# Patient Record
Sex: Male | Born: 1960 | Race: Asian | Hispanic: No | Marital: Married | Smoking: Current every day smoker
Health system: Southern US, Community
[De-identification: ages and names within clinical notes are randomized; demographics above are authoritative.]

## PROBLEM LIST (undated history)

## (undated) DIAGNOSIS — I10 Essential (primary) hypertension: Secondary | ICD-10-CM

## (undated) HISTORY — DX: Essential (primary) hypertension: I10

---

## 2011-05-15 ENCOUNTER — Ambulatory Visit: Payer: Self-pay | Admitting: Internal Medicine

## 2011-05-15 VITALS — BP 142/82 | HR 88 | Temp 98.5°F | Resp 16 | Ht 64.0 in | Wt 125.2 lb

## 2011-05-15 DIAGNOSIS — G250 Essential tremor: Secondary | ICD-10-CM

## 2011-05-15 DIAGNOSIS — M542 Cervicalgia: Secondary | ICD-10-CM

## 2011-05-15 DIAGNOSIS — G252 Other specified forms of tremor: Secondary | ICD-10-CM

## 2011-05-15 MED ORDER — CYCLOBENZAPRINE HCL 10 MG PO TABS: 10.0000 mg | ORAL_TABLET | Freq: Three times a day (TID) | ORAL | Status: AC | PRN

## 2011-05-15 MED ORDER — PREDNISONE 20 MG PO TABS: ORAL_TABLET | ORAL | Status: DC

## 2011-05-15 NOTE — Progress Notes (Signed)
  Subjective:    Patient ID: Gregory Singh, male    DOB: 1961-03-16, 51 y.o.   MRN: [id number]  HPI  Neck strain  Review of Systems     Objective:   Physical Exam Pain neck and thorax       Assessment & Plan:  6 weeks of m-s pain  Prednisone and flexeril Neck care manual RTC prn will xray

## 2011-05-21 ENCOUNTER — Telehealth: Payer: Self-pay

## 2011-05-21 ENCOUNTER — Ambulatory Visit: Payer: Self-pay

## 2011-05-21 ENCOUNTER — Ambulatory Visit: Payer: Self-pay | Admitting: Internal Medicine

## 2011-05-21 DIAGNOSIS — M159 Polyosteoarthritis, unspecified: Secondary | ICD-10-CM

## 2011-05-21 DIAGNOSIS — M25519 Pain in unspecified shoulder: Secondary | ICD-10-CM

## 2011-05-21 DIAGNOSIS — R202 Paresthesia of skin: Secondary | ICD-10-CM

## 2011-05-21 DIAGNOSIS — M542 Cervicalgia: Secondary | ICD-10-CM

## 2011-05-21 DIAGNOSIS — IMO0002 Reserved for concepts with insufficient information to code with codable children: Secondary | ICD-10-CM

## 2011-05-21 DIAGNOSIS — F809 Developmental disorder of speech and language, unspecified: Secondary | ICD-10-CM

## 2011-05-21 MED ORDER — HYDROCODONE-ACETAMINOPHEN 10-325 MG PO TABS: 1.0000 | ORAL_TABLET | Freq: Three times a day (TID) | ORAL | Status: AC | PRN

## 2011-05-21 NOTE — Telephone Encounter (Signed)
None

## 2011-05-21 NOTE — Progress Notes (Signed)
  Subjective:    Patient ID: Gregory Singh, male    DOB: [birth year], 51 y.o.   MRN: [id number]  HPI Right neck pain with radiation and numbness right hand. No improvement yet on prednisone. No weakness   Review of Systems    stable Objective:   Physical Exam  Constitutional: He is oriented to person, place, and time. He appears well-developed and well-nourished.  Cardiovascular: Normal rate and regular rhythm.   Pulmonary/Chest: Effort normal and breath sounds normal.  Musculoskeletal:       Right shoulder: Normal.       Cervical back: He exhibits decreased range of motion, tenderness and pain.  Neurological: He is alert and oriented to person, place, and time. He has normal strength. No sensory deficit.  Reflex Scores:      Tricep reflexes are 2+ on the right side and 2+ on the left side.      Bicep reflexes are 1+ on the right side and 1+ on the left side.    [facility] reading (PRIMARY) by  Dr. Perrin Maltese C-spine. Moderate to severe DDD and spondylosis C-5-6-7.        Assessment & Plan:   Progressive neck pain with right arm/hand pain /numbness.  Finish prednisone Add vicodin 7.5/325 40, 1 QID prn F/up 1-2 weeks

## 2011-05-29 ENCOUNTER — Ambulatory Visit: Payer: Self-pay | Admitting: Internal Medicine

## 2011-05-29 VITALS — BP 138/88 | HR 106 | Temp 98.6°F | Resp 16 | Wt 127.2 lb

## 2011-05-29 DIAGNOSIS — S139XXA Sprain of joints and ligaments of unspecified parts of neck, initial encounter: Secondary | ICD-10-CM

## 2011-05-29 DIAGNOSIS — M542 Cervicalgia: Secondary | ICD-10-CM

## 2011-05-29 MED ORDER — CYCLOBENZAPRINE HCL 10 MG PO TABS: 10.0000 mg | ORAL_TABLET | Freq: Three times a day (TID) | ORAL | Status: AC | PRN

## 2011-05-29 NOTE — Progress Notes (Signed)
  Subjective:    Patient ID: Gregory Singh, male    DOB: [birth year], 51 y.o.   MRN: [id number]  HPI  Neck stain 50% better  Review of Systems     Objective:   Physical Exam  Improved ROM      Assessment & Plan:   Refilled flexeril  Ok if calls to refill vicodin 10/325 or prednisone one more time. No insurance and language issurs

## 2015-01-03 ENCOUNTER — Ambulatory Visit (INDEPENDENT_AMBULATORY_CARE_PROVIDER_SITE_OTHER): Payer: No Typology Code available for payment source | Admitting: Internal Medicine

## 2015-01-03 ENCOUNTER — Ambulatory Visit (INDEPENDENT_AMBULATORY_CARE_PROVIDER_SITE_OTHER): Payer: No Typology Code available for payment source

## 2015-01-03 VITALS — BP 158/88 | HR 81 | Temp 98.3°F | Resp 16 | Ht 66.0 in | Wt 130.0 lb

## 2015-01-03 DIAGNOSIS — R0602 Shortness of breath: Secondary | ICD-10-CM | POA: Diagnosis not present

## 2015-01-03 DIAGNOSIS — R350 Frequency of micturition: Secondary | ICD-10-CM | POA: Diagnosis not present

## 2015-01-03 DIAGNOSIS — Z720 Tobacco use: Secondary | ICD-10-CM | POA: Diagnosis not present

## 2015-01-03 DIAGNOSIS — R21 Rash and other nonspecific skin eruption: Secondary | ICD-10-CM

## 2015-01-03 DIAGNOSIS — F17200 Nicotine dependence, unspecified, uncomplicated: Secondary | ICD-10-CM

## 2015-01-03 DIAGNOSIS — M4712 Other spondylosis with myelopathy, cervical region: Secondary | ICD-10-CM | POA: Diagnosis not present

## 2015-01-03 DIAGNOSIS — I10 Essential (primary) hypertension: Secondary | ICD-10-CM | POA: Diagnosis not present

## 2015-01-03 DIAGNOSIS — R251 Tremor, unspecified: Secondary | ICD-10-CM

## 2015-01-03 LAB — COMPREHENSIVE METABOLIC PANEL
ALK PHOS: 90 U/L (ref 40–115)
ALT: 87 U/L — AB (ref 9–46)
AST: 131 U/L — AB (ref 10–35)
Albumin: 4.6 g/dL (ref 3.6–5.1)
BILIRUBIN TOTAL: 1.2 mg/dL (ref 0.2–1.2)
BUN: 8 mg/dL (ref 7–25)
CO2: 22 mmol/L (ref 20–31)
CREATININE: 0.71 mg/dL (ref 0.70–1.33)
Calcium: 9.5 mg/dL (ref 8.6–10.3)
Chloride: 106 mmol/L (ref 98–110)
GLUCOSE: 108 mg/dL — AB (ref 65–99)
Potassium: 3.9 mmol/L (ref 3.5–5.3)
Sodium: 140 mmol/L (ref 135–146)
TOTAL PROTEIN: 7.7 g/dL (ref 6.1–8.1)

## 2015-01-03 LAB — LIPID PANEL
CHOL/HDL RATIO: 5 ratio (ref <=5.0)
CHOLESTEROL: 179 mg/dL (ref 125–200)
HDL: 36 mg/dL — AB (ref >=40)
Triglycerides: 517 mg/dL — ABNORMAL HIGH (ref <150)

## 2015-01-03 LAB — POCT CBC
GRANULOCYTE PERCENT: 56.3 % (ref 37–80)
HEMATOCRIT: 44.6 % (ref 43.5–53.7)
HEMOGLOBIN: 16.2 g/dL (ref 14.1–18.1)
Lymph, poc: 2.3 (ref 0.6–3.4)
MCH, POC: 35.5 pg — AB (ref 27–31.2)
MCHC: 36.5 g/dL — AB (ref 31.8–35.4)
MCV: 97.3 fL — AB (ref 80–97)
MID (CBC): 0.4 (ref 0–0.9)
MPV: 8.2 fL (ref 0–99.8)
POC Granulocyte: 3.4 (ref 2–6.9)
POC LYMPH PERCENT: 37.2 %L (ref 10–50)
POC MID %: 6.5 % (ref 0–12)
Platelet Count, POC: 104 10*3/uL — AB (ref 142–424)
RBC: 4.58 M/uL — AB (ref 4.69–6.13)
RDW, POC: 13.2 %
WBC: 6.1 10*3/uL (ref 4.6–10.2)

## 2015-01-03 LAB — POCT URINALYSIS DIP (MANUAL ENTRY)
BILIRUBIN UA: NEGATIVE
Bilirubin, UA: NEGATIVE
GLUCOSE UA: NEGATIVE
Leukocytes, UA: NEGATIVE
Nitrite, UA: NEGATIVE
Protein Ur, POC: NEGATIVE
RBC UA: NEGATIVE
SPEC GRAV UA: 1.02
UROBILINOGEN UA: 1
pH, UA: 6

## 2015-01-03 LAB — POC MICROSCOPIC URINALYSIS (UMFC)

## 2015-01-03 LAB — HEPATITIS C ANTIBODY: HCV Ab: NEGATIVE

## 2015-01-03 LAB — TSH: TSH: 2.276 u[IU]/mL (ref 0.350–4.500)

## 2015-01-03 LAB — POCT SEDIMENTATION RATE: POCT SED RATE: 6 mm/hr (ref 0–22)

## 2015-01-03 LAB — VITAMIN B12: VITAMIN B 12: 532 pg/mL (ref 211–911)

## 2015-01-03 MED ORDER — CLOBETASOL PROPIONATE 0.05 % EX OINT: TOPICAL_OINTMENT | CUTANEOUS | Status: DC

## 2015-01-03 MED ORDER — METOPROLOL SUCCINATE ER 50 MG PO TB24: 50.0000 mg | ORAL_TABLET | Freq: Every day | ORAL | Status: DC

## 2015-01-03 NOTE — Patient Instructions (Addendum)
Smoking Cessation, Tips for Success If you are ready to quit smoking, congratulations! You have chosen to help yourself be healthier. Cigarettes bring nicotine, tar, carbon monoxide, and other irritants into your body. Your lungs, heart, and blood vessels will be able to work better without these poisons. There are many different ways to quit smoking. Nicotine gum, nicotine patches, a nicotine inhaler, or nicotine nasal spray can help with physical craving. Hypnosis, support groups, and medicines help break the habit of smoking. WHAT THINGS CAN I DO TO MAKE QUITTING EASIER?  Here are some tips to help you quit for good:  Pick a date when you will quit smoking completely. Tell all of your friends and family about your plan to quit on that date.  Do not try to slowly cut down on the number of cigarettes you are smoking. Pick a quit date and quit smoking completely starting on that day.  Throw away all cigarettes.   Clean and remove all ashtrays from your home, work, and car.  On a card, write down your reasons for quitting. Carry the card with you and read it when you get the urge to smoke.  Cleanse your body of nicotine. Drink enough water and fluids to keep your urine clear or pale yellow. Do this after quitting to flush the nicotine from your body.  Learn to predict your moods. Do not let a bad situation be your excuse to have a cigarette. Some situations in your life might tempt you into wanting a cigarette.  Never have "just one" cigarette. It leads to wanting another and another. Remind yourself of your decision to quit.  Change habits associated with smoking. If you smoked while driving or when feeling stressed, try other activities to replace smoking. Stand up when drinking your coffee. Brush your teeth after eating. Sit in a different chair when you read the paper. Avoid alcohol while trying to quit, and try to drink fewer caffeinated beverages. Alcohol and caffeine may urge you to  smoke.  Avoid foods and drinks that can trigger a desire to smoke, such as sugary or spicy foods and alcohol.  Ask people who smoke not to smoke around you.  Have something planned to do right after eating or having a cup of coffee. For example, plan to take a walk or exercise.  Try a relaxation exercise to calm you down and decrease your stress. Remember, you may be tense and nervous for the first 2 weeks after you quit, but this will pass.  Find new activities to keep your hands busy. Play with a pen, coin, or rubber band. Doodle or draw things on paper.  Brush your teeth right after eating. This will help cut down on the craving for the taste of tobacco after meals. You can also try mouthwash.   Use oral substitutes in place of cigarettes. Try using lemon drops, carrots, cinnamon sticks, or chewing gum. Keep them handy so they are available when you have the urge to smoke.  When you have the urge to smoke, try deep breathing.  Designate your home as a nonsmoking area.  If you are a heavy smoker, ask your health care provider about a prescription for nicotine chewing gum. It can ease your withdrawal from nicotine.  Reward yourself. Set aside the cigarette money you save and buy yourself something nice.  Look for support from others. Join a support group or smoking cessation program. Ask someone at home or at work to help you with your plan   to quit smoking.  Always ask yourself, "Do I need this cigarette or is this just a reflex?" Tell yourself, "Today, I choose not to smoke," or "I do not want to smoke." You are reminding yourself of your decision to quit.  Do not replace cigarette smoking with electronic cigarettes (commonly called e-cigarettes). The safety of e-cigarettes is unknown, and some may contain harmful chemicals.  If you relapse, do not give up! Plan ahead and think about what you will do the next time you get the urge to smoke. HOW WILL I FEEL WHEN I QUIT SMOKING? You  may have symptoms of withdrawal because your body is used to nicotine (the addictive substance in cigarettes). You may crave cigarettes, be irritable, feel very hungry, cough often, get headaches, or have difficulty concentrating. The withdrawal symptoms are only temporary. They are strongest when you first quit but will go away within 10-14 days. When withdrawal symptoms occur, stay in control. Think about your reasons for quitting. Remind yourself that these are signs that your body is healing and getting used to being without cigarettes. Remember that withdrawal symptoms are easier to treat than the major diseases that smoking can cause.  Even after the withdrawal is over, expect periodic urges to smoke. However, these cravings are generally short lived and will go away whether you smoke or not. Do not smoke! WHAT RESOURCES ARE AVAILABLE TO HELP ME QUIT SMOKING? Your health care provider can direct you to community resources or hospitals for support, which may include:  Group support.  Education.  Hypnosis.  Therapy.   This information is not intended to replace advice given to you by your health care provider. Make sure you discuss any questions you have with your health care provider.   Document Released: 12/02/2003 Document Revised: 03/26/2014 Document Reviewed: 08/21/2012 Elsevier Interactive Patient Education 2016 [employer]. Compensation Strategies for Tremors  When eating, try the following  Eat out of bowls, divided plates, or use a plate guard (available at a medical supply store) and eat with a spoon so that you have an edge to scoop up food.  Try raising your plate so that there is less distance between the plate and mouth.Try stabilizing elbows on the tables or against your body.  Use utensil with built-up/larger grips as they are easier to hold.  When writing, try the following:  Stabilize forearm on the table.  Take your time as rushing/being stressed can increase  tremors.  Try a felt-tipped pen, it does not glide as much.  Avoid gel pens ( they move to much ).  Consider using pre-printed labels with your name and address (carry them with you when you go out) or you can get stamps with your address or signature on it.  Use a small tape recorder to record messages/reminders for yourself.  Use pens with bigger grips.  When brushing your teeth, putting on make-up, or styling hair, try the following:  Use an electric toothbrush.  Use items with built-up grips.  Stabilize your elbows against your body or on the counter.  Use long-handled brushes/combs.  Use a hair dryer with a stand.  In general:  Avoid stress, fatigue or rushing as this can increase tremors.  Sit down for activities that require more control/coordination.  Perform "flicks". Tremor A tremor is trembling or shaking that you cannot control. Most tremors affect the hands or arms. Tremors can also affect the head, vocal cords, face, and other parts of the body. There are many types  of tremors. Common types include:   Essential tremor. These usually occur in people over the age of 52. It may run in families and can happen in otherwise healthy people.   Resting tremor. These occur when the muscles are at rest, such as when your hands are resting in your lap. People with Parkinson disease often have resting tremors.   Postural tremor. These occur when you try to hold a pose, such as keeping your hands outstretched.   Kinetic tremor. These occur during purposeful movement, such as trying to touch a finger to your nose.   Task-specific tremor. These may occur when you perform tasks such as handwriting, speaking, or standing.   Psychogenic tremor. These dramatically lessen or disappear when you are distracted. They can happen in people of all ages.  Some types of tremors have no known cause. Tremors can also be a symptom of nervous system problems (neurological disorders)  that may occur with aging. Some tremors go away with treatment while others do not.  HOME CARE INSTRUCTIONS Watch your tremor for any changes. The following actions may help to lessen any discomfort you are feeling:  Take medicines only as directed by your health care provider.   Limit alcohol intake to no more than 1 drink per day for nonpregnant women and 2 drinks per day for men. One drink equals 12 oz of beer, 5 oz of wine, or 1 oz of hard liquor.  Do not use any tobacco products, including cigarettes, chewing tobacco, or electronic cigarettes. If you need help quitting, ask your health care provider.   Avoid extreme heat or cold.   Limit the amount of caffeine you consumeas directed by your health care provider.   Try to get 8 hours of sleep each night.  Find ways to manage your stress, such as meditation or yoga.  Keep all follow-up visits as directed by your health care provider. This is important. SEEK MEDICAL CARE IF:  You start having a tremor after starting a new medicine.  You have tremor with other symptoms such as:  Numbness.  Tingling.  Pain.  Weakness.  Your tremor gets worse.  Your tremor interferes with your day-to-day life.   This information is not intended to replace advice given to you by your health care provider. Make sure you discuss any questions you have with your health care provider.   Document Released: 02/23/2002 Document Revised: 03/26/2014 Document Reviewed: 08/31/2013 Elsevier Interactive Patient Education 2016 Else[employer] Inc[employer] DASH Eating Plan DASH stands for "Dietary Approaches to Stop Hypertension." The DASH eating plan is a healthy eating plan that has been shown to reduce high blood pressure (hypertension). Additional health benefits may include reducing the risk of type 2 diabetes mellitus, heart disease, and stroke. The DASH eating plan may also help with weight loss. WHAT DO I NEED TO KNOW ABOUT THE DASH EATING PLAN? For the  DASH eating plan, you will follow these general guidelines:  Choose foods with a percent daily value for sodium of less than 5% (as listed on the food label).  Use salt-free seasonings or herbs instead of table salt or sea salt.  Check with your health care provider or pharmacist before using salt substitutes.  Eat lower-sodium products, often labeled as "lower sodium" or "no salt added."  Eat fresh foods.  Eat more vegetables, fruits, and low-fat dairy products.  Choose whole grains. Look for the word "whole" as the first word in the ingredient list.  Choose fish and skinless chicken  or [place] more often than red meat. Limit fish, poultry, and meat to 6 oz (170 g) each day.  Limit sweets, desserts, sugars, and sugary drinks.  Choose heart-healthy fats.  Limit cheese to 1 oz (28 g) per day.  Eat more home-cooked food and less restaurant, buffet, and fast food.  Limit fried foods.  Cook foods using methods other than frying.  Limit canned vegetables. If you do use them, rinse them well to decrease the sodium.  When eating at a restaurant, ask that your food be prepared with less salt, or no salt if possible. WHAT FOODS CAN I EAT? Seek help from a dietitian for individual calorie needs. Grains Whole grain or whole wheat bread. Brown rice. Whole grain or whole wheat pasta. Quinoa, bulgur, and whole grain cereals. Low-sodium cereals. Corn or whole wheat flour tortillas. Whole grain cornbread. Whole grain crackers. Low-sodium crackers. Vegetables Fresh or frozen vegetables (raw, steamed, roasted, or grilled). Low-sodium or reduced-sodium tomato and vegetable juices. Low-sodium or reduced-sodium tomato sauce and paste. Low-sodium or reduced-sodium canned vegetables.  Fruits All fresh, canned (in natural juice), or frozen fruits. Meat and Other Protein Products Ground beef (85% or leaner), grass-fed beef, or beef trimmed of fat. Skinless chicken or [place]. Ground chicken or [place].  Pork trimmed of fat. All fish and seafood. Eggs. Dried beans, peas, or lentils. Unsalted nuts and seeds. Unsalted canned beans. Dairy Low-fat dairy products, such as skim or 1% milk, 2% or reduced-fat cheeses, low-fat ricotta or cottage cheese, or plain low-fat yogurt. Low-sodium or reduced-sodium cheeses. Fats and Oils Tub margarines without trans fats. Light or reduced-fat mayonnaise and salad dressings (reduced sodium). Avocado. Safflower, olive, or canola oils. Natural peanut or almond butter. Other Unsalted popcorn and pretzels. The items listed above may not be a complete list of recommended foods or beverages. Contact your dietitian for more options. WHAT FOODS ARE NOT RECOMMENDED? Grains White bread. White pasta. White rice. Refined cornbread. Bagels and croissants. Crackers that contain trans fat. Vegetables Creamed or fried vegetables. Vegetables in a cheese sauce. Regular canned vegetables. Regular canned tomato sauce and paste. Regular tomato and vegetable juices. Fruits Dried fruits. Canned fruit in light or heavy syrup. Fruit juice. Meat and Other Protein Products Fatty cuts of meat. Ribs, chicken wings, bacon, sausage, bologna, salami, chitterlings, fatback, hot dogs, bratwurst, and packaged luncheon meats. Salted nuts and seeds. Canned beans with salt. Dairy Whole or 2% milk, cream, half-and-half, and cream cheese. Whole-fat or sweetened yogurt. Full-fat cheeses or blue cheese. Nondairy creamers and whipped toppings. Processed cheese, cheese spreads, or cheese curds. Condiments Onion and garlic salt, seasoned salt, table salt, and sea salt. Canned and packaged gravies. Worcestershire sauce. Tartar sauce. Barbecue sauce. Teriyaki sauce. Soy sauce, including reduced sodium. Steak sauce. Fish sauce. Oyster sauce. Cocktail sauce. Horseradish. Ketchup and mustard. Meat flavorings and tenderizers. Bouillon cubes. Hot sauce. Tabasco sauce. Marinades. Taco seasonings. Relishes. Fats and  Oils Butter, stick margarine, lard, shortening, ghee, and bacon fat. Coconut, palm kernel, or palm oils. Regular salad dressings. Other Pickles and olives. Salted popcorn and pretzels. The items listed above may not be a complete list of foods and beverages to avoid. Contact your dietitian for more information. WHERE CAN I FIND MORE INFORMATION? National Heart, Lung, and Blood Institute: [employer]   This information is not intended to replace advice given to you by your health care provider. Make sure you discuss any questions you have with your health care provider.   Document Released: 02/22/2011 Document Revised:  03/26/2014 Document Reviewed: 01/07/2013 Elsevier Interactive Patient Education 2016 [employer]. Hypertension Hypertension, commonly called high blood pressure, is when the force of blood pumping through your arteries is too strong. Your arteries are the blood vessels that carry blood from your heart throughout your body. A blood pressure reading consists of a higher number over a lower number, such as 110/72. The higher number (systolic) is the pressure inside your arteries when your heart pumps. The lower number (diastolic) is the pressure inside your arteries when your heart relaxes. Ideally you want your blood pressure below 120/80. Hypertension forces your heart to work harder to pump blood. Your arteries may become narrow or stiff. Having untreated or uncontrolled hypertension can cause heart attack, stroke, kidney disease, and other problems. RISK FACTORS Some risk factors for high blood pressure are controllable. Others are not.  Risk factors you cannot control include:   Race. You may be at higher risk if you are African American.  Age. Risk increases with age.  Gender. Men are at higher risk than women before age 68 years. After age 85, women are at higher risk than men. Risk factors you can control include:  Not getting  enough exercise or physical activity.  Being overweight.  Getting too much fat, sugar, calories, or salt in your diet.  Drinking too much alcohol. SIGNS AND SYMPTOMS Hypertension does not usually cause signs or symptoms. Extremely high blood pressure (hypertensive crisis) may cause headache, anxiety, shortness of breath, and nosebleed. DIAGNOSIS To check if you have hypertension, your health care provider will measure your blood pressure while you are seated, with your arm held at the level of your heart. It should be measured at least twice using the same arm. Certain conditions can cause a difference in blood pressure between your right and left arms. A blood pressure reading that is higher than normal on one occasion does not mean that you need treatment. If it is not clear whether you have high blood pressure, you may be asked to return on a different day to have your blood pressure checked again. Or, you may be asked to monitor your blood pressure at home for 1 or more weeks. TREATMENT Treating high blood pressure includes making lifestyle changes and possibly taking medicine. Living a healthy lifestyle can help lower high blood pressure. You may need to change some of your habits. Lifestyle changes may include:  Following the DASH diet. This diet is high in fruits, vegetables, and whole grains. It is low in salt, red meat, and added sugars.  Keep your sodium intake below 2,300 mg per day.  Getting at least 30-45 minutes of aerobic exercise at least 4 times per week.  Losing weight if necessary.  Not smoking.  Limiting alcoholic beverages.  Learning ways to reduce stress. Your health care provider may prescribe medicine if lifestyle changes are not enough to get your blood pressure under control, and if one of the following is true:  You are 31-81 years of age and your systolic blood pressure is above 140.  You are 51 years of age or older, and your systolic blood pressure is  above 150.  Your diastolic blood pressure is above 90.  You have diabetes, and your systolic blood pressure is over 140 or your diastolic blood pressure is over 90.  You have kidney disease and your blood pressure is above 140/90.  You have heart disease and your blood pressure is above 140/90. Your personal target blood pressure may vary depending  on your medical conditions, your age, and other factors. HOME CARE INSTRUCTIONS  Have your blood pressure rechecked as directed by your health care provider.   Take medicines only as directed by your health care provider. Follow the directions carefully. Blood pressure medicines must be taken as prescribed. The medicine does not work as well when you skip doses. Skipping doses also puts you at risk for problems.  Do not smoke.   Monitor your blood pressure at home as directed by your health care provider. SEEK MEDICAL CARE IF:   You think you are having a reaction to medicines taken.  You have recurrent headaches or feel dizzy.  You have swelling in your ankles.  You have trouble with your vision. SEEK IMMEDIATE MEDICAL CARE IF:  You develop a severe headache or confusion.  You have unusual weakness, numbness, or feel faint.  You have severe chest or abdominal pain.  You vomit repeatedly.  You have trouble breathing. MAKE SURE YOU:   Understand these instructions.  Will watch your condition.  Will get help right away if you are not doing well or get worse.   This information is not intended to replace advice given to you by your health care provider. Make sure you discuss any questions you have with your health care provider.   Document Released: 03/05/2005 Document Revised: 07/20/2014 Document Reviewed: 12/26/2012 Elsevier Interactive Patient Education [employer]

## 2015-01-03 NOTE — Progress Notes (Signed)
Patient ID: Gregory Singh, male   DOB: 1961/01/19, 54 y.o.   MRN: [id number]   01/03/2015 at 11:08 AM  Gregory Singh / DOB: 1960-06-13 / MRN: [id number]  Problem list reviewed and updated by me where necessary.   SUBJECTIVE  Gregory Singh is a 54 y.o. well appearing male presenting for the chief complaint of tremor both hands, he has not seen a doctor in 3 years, he is smoker and also co rash on right knee for years, itchy and thick. He wants his blood checked..     He  has no past medical history on file.    Medications reviewed and updated by myself where necessary, and exist elsewhere in the encounter.   Gregory Singh is allergic to ibuprofen. He  reports that he has been smoking Cigarettes.  He has been smoking about 0.50 packs per day. He has never used smokeless tobacco. He reports that he drinks about 14.4 oz of alcohol per week. He reports that he does not use illicit drugs. He  has no sexual activity history on file. The patient  has no past surgical history on file.  His family history includes Diabetes in his father; Stroke in his father.  Review of Systems  Constitutional: Negative for fever.  Respiratory: Negative for shortness of breath.   Cardiovascular: Negative for chest pain.  Gastrointestinal: Negative for nausea.  Skin: Negative for rash.  Neurological: Negative for dizziness and headaches.    OBJECTIVE  His  height is  (1.676 m) and weight is 130 lb (58.968 kg). His oral temperature is 98.3 F (36.8 C). His blood pressure is 158/88 and his pulse is 81. His respiration is 16 and oxygen saturation is 98%.  The patient's body mass index is 20.99 kg/(m^2).  Physical Exam  Constitutional: He is oriented to person, place, and time. He appears well-developed and well-nourished. No distress.  HENT:  Head: Normocephalic.  Nose: Nose normal.  Mouth/Throat: Oropharynx is clear and moist.  Eyes: Conjunctivae and EOM are normal.  Neck: Normal range of motion. Neck supple. No tracheal  deviation present. No thyromegaly present.  Cardiovascular: Regular rhythm, S1 normal, S2 normal and normal heart sounds.   No extrasystoles are present. Tachycardia present.   No murmur heard. Respiratory: Effort normal. No tachypnea. No respiratory distress. He has decreased breath sounds. He has no wheezes. He has no rhonchi. He exhibits no tenderness.  Lymphadenopathy:    He has no cervical adenopathy.  Neurological: He is alert and oriented to person, place, and time. He has normal strength. He displays tremor. No cranial nerve deficit or sensory deficit. He exhibits normal muscle tone. Coordination and gait normal.  Balance and drift normal over10 seconds  Skin: Skin is intact. Lesion and rash noted. Rash is nodular.     Keratotic brown and red plaque, irregular  Psychiatric: He has a normal mood and affect.   158/88  [facility] reading (PRIMARY) by  Dr.Lizza Huffaker cxr has increased markings, scarring.    Results for orders placed or performed in visit on 01/03/15 (from the past 24 hour(s))  POCT CBC     Status: Abnormal   Collection Time: 01/03/15 10:41 AM  Result Value Ref Range   WBC 6.1 4.6 - 10.2 K/uL   Lymph, poc 2.3 0.6 - 3.4   POC LYMPH PERCENT 37.2 10 - 50 %L   MID (cbc) 0.4 0 - 0.9   POC MID % 6.5 0 - 12 %M   POC Granulocyte 3.4 2 -  6.9   Granulocyte percent 56.3 37 - 80 %G   RBC 4.58 (A) 4.69 - 6.13 M/uL   Hemoglobin 16.2 14.1 - 18.1 g/dL   HCT, POC 65.7 84.6 - 53.7 %   MCV 97.3 (A) 80 - 97 fL   MCH, POC 35.5 (A) 27 - 31.2 pg   MCHC 36.5 (A) 31.8 - 35.4 g/dL   RDW, POC 96.2 %   Platelet Count, POC 104 (A) 142 - 424 K/uL   MPV 8.2 0 - 99.8 fL  POCT urinalysis dipstick     Status: Abnormal   Collection Time: 01/03/15 10:43 AM  Result Value Ref Range   Color, UA orange (A) yellow   Clarity, UA clear clear   Glucose, UA negative negative   Bilirubin, UA negative negative   Ketones, POC UA negative negative   Spec Grav, UA 1.020    Blood, UA negative negative   pH,  UA 6.0    Protein Ur, POC negative negative   Urobilinogen, UA 1.0    Nitrite, UA Negative Negative   Leukocytes, UA Negative Negative  POCT Microscopic Urinalysis ([facility])     Status: Abnormal   Collection Time: 01/03/15 10:43 AM  Result Value Ref Range   WBC,UR,HPF,POC None None WBC/hpf   RBC,UR,HPF,POC None None RBC/hpf   Bacteria None None   Mucus Present (A) Absent   Epithelial Cells, UR Per Microscopy Moderate (A) None cells/hpf    ASSESSMENT & PLAN  Gregory Singh was seen today for dizziness, shaking in fingers, shortness of breath, urinary frequency and rash.  Diagnoses and all orders for this visit:  Smoker -     POCT CBC -     POCT SEDIMENTATION RATE -     POCT urinalysis dipstick -     POCT Microscopic Urinalysis ([facility]) -     Comprehensive metabolic panel -     PSA -     TSH -     Vitamin B12 -     Lipid panel -     HIV antibody -     Hepatitis C antibody -     DG Chest 2 View; Future -     EKG 12-Lead  Tremor of both hands -     POCT CBC -     POCT SEDIMENTATION RATE -     POCT urinalysis dipstick -     POCT Microscopic Urinalysis ([facility]) -     Comprehensive metabolic panel -     PSA -     TSH -     Vitamin B12 -     Lipid panel -     HIV antibody -     Hepatitis C antibody -     DG Chest 2 View; Future -     EKG 12-Lead  Spondylosis, cervical, with myelopathy -     POCT CBC -     POCT SEDIMENTATION RATE -     POCT urinalysis dipstick -     POCT Microscopic Urinalysis ([facility]) -     Comprehensive metabolic panel -     PSA -     TSH -     Vitamin B12 -     Lipid panel -     HIV antibody -     Hepatitis C antibody -     DG Chest 2 View; Future -     EKG 12-Lead  Rash and nonspecific skin eruption -     POCT CBC -  POCT SEDIMENTATION RATE -     POCT urinalysis dipstick -     POCT Microscopic Urinalysis ([facility]) -     Comprehensive metabolic panel -     PSA -     TSH -     Vitamin B12 -     Lipid panel -     HIV antibody -     Hepatitis C  antibody -     DG Chest 2 View; Future -     EKG 12-Lead  SOB (shortness of breath) -     EKG 12-Lead  Frequency of micturition -     EKG 12-Lead   EKG is normal

## 2015-01-04 LAB — HIV ANTIBODY (ROUTINE TESTING W REFLEX): HIV: NONREACTIVE

## 2015-01-04 LAB — PSA: PSA: 0.58 ng/mL (ref <=4.00)

## 2015-01-10 ENCOUNTER — Ambulatory Visit (INDEPENDENT_AMBULATORY_CARE_PROVIDER_SITE_OTHER): Payer: No Typology Code available for payment source | Admitting: Internal Medicine

## 2015-01-10 VITALS — BP 116/80 | HR 69 | Temp 98.5°F | Resp 16 | Ht 66.0 in | Wt 130.0 lb

## 2015-01-10 DIAGNOSIS — E781 Pure hyperglyceridemia: Secondary | ICD-10-CM | POA: Diagnosis not present

## 2015-01-10 DIAGNOSIS — D696 Thrombocytopenia, unspecified: Secondary | ICD-10-CM | POA: Insufficient documentation

## 2015-01-10 DIAGNOSIS — R251 Tremor, unspecified: Secondary | ICD-10-CM | POA: Insufficient documentation

## 2015-01-10 DIAGNOSIS — E782 Mixed hyperlipidemia: Secondary | ICD-10-CM | POA: Diagnosis not present

## 2015-01-10 DIAGNOSIS — K759 Inflammatory liver disease, unspecified: Secondary | ICD-10-CM

## 2015-01-10 LAB — POCT CBC
Granulocyte percent: 56.8 %G (ref 37–80)
HCT, POC: 44.2 % (ref 43.5–53.7)
Hemoglobin: 15.6 g/dL (ref 14.1–18.1)
LYMPH, POC: 2 (ref 0.6–3.4)
MCH, POC: 35.4 pg — AB (ref 27–31.2)
MCHC: 35.2 g/dL (ref 31.8–35.4)
MCV: 100.5 fL — AB (ref 80–97)
MID (CBC): 0.4 (ref 0–0.9)
MPV: 7.6 fL (ref 0–99.8)
PLATELET COUNT, POC: 111 10*3/uL — AB (ref 142–424)
POC Granulocyte: 3.2 (ref 2–6.9)
POC LYMPH %: 36.6 % (ref 10–50)
POC MID %: 6.6 %M (ref 0–12)
RBC: 4.4 M/uL — AB (ref 4.69–6.13)
RDW, POC: 13 %
WBC: 5.6 10*3/uL (ref 4.6–10.2)

## 2015-01-10 LAB — HEPATIC FUNCTION PANEL
ALBUMIN: 4.3 g/dL (ref 3.6–5.1)
ALK PHOS: 77 U/L (ref 40–115)
ALT: 80 U/L — AB (ref 9–46)
AST: 115 U/L — AB (ref 10–35)
Bilirubin, Direct: 0.2 mg/dL (ref <=0.2)
Indirect Bilirubin: 0.7 mg/dL (ref 0.2–1.2)
TOTAL PROTEIN: 7.4 g/dL (ref 6.1–8.1)
Total Bilirubin: 0.9 mg/dL (ref 0.2–1.2)

## 2015-01-10 LAB — TRIGLYCERIDES: Triglycerides: 432 mg/dL — ABNORMAL HIGH (ref <150)

## 2015-01-10 NOTE — Patient Instructions (Addendum)
Thrombocytopenia Thrombocytopenia is a condition in which there is an abnormally small number of platelets in your blood. Platelets are also called thrombocytes. Platelets are needed for blood clotting. CAUSES Thrombocytopenia is caused by:   Decreased production of platelets. This can be caused by:  Aplastic anemia in which your bone marrow quits making blood cells.  Cancer in the bone marrow.  Use of certain medicines, including chemotherapy.  Infection in the bone marrow.  Heavy alcohol consumption.  Increased destruction of platelets. This can be caused by:  Certain immune diseases.  Use of certain drugs.  Certain blood clotting disorders.  Certain inherited disorders.  Certain bleeding disorders.  Pregnancy.  Having an enlarged spleen (hypersplenism). In hypersplenism, the spleen gathers up platelets from circulation. This means the platelets are not available to help with blood clotting. The spleen can enlarge due to cirrhosis or other conditions. SYMPTOMS  The symptoms of thrombocytopenia are side effects of poor blood clotting. Some of these are:  Abnormal bleeding.  Nosebleeds.  Heavy menstrual periods.  Blood in the urine or stools.  Purpura. This is a purplish discoloration in the skin produced by small bleeding vessels near the surface of the skin.  Bruising.  A rash that may be petechial. This looks like pinpoint, purplish-red spots on the skin and mucous membranes. It is caused by bleeding from small blood vessels (capillaries). DIAGNOSIS  Your caregiver will make this diagnosis based on your exam and blood tests. Sometimes, a bone marrow study is done to look for the original cells (megakaryocytes) that make platelets. TREATMENT  Treatment depends on the cause of the condition.  Medicines may be given to help protect your platelets from being destroyed.  In some cases, a replacement (transfusion) of platelets may be required to stop or prevent  bleeding.  Sometimes, the spleen must be surgically removed. HOME CARE INSTRUCTIONS   Check the skin and linings inside your mouth for bruising or bleeding as directed by your caregiver.  Check your sputum, urine, and stool for blood as directed by your caregiver.  Do not return to any activities that could cause bumps or bruises until your caregiver says it is okay.  Take extra care not to cut yourself when shaving or when using scissors, needles, knives, and other tools.  Take extra care not to burn yourself when ironing or cooking.  Ask your caregiver if it is okay for you to drink alcohol.  Only take over-the-counter or prescription medicines as directed by your caregiver.  Notify all your caregivers, including dentists and eye doctors, about your condition. SEEK IMMEDIATE MEDICAL CARE IF:   You develop active bleeding from anywhere in your body.  You develop unexplained bruising or bleeding.  You have blood in your sputum, urine, or stool. MAKE SURE YOU:  Understand these instructions.  Will watch your condition.  Will get help right away if you are not doing well or get worse.   This information is not intended to replace advice given to you by your health care provider. Make sure you discuss any questions you have with your health care provider.   Document Released: 03/05/2005 Document Revised: 05/28/2011 Document Reviewed: 09/06/2014 Elsevier Interactive Patient Education 2016 [employer] Alcoholic Hepatitis Alcoholic hepatitis is liver inflammation caused by drinking alcohol. This inflammation decreases the liver's ability to function normally.  CAUSES  Alcoholic hepatitis is caused by heavy drinking.  RISK FACTORS You may have an increased risk of alcoholic hepatitis if:   You drink large amounts  of alcohol.  You have been drinking heavily for years.  You binge drink.  You are male.  You are obese.  You have had infectious hepatitis.  You are  malnourished.  You have close family members who have had alcoholic hepatitis. SIGNS AND SYMPTOMS  Abdominal pain.  A swollen abdomen.  Loss of appetite.  Unintentional weight loss.  Nausea and vomiting.  Tiredness.  Dry mouth.  Severe thirst.  A yellow tone to the skin and whites of the eyes (jaundice).  Spidery veins, especially across the skin of the abdomen.  Unusual bleeding.  Itching.  Trouble thinking clearly.  Memory problems.  Mood changes.  Confusion.  Numbness and tingling in the feet and legs. DIAGNOSIS  Alcoholic hepatitis is diagnosed with blood tests that show problems with liver function. Additional tests may also be done, such as:  An ultrasound.  A CT scan.  An MRI scan.  A liver biopsy. For this test, a small sample of the liver will be taken and examined for evidence of liver damage. TREATMENT The most important step you can take to treat alcoholic hepatitis is to stop drinking alcohol. If you are addicted to alcohol, your health care provider will help you create a plan to quit. It may involve:  Taking medicine to decrease withdrawal symptoms.  Entering a program to help you stop drinking.  Joining a support group. Additional treatment for alcoholic hepatitis may include:  Medicines such as steroids. The medicines will help decrease the inflammation.  Diet. Your health care provider might ask you to undergo nutritional counseling and follow a diet. You may also need to take dietary supplements and vitamins.  A liver transplant. This procedure is performed in very severe cases. It is only performed on people who have totally stopped drinking and can commit to never drinking alcohol again. HOME CARE INSTRUCTIONS  Do not drink alcohol.  Do not use medicines or eat foods that contain alcohol.  Take medicines only as directed by your health care provider.  Follow dietary instructions carefully.  Keep all follow-up visits as  directed by your health care provider. This is important. SEEK MEDICAL CARE IF:  You have a fever.  You do not have your usual appetite.  You have flu-like symptoms such as fatigue, weakness, or muscle aches.  You feel nauseous or vomit.  You bruise easily.  Your urine is very dark.  You have new abdominal pain. SEEK IMMEDIATE MEDICAL CARE IF:  There is blood in your vomit.  You develop jaundice.  Your skin itches severely.  Your legs swell.  Your stomach appears bloated.  You have black, tarry-appearing stools.  You bleed easily.  You are confused or not thinking clearly.  You have a seizure. MAKE SURE YOU:  Understand these instructions.  Will watch your condition.  Will get help right away if you are not doing well or get worse.   This information is not intended to replace advice given to you by your health care provider. Make sure you discuss any questions you have with your health care provider.   Document Released: 09/30/2013 Document Reviewed: 09/30/2013 Elsevier Interactive Patient Education [employer]

## 2015-01-10 NOTE — Progress Notes (Signed)
Patient ID: Gregory Singh, male   DOB: Dec 23, 1960, 54 y.o.   MRN: [id number]   01/10/2015 at 9:35 AM  Gregory Singh / DOB: June 18, 1960 / MRN: [id number]  Problem list reviewed and updated by me where necessary.   SUBJECTIVE  Gregory Singh is a 54 y.o. well appearing male presenting for the chief complaint of feeling better. Tremor is mostly gone, HTN is controlled and TSH is normal. His lab results show mild elevation of LFTs, moderate elevation of triglycerides, and thrombocytopenia. Hep C test is negative..     He  has no past medical history on file.    Medications reviewed and updated by myself where necessary, and exist elsewhere in the encounter.   Gregory Singh is allergic to ibuprofen. He  reports that he has been smoking Cigarettes.  He has been smoking about 0.50 packs per day. He has never used smokeless tobacco. He reports that he drinks about 14.4 oz of alcohol per week. He reports that he does not use illicit drugs. He  has no sexual activity history on file. The patient  has no past surgical history on file.  His family history includes Diabetes in his father; Stroke in his father.  Review of Systems  Constitutional: Negative for fever, weight loss and malaise/fatigue.  Respiratory: Negative for shortness of breath.   Cardiovascular: Negative for chest pain.  Gastrointestinal: Negative for nausea, vomiting and abdominal pain.  Skin: Negative for itching and rash.  Neurological: Positive for tremors. Negative for dizziness, focal weakness and headaches.  Endo/Heme/Allergies: Does not bruise/bleed easily.    OBJECTIVE  His  height is  (1.676 m) and weight is 130 lb (58.968 kg). His oral temperature is 98.5 F (36.9 C). His blood pressure is 116/80 and his pulse is 69. His respiration is 16 and oxygen saturation is 98%.  The patient's body mass index is 20.99 kg/(m^2).  Physical Exam  Constitutional: He is oriented to person, place, and time. He appears well-developed and well-nourished. No  distress.  HENT:  Head: Normocephalic.  Nose: Nose normal.  Eyes: Conjunctivae and EOM are normal. Pupils are equal, round, and reactive to light.  Neck: Normal range of motion.  Cardiovascular: Normal rate, regular rhythm and normal heart sounds.   Respiratory: Effort normal and breath sounds normal. He exhibits no tenderness.  GI: He exhibits no mass. There is no tenderness.  Neurological: He is alert and oriented to person, place, and time. No cranial nerve deficit. He exhibits normal muscle tone. Coordination normal.  Skin: No rash noted.  Psychiatric: He has a normal mood and affect. His behavior is normal.    Results for orders placed or performed in visit on 01/10/15 (from the past 24 hour(s))  POCT CBC     Status: Abnormal   Collection Time: 01/10/15  9:28 AM  Result Value Ref Range   WBC 5.6 4.6 - 10.2 K/uL   Lymph, poc 2.0 0.6 - 3.4   POC LYMPH PERCENT 36.6 10 - 50 %L   MID (cbc) 0.4 0 - 0.9   POC MID % 6.6 0 - 12 %M   POC Granulocyte 3.2 2 - 6.9   Granulocyte percent 56.8 37 - 80 %G   RBC 4.40 (A) 4.69 - 6.13 M/uL   Hemoglobin 15.6 14.1 - 18.1 g/dL   HCT, POC 24.4 01.0 - 53.7 %   MCV 100.5 (A) 80 - 97 fL   MCH, POC 35.4 (A) 27 - 31.2 pg   MCHC 35.2 31.8 -  35.4 g/dL   RDW, POC 13.013.0 %   Platelet Count, POC 111 (A) 142 - 424 K/uL   MPV 7.6 0 - 99.8 fL    ASSESSMENT & PLAN  Gregory Singh was seen today for follow-up.  Diagnoses and all orders for this visit:  Thrombocytopenia ([facility]CC) -     POCT CBC  Hepatitis -     Hepatitis B surface antigen -     Hepatic Function Panel  Elevated triglycerides with high cholesterol -     Triglycerides  Tremor of both hands  High triglycerides  Thrombocytopenia is stable/rec hematology consult

## 2015-01-11 LAB — HEPATITIS B SURF AG CONFIRMATION: HEPATITIS B SURFACE ANTIGEN CONFIRMATION: POSITIVE — AB

## 2015-01-11 LAB — HEPATITIS B SURFACE ANTIGEN: Hepatitis B Surface Ag: POSITIVE — AB

## 2015-01-19 ENCOUNTER — Ambulatory Visit (INDEPENDENT_AMBULATORY_CARE_PROVIDER_SITE_OTHER): Payer: No Typology Code available for payment source | Admitting: Family Medicine

## 2015-01-19 VITALS — BP 122/80 | HR 76 | Temp 97.7°F | Resp 18 | Ht 65.0 in | Wt 129.0 lb

## 2015-01-19 DIAGNOSIS — F17200 Nicotine dependence, unspecified, uncomplicated: Secondary | ICD-10-CM

## 2015-01-19 DIAGNOSIS — F1010 Alcohol abuse, uncomplicated: Secondary | ICD-10-CM

## 2015-01-19 DIAGNOSIS — R7401 Elevation of levels of liver transaminase levels: Secondary | ICD-10-CM

## 2015-01-19 DIAGNOSIS — B169 Acute hepatitis B without delta-agent and without hepatic coma: Secondary | ICD-10-CM

## 2015-01-19 DIAGNOSIS — Z87898 Personal history of other specified conditions: Secondary | ICD-10-CM

## 2015-01-19 DIAGNOSIS — R740 Nonspecific elevation of levels of transaminase and lactic acid dehydrogenase [LDH]: Secondary | ICD-10-CM

## 2015-01-19 DIAGNOSIS — E781 Pure hyperglyceridemia: Secondary | ICD-10-CM

## 2015-01-19 DIAGNOSIS — B1910 Unspecified viral hepatitis B without hepatic coma: Secondary | ICD-10-CM | POA: Insufficient documentation

## 2015-01-19 LAB — HEPATITIS B CORE ANTIBODY, TOTAL: Hep B Core Total Ab: REACTIVE — AB

## 2015-01-19 LAB — HEPATITIS B SURFACE ANTIBODY, QUANTITATIVE: HEPATITIS B-POST: 0 m[IU]/mL

## 2015-01-19 LAB — HEPATITIS B CORE ANTIBODY, IGM: Hep B C IgM: NONREACTIVE

## 2015-01-19 LAB — HEPATITIS A ANTIBODY, TOTAL: HEP A TOTAL AB: REACTIVE — AB

## 2015-01-19 NOTE — Progress Notes (Deleted)
   Subjective:    Patient ID: Gregory HawkMike Singh, male    DOB: 06/11/1960, 54 y.o.   MRN: [id number]  HPI    Review of Systems     Objective:   Physical Exam        Assessment & Plan:

## 2015-01-19 NOTE — Progress Notes (Signed)
Patient ID: Gregory Singh, male   DOB: 10/28/1960, 54 y.o.   MRN: [id number]   01/19/2015 at 9:25 AM  Gregory Singh / DOB: 05/28/1960 / MRN: [id number]  Problem list reviewed and updated by me where necessary.   SUBJECTIVE  Gregory Singh is a 54 y.o. well appearing male presenting for the chief complaint of positive hepatitis B surface antigen and elevated LFTs..     He  has a past medical history of Hypertension.    Medications reviewed and updated by myself where necessary, and exist elsewhere in the encounter.   Gregory Singh is allergic to ibuprofen. He  reports that he has been smoking Cigarettes.  He has been smoking about 0.50 packs per day. He has never used smokeless tobacco. He reports that he drinks about 14.4 oz of alcohol per week. He reports that he does not use illicit drugs. He  has no sexual activity history on file. The patient  has no past surgical history on file.  His family history includes Diabetes in his father; Stroke in his father.  Review of Systems  Constitutional: Negative for fever.  Respiratory: Negative for shortness of breath.   Cardiovascular: Negative for chest pain.  Gastrointestinal: Negative for nausea.  Skin: Negative for rash.  Neurological: Negative for dizziness and headaches.    OBJECTIVE  His  height is 5\' 5"  (1.651 m) and weight is 129 lb (58.514 kg). His oral temperature is 97.7 F (36.5 C). His blood pressure is 122/80 and his pulse is 76. His respiration is 18 and oxygen saturation is 97%.  The patient's body mass index is 21.47 kg/(m^2).  Physical Exam  Constitutional: He is oriented to person, place, and time. He appears well-developed and well-nourished. No distress.  HENT:  Head: Normocephalic.  Nose: Nose normal.  Eyes: Conjunctivae and EOM are normal.  Respiratory: Effort normal.  Neurological: He is alert and oriented to person, place, and time. He exhibits normal muscle tone. Coordination normal.  Psychiatric: He has a normal mood and affect.     No results found for this or any previous visit (from the past 24 hour(s)).  ASSESSMENT & PLAN  There are no diagnoses linked to this encounter.

## 2015-01-19 NOTE — Progress Notes (Signed)
Subjective:    Patient ID: Gregory Singh, male    DOB: 04/07/1960, 54 y.o.   MRN: [id number] This chart was scribed for Norberto SorensonEva Rashmi Tallent, MD by Littie Deedsichard Sun, Medical Scribe. This patient was seen in Room 3 and the patient's care was started at 10:17 AM.   Chief Complaint  Patient presents with  . Follow-up    was told to come in has know idea     HPI HPI Comments: Gregory Singh is a 54 y.o. male who presents to the [facility] for a follow-up for positive hepatitis B surface antigen and elevated LFTs. Patient saw Dr. Perrin MalteseGuest last week and 2 weeks ago; prior to that, he had not seen a doctor for 3 years. He is fasting today. Patient has recently made changes to his smoking, alcohol, and dietary habits. He had been smoking about 0.5 ppd but has cut back by 1/2 so far. He had been drinking about 4-5 drinks a day, but is quitting alcohol. He has also stopped eating fried foods and is eating more fish and vegetables now. Patient has been taking vitamin B and fish oil for a long time. He has never had hepatitis B immunizations. His wife has never been tested for hepatitis B.  Patient is going to back to [place] tomorrow, where the rest of his family is; he will be returning on December 12th. He was last there 5 years ago.  Depression screen [facility] 01/19/2015 01/03/2015  Decreased Interest 0 0  Down, Depressed, Hopeless 0 0  PHQ - 2 Score 0 0    Past Medical History  Diagnosis Date  . Hypertension    Current Outpatient Prescriptions on File Prior to Visit  Medication Sig Dispense Refill  . clobetasol ointment (TEMOVATE) 0.05 % Apply bid to rash on leg liberally, do not use on face or genitals 60 g 1  . metoprolol succinate (TOPROL-XL) 50 MG 24 hr tablet Take 1 tablet (50 mg total) by mouth daily. Take with or immediately following a meal. 90 tablet 3   No current facility-administered medications on file prior to visit.   Allergies  Allergen Reactions  . Ibuprofen Itching     Review of  Systems  Constitutional: Negative for fever, chills, diaphoresis, activity change, appetite change and fatigue.  Gastrointestinal: Negative for nausea, vomiting, abdominal pain, diarrhea, constipation, blood in stool, abdominal distention, anal bleeding and rectal pain.  Hematological: Negative for adenopathy. Does not bruise/bleed easily.  Psychiatric/Behavioral: Negative for sleep disturbance and dysphoric mood.       Objective:  BP 122/80 mmHg  Pulse 76  Temp(Src) 97.7 F (36.5 C) (Oral)  Resp 18  Ht 5\' 5"  (1.651 m)  Wt 129 lb (58.514 kg)  BMI 21.47 kg/m2  SpO2 97%  Physical Exam  Constitutional: He is oriented to person, place, and time. He appears well-developed and well-nourished. No distress.  HENT:  Head: Normocephalic and atraumatic.  Mouth/Throat: Oropharynx is clear and moist. No oropharyngeal exudate.  Eyes: Pupils are equal, round, and reactive to light.  Neck: Neck supple.  Cardiovascular: Normal rate, regular rhythm and normal heart sounds.   No murmur heard. Pulmonary/Chest: Effort normal. He has wheezes.  Left lower lobe inspiratory wheezing, cleared with repeated expirations.  Musculoskeletal: He exhibits no edema.  Neurological: He is alert and oriented to person, place, and time. No cranial nerve deficit.  Skin: Skin is warm and dry. No rash noted.  Psychiatric: He has a normal mood and affect. His  behavior is normal.  Nursing note and vitals reviewed.        Assessment & Plan:   1. Hepatitis B virus infection, unspecified chronicity   2. Transaminitis   3. Hypertriglyceridemia   4. Tobacco use disorder   5. History of heavy alcohol consumption    He had a negative hep C antibody. He has a positive hep B surface antigen with confirmation.  Pt is going to [place] to visit family tomorrow and will return in 1 mo - will get liver US and  f/u appt with me to review labs at that time to determine need for referral to hepatology clinic.  Pt understands and  agrees.   Reviewed importance of smoking and tob cessation.  Orders Placed This Encounter  Procedures  . US Abdomen Complete    Standing Status: Future     Number of Occurrences:      Standing Expiration Date: 03/20/2016    Scheduling Instructions:     Pt is going to [place] on vacation 01/20/15 - he will be back 12/12 so can schedule anytime after that.    Order Specific Question:  Reason for Exam (SYMPTOM  OR DIAGNOSIS REQUIRED)    Answer:  transaminitis, +elev trig so suspect fatty liver but also + chronic hepatitis B    Order Specific Question:  Preferred imaging location?    Answer:  [facility]  . Hepatitis A Antibody, Total  . Hepatitis B core antibody, IgM  . Hepatitis B core antibody, total  . Hepatitis B DNA, Ultraquantitative, PCR  . Hepatitis B E Antigen  . Hepatitis B surface antibody     I personally performed the services described in this documentation, which was scribed in my presence. The recorded information has been reviewed and considered, and addended by me as needed.  Norberto Sorenson, MD MPH  By signing my name below, I, Littie Deeds, attest that this documentation has been prepared under the direction and in the presence of Norberto Sorenson, MD.  Electronically Signed: Littie Deeds, Medical Scribe. 01/19/2015. 10:17 AM.

## 2015-01-19 NOTE — Patient Instructions (Addendum)
Appointment made December 15 for 10:15 to see Dr. Clelia Croft at the [facility]. Please remember to be fasting at that time.  Vim gan B (Hepatitis B) Vim gan B l m?t b?nh nhi?m vi rt ? gan. C hai lo?i vim gan B:  Vim gan B c?p tnh. Vim gan B ko di su thng ho?c t h?n g?i l vim gan B c?p tnh.  Vim gan B m?n tnh. Vim gan B ko di h?n su thng ???c g?i l vim gan B ko di (m?n tnh). Vim gan B c th? d?n ??n suy gan, hnh thnh s?o ? gan(x? gan), ho?c ung th? gan. Vim gan B c?p tnh c th? tr? thnh vim gan B m?n tnh. H?u h?t ng??i l?n b? vim gan B c?p tnh khng ti?n tri?n thnh vim gan B m?n tnh. Tr? s? sinh v tr? nh? b? vim gan B c kh? n?ng ti?n tri?n thnh vim gan B m?n tnh h?n ng??i l?n b? vim gan B. NGUYN NHN Vim gan B do vi rt vim gan B (HBV) gy ra. Vi rt ny ???c truy?n t? ng??i ny sang ng??i khc qua ???ng mu, sinh ??, quan h? tnh d?c, ho?c d?ch ti?t c?a c? th?, ch?ng h?n:  S?a m?.  N??c m?t.  N??c b?t. CC Y?U T? NGUY C? Nh?ng y?u t? nguy c? b? vim gan B bao g?m:  C quan h? tnh d?ng khng c bi?n php b?o v? v?i ng??i b? nhi?m b?nh.  Tim chch ma ty. D?U HI?U V TRI?U CH?NG Tri?u ch?ng c?a vim gan B c th? bao g?m:  ?n khng ngon mi?ng.  M?t m?i.  Bu?n nn.  Nn.  ?au d? dy.  N??c ti?u vng ??m.  Vng da v m?t (b?nh vng da). Vim gan B khng ph?i lc no c?ng gy ra tri?u ch?ng. CH?N ?ON Chuyn gia ch?m [place] s?c kh?e s? lm xt nghi?m mu ?? ch?n ?on vim gan B. ?I?U TR? Qu v? s? c?n ph?i ng?n ng?a vi?c gy th??ng t?n ti?p theo cho gan b?ng cch trnh u?ng r??u v trnh dng cc lo?i thu?c kh chuy?n ha ? gan. ?i?u tr? vim gan B m?n tnh c th? bao g?m thu?c khng vi rt. Thu?c ny c th? gip:  Gi?m nguy c? suy gan.  Gi?m kh? n?ng qu v? ly nhi?m vim gan B cho ng??i khc.  Gi?m nguy c? hnh thnh s?o ? gan (x? gan).  Gi?m nguy c? ung th? gan. H??NG D?N CH?M [place] T?I NH   Ngh? ng?i khi  c?n.  Trnh u?ng r??u.  Ch? s? d?ng thu?c theo ch? d?n c?a chuyn gia ch?m [place] s?c kh?e.  Khng dng b?t k? lo?i thu?c no m khng ???c chuyn gia ch?m [place] s?c kh?e c?a qu v? ch?p thu?n. Thu?c bao g?m c? cc lo?i thu?c khng c?n k ??n th??ng ???c dng ?? h? s?t ho?c gi?m ?au.  Khng quan h? tnh d?c tr? khi ???c chuyn gia ch?m [place] s?c kh?e ch?p thu?n.  Khng dng chung bn ch?i ?nh r?ng, b?m mng tay, dao c?o ru, ho?c kim tim v?i ng??i khc. NGAY L?P T?C ?I KHM N?U:   Qu v? khng th? ?n ho?c khng th? u?ng ???c.  Qu v? b? s?t km theo bu?n nn ho?c nn m?a.  Qu v? c?m th?y l l?n.  Qu v? b? vng da ho?c b?nh vng da m?n tnh c?a qu v? tr?m tr?ng h?n.  Qu v? b? kh  th?.  Qu v? b? pht ban.  Da, h?ng, mi?ng, ho?c m?t qu v? b? s?ng.  C? th? qu v? run r?y ho?c co gi?t (??ng kinh).  Qu v? r?t bu?n ng? v kh t?nh d?y.   Thng tin ny khng nh?m m?c ?ch thay th? cho l?i khuyn m chuyn gia ch?m [place] s?c kh?e ni v?i qu v?. Hy b?o ??m qu v? ph?i th?o lu?n b?t k? v?n ?? g m qu v? c v?i chuyn gia ch?m [place] s?c kh?e c?a qu v?.   Document Released: 03/05/2005 Document Revised: 11/24/2014 Elsevier Interactive Patient Education 2016 [employer] Food Choices to Lower Your Triglycerides Triglycerides are a type of fat in your blood. High levels of triglycerides can increase the risk of heart disease and stroke. If your triglyceride levels are high, the foods you eat and your eating habits are very important. Choosing the right foods can help lower your triglycerides.  WHAT GENERAL GUIDELINES DO I NEED TO FOLLOW?  Lose weight if you are overweight.   Limit or avoid alcohol.   Fill one half of your plate with vegetables and green salads.   Limit fruit to two servings a day. Choose fruit instead of juice.   Make one fourth of your plate whole grains. Look for the word "whole" as the first word in the ingredient list.  Fill one fourth of your plate with  lean protein foods.  Enjoy fatty fish (such as salmon, mackerel, sardines, and tuna) three times a week.   Choose healthy fats.   Limit foods high in starch and sugar.  Eat more home-cooked food and less restaurant, buffet, and fast food.  Limit fried foods.  Cook foods using methods other than frying.  Limit saturated fats.  Check ingredient lists to avoid foods with partially hydrogenated oils (trans fats) in them. WHAT FOODS CAN I EAT?  Grains Whole grains, such as whole wheat or whole grain breads, crackers, cereals, and pasta. Unsweetened oatmeal, bulgur, barley, quinoa, or brown rice. Corn or whole wheat flour tortillas.  Vegetables Fresh or frozen vegetables (raw, steamed, roasted, or grilled). Green salads. Fruits All fresh, canned (in natural juice), or frozen fruits. Meat and Other Protein Products Ground beef (85% or leaner), grass-fed beef, or beef trimmed of fat. Skinless chicken or [place]. Ground chicken or [place]. Pork trimmed of fat. All fish and seafood. Eggs. Dried beans, peas, or lentils. Unsalted nuts or seeds. Unsalted canned or dry beans. Dairy Low-fat dairy products, such as skim or 1% milk, 2% or reduced-fat cheeses, low-fat ricotta or cottage cheese, or plain low-fat yogurt. Fats and Oils Tub margarines without trans fats. Light or reduced-fat mayonnaise and salad dressings. Avocado. Safflower, olive, or canola oils. Natural peanut or almond butter. The items listed above may not be a complete list of recommended foods or beverages. Contact your dietitian for more options. WHAT FOODS ARE NOT RECOMMENDED?  Grains White bread. White pasta. White rice. Cornbread. Bagels, pastries, and croissants. Crackers that contain trans fat. Vegetables White potatoes. Corn. Creamed or fried vegetables. Vegetables in a cheese sauce. Fruits Dried fruits. Canned fruit in light or heavy syrup. Fruit juice. Meat and Other Protein Products Fatty cuts of meat. Ribs, chicken  wings, bacon, sausage, bologna, salami, chitterlings, fatback, hot dogs, bratwurst, and packaged luncheon meats. Dairy Whole or 2% milk, cream, half-and-half, and cream cheese. Whole-fat or sweetened yogurt. Full-fat cheeses. Nondairy creamers and whipped toppings. Processed cheese, cheese spreads, or cheese curds. Sweets and Desserts Corn syrup, sugars,  honey, and molasses. Candy. Jam and jelly. Syrup. Sweetened cereals. Cookies, pies, cakes, donuts, muffins, and ice cream. Fats and Oils Butter, stick margarine, lard, shortening, ghee, or bacon fat. Coconut, palm kernel, or palm oils. Beverages Alcohol. Sweetened drinks (such as sodas, lemonade, and fruit drinks or punches). The items listed above may not be a complete list of foods and beverages to avoid. Contact your dietitian for more information.   This information is not intended to replace advice given to you by your health care provider. Make sure you discuss any questions you have with your health care provider.   Document Released: 12/22/2003 Document Revised: 03/26/2014 Document Reviewed: 01/07/2013 Elsevier Interactive Patient Education 2016 Elsevier Inc.  Mediterranean Diet  Why follow it? Research shows. Those who follow the Mediterranean diet have a reduced risk of heart disease  The diet is associated with a reduced incidence of Parkinson's and Alzheimer's diseases People following the diet may have longer life expectancies and lower rates of chronic diseases  The Dietary Guidelines for Americans recommends the Mediterranean diet as an eating plan to promote health and prevent disease  What Is the Mediterranean Diet?  Healthy eating plan based on typical foods and recipes of Mediterranean-style cooking The diet is primarily a plant based diet; these foods should make up a majority of meals   Starches - Plant based foods should make up a majority of meals - They are an important sources of vitamins, minerals, energy,  antioxidants, and fiber - Choose whole grains, foods high in fiber and minimally processed items  - Typical grain sources include wheat, oats, barley, corn, brown rice, bulgar, farro, millet, polenta, couscous  - Various types of beans include chickpeas, lentils, fava beans, black beans, white beans  Fruits Veggies - Large quantities of antioxidant rich fruits & veggies; 6 or more servings  - Vegetables can be eaten raw or lightly drizzled with oil and cooked  - Vegetables common to the traditional Mediterranean Diet include: artichokes, arugula, beets, broccoli, brussel sprouts, cabbage, carrots, celery, collard greens, cucumbers, eggplant, kale, leeks, lemons, lettuce, mushrooms, okra, onions, peas, peppers, potatoes, pumpkin, radishes, rutabaga, shallots, spinach, sweet potatoes, turnips, zucchini - Fruits common to the Mediterranean Diet include: apples, apricots, avocados, cherries, clementines, dates, figs, grapefruits, grapes, melons, nectarines, oranges, peaches, pears, pomegranates, strawberries, tangerines Fats - Replace butter and margarine with healthy oils, such as olive oil, canola oil, and tahini  - Limit nuts to no more than a handful a day  - Nuts include walnuts, almonds, pecans, pistachios, pine nuts  - Limit or avoid candied, honey roasted or heavily salted nuts - Olives are central to the [employer] - can be eaten whole or used in a variety of dishes  Meats Protein - Limiting red meat: no more than a few times a month - When eating red meat: choose lean cuts and keep the portion to the size of deck of cards - Eggs: approx. 0 to 4 times a week  - Fish and lean poultry: at least 2 a week  - Healthy protein sources include, chicken, [place], lean beef, lamb - Increase intake of seafood such as tuna, salmon, trout, mackerel, shrimp, scallops - Avoid or limit high fat processed meats such as sausage and bacon Dairy - Include moderate amounts of low fat dairy products  -  Focus on healthy dairy such as fat free yogurt, skim milk, low or reduced fat cheese - Limit dairy products higher in fat such as whole or 2% milk,  cheese, ice cream  Alcohol - Moderate amounts of red wine is ok  - No more than 5 oz daily for women (all ages) and men older than age 35  - No more than 10 oz of wine daily for men younger than 34 Other - Limit sweets and other desserts  - Use herbs and spices instead of salt to flavor foods  - Herbs and spices common to the traditional Mediterranean Diet include: basil, bay leaves, chives, cloves, cumin, fennel, garlic, lavender, marjoram, mint, oregano, parsley, pepper, rosemary, sage, savory, sumac, tarragon, thyme  It's not just a diet, it's a lifestyle:  The Mediterranean diet includes lifestyle factors typical of those in the region  Foods, drinks and meals are best eaten with others and savored Daily physical activity is important for overall good health This could be strenuous exercise like running and aerobics This could also be more leisurely activities such as walking, housework, yard-work, or taking the stairs Moderation is the key; a balanced and healthy diet accommodates most foods and drinks Consider portion sizes and frequency of consumption of certain foods   Meal Ideas & Options:  Breakfast:  Whole wheat toast or whole wheat English muffins with peanut butter & hard boiled egg Steel cut oats topped with apples & cinnamon and skim milk  Fresh fruit: banana, strawberries, melon, berries, peaches  Smoothies: strawberries, bananas, greek yogurt, peanut butter Low fat greek yogurt with blueberries and granola  Egg white omelet with spinach and mushrooms Breakfast couscous: whole wheat couscous, apricots, skim milk, cranberries  Sandwiches:  Hummus and grilled vegetables (peppers, zucchini, squash) on whole wheat bread  Grilled chicken on whole wheat pita with lettuce, tomatoes, cucumbers or tzatziki  [place] salad on whole wheat  bread: tuna salad made with greek yogurt, olives, red peppers, capers, green onions Garlic rosemary lamb pita: lamb sauted with garlic, rosemary, salt & pepper; add lettuce, cucumber, greek yogurt to pita - flavor with lemon juice and black pepper  Seafood:  Mediterranean grilled salmon, seasoned with garlic, basil, parsley, lemon juice and black pepper Shrimp, lemon, and spinach whole-grain pasta salad made with low fat greek yogurt  Seared scallops with lemon orzo  Seared tuna steaks seasoned salt, pepper, coriander topped with tomato mixture of olives, tomatoes, olive oil, minced garlic, parsley, green onions and cappers  Meats:  Herbed greek chicken salad with kalamata olives, cucumber, feta  Red bell peppers stuffed with spinach, bulgur, lean ground beef (or lentils) & topped with feta  Kebabs: skewers of chicken, tomatoes, onions, zucchini, squash  [place] burgers: made with red onions, mint, dill, lemon juice, feta cheese topped with roasted red peppers Vegetarian Cucumber salad: cucumbers, artichoke hearts, celery, red onion, feta cheese, tossed in olive oil & lemon juice  Hummus and whole grain pita points with a greek salad (lettuce, tomato, feta, olives, cucumbers, red onion) Lentil soup with celery, carrots made with vegetable broth, garlic, salt and pepper  Tabouli salad: parsley, bulgur, mint, scallions, cucumbers, tomato, radishes, lemon juice, olive oil, salt and pepper.

## 2015-01-20 ENCOUNTER — Ambulatory Visit: Payer: No Typology Code available for payment source | Admitting: Family Medicine

## 2015-01-21 LAB — HEPATITIS B E ANTIGEN: HEPATITIS BE ANTIGEN: NONREACTIVE

## 2015-01-22 ENCOUNTER — Encounter: Payer: Self-pay | Admitting: Family Medicine

## 2015-01-25 ENCOUNTER — Telehealth: Payer: Self-pay | Admitting: Oncology

## 2015-01-25 LAB — HEPATITIS B DNA, ULTRAQUANTITATIVE, PCR
HEPATITIS B DNA (CALC): 4889 {Log_IU}/mL — AB (ref <116)
Hepatitis B DNA: 840 IU/mL — ABNORMAL HIGH (ref <20)

## 2015-01-25 NOTE — Telephone Encounter (Signed)
Lt mess regarding new pt appt.  °

## 2015-01-31 ENCOUNTER — Telehealth: Payer: Self-pay | Admitting: Oncology

## 2015-01-31 NOTE — Telephone Encounter (Signed)
Lt mess regarding new pt referral.  °

## 2015-03-03 ENCOUNTER — Ambulatory Visit (INDEPENDENT_AMBULATORY_CARE_PROVIDER_SITE_OTHER): Payer: No Typology Code available for payment source | Admitting: Family Medicine

## 2015-03-03 VITALS — BP 152/82 | HR 80 | Temp 98.7°F | Resp 16 | Ht 65.0 in | Wt 134.0 lb

## 2015-03-03 DIAGNOSIS — Z720 Tobacco use: Secondary | ICD-10-CM

## 2015-03-03 DIAGNOSIS — Z789 Other specified health status: Secondary | ICD-10-CM | POA: Diagnosis not present

## 2015-03-03 DIAGNOSIS — I10 Essential (primary) hypertension: Secondary | ICD-10-CM

## 2015-03-03 DIAGNOSIS — D696 Thrombocytopenia, unspecified: Secondary | ICD-10-CM | POA: Diagnosis not present

## 2015-03-03 DIAGNOSIS — B181 Chronic viral hepatitis B without delta-agent: Secondary | ICD-10-CM | POA: Diagnosis not present

## 2015-03-03 DIAGNOSIS — R7989 Other specified abnormal findings of blood chemistry: Secondary | ICD-10-CM | POA: Diagnosis not present

## 2015-03-03 DIAGNOSIS — E781 Pure hyperglyceridemia: Secondary | ICD-10-CM

## 2015-03-03 DIAGNOSIS — Z7289 Other problems related to lifestyle: Secondary | ICD-10-CM

## 2015-03-03 DIAGNOSIS — R945 Abnormal results of liver function studies: Secondary | ICD-10-CM

## 2015-03-03 NOTE — Progress Notes (Signed)
Subjective:    Patient ID: Gregory Singh, male    DOB: 1961/02/14, 54 y.o.   MRN: [id number]   Chief Complaint  Patient presents with  . Follow-up  . Hyperlipidemia    HPI  Gregory Singh is here to f/u on his new diagnosis of chronic Hep B. He is accompanied by his wife today,  H/O + Hep B Surf Ag with confirmation and elevated LFTs: No h/o hep B immunizations, wife never tested. Pt just returned sev days ago from a 6 wk trip to [place] to visit family. Neg Hep C. Is here today as requested for repeat labs, will get liver US and likely need referral to hepatology clinic. Serology is consistent with chronic hepatitis B infection and has + Hep A ab. Pt does have + DNA for Hep B so not latent. Serology:  neg Hep B Core IgM Pos Hep B Core Tot Ab Neg Hep B E Ag Neg Hep B Surf Ab + Hep B viral load  Tobacco abuse: decreased from 1/2 ppd to 1/4 ppd  EtoH abuse:  Was using 4-5 drinks/d and did drink due to celebrations with recent family reunion in [place] but is planning to stop drinking now that he is back home - he doesn't think this will be difficult for him today.  HPL: had stop fried foods, more fish and veggies. Using fish oil for a longtime.  HTN: prev well controlled on Toprol 50  Past Medical History  Diagnosis Date  . Hypertension    No past surgical history on file. Current Outpatient Prescriptions on File Prior to Visit  Medication Sig Dispense Refill  . clobetasol ointment (TEMOVATE) 0.05 % Apply bid to rash on leg liberally, do not use on face or genitals 60 g 1  . metoprolol succinate (TOPROL-XL) 50 MG 24 hr tablet Take 1 tablet (50 mg total) by mouth daily. Take with or immediately following a meal. 90 tablet 3   No current facility-administered medications on file prior to visit.   Allergies  Allergen Reactions  . Ibuprofen Itching   Family History  Problem Relation Age of Onset  . Diabetes Father   . Stroke Father    Social History   Social History  . Marital  Status: Married    Spouse Name: N/A  . Number of Children: N/A  . Years of Education: N/A   Social History Main Topics  . Smoking status: Current Every Day Smoker -- 0.50 packs/day    Types: Cigarettes  . Smokeless tobacco: Never Used  . Alcohol Use: 14.4 oz/week    24 Standard drinks or equivalent per week  . Drug Use: No  . Sexual Activity: Not on file   Other Topics Concern  . Not on file   Social History Narrative     Review of Systems  Constitutional: Positive for appetite change. Negative for fever, chills, diaphoresis and activity change.  Respiratory: Negative for chest tightness and shortness of breath.   Cardiovascular: Negative for chest pain.  Gastrointestinal: Negative for nausea, vomiting, abdominal pain, diarrhea, constipation, blood in stool, abdominal distention, anal bleeding and rectal pain.  Genitourinary: Negative for dysuria, frequency and decreased urine volume.  Skin: Negative for color change.  Hematological: Negative for adenopathy. Does not bruise/bleed easily.       Objective:  BP 152/82 mmHg  Pulse 80  Temp(Src) 98.7 F (37.1 C)  Resp 16  Ht  (1.651 m)  Wt 134 lb (60.782 kg)  BMI 22.30 kg/m2  Physical Exam  Constitutional: He is oriented to person, place, and time. He appears well-developed and well-nourished. No distress.  HENT:  Head: Normocephalic and atraumatic.  Eyes: Conjunctivae are normal. Pupils are equal, round, and reactive to light. No scleral icterus.  Neck: Normal range of motion. Neck supple. No thyromegaly present.  Cardiovascular: Normal rate, regular rhythm, normal heart sounds and intact distal pulses.   Pulmonary/Chest: Effort normal and breath sounds normal. No respiratory distress.  Abdominal: Soft. Normal appearance and bowel sounds are normal. He exhibits no distension, no fluid wave, no ascites and no mass. There is no hepatosplenomegaly. There is no tenderness.  Musculoskeletal: He exhibits no edema.    Lymphadenopathy:    He has no cervical adenopathy.  Neurological: He is alert and oriented to person, place, and time.  Skin: Skin is warm and dry. He is not diaphoretic.  Psychiatric: He has a normal mood and affect. His behavior is normal.          Assessment & Plan:   1. Chronic hepatitis B virus infection (HCC) - refer to ID to see if he is a candidate for treatment, needs to sched abd US prev ordered  2. Thrombocytopenia (HCC)   3. High triglycerides - pt made many diet changes so needs recheck.  4. Essential hypertension, benign   5. Tobacco abuse - encouraged cessation  6. Elevated liver function tests   7. Alcohol use (HCC) - encouraged cessation - pt agrees and doesn't anticipate any problems with his ability to comply.  Pt needs fasting lipids but is not fasting today so agrees to RTC within the next wk for lab only visit for below.  Orders Placed This Encounter  Procedures  . Comprehensive metabolic panel    Standing Status: Future     Number of Occurrences:      Standing Expiration Date: 03/02/2016  . Lipid panel    Standing Status: Future     Number of Occurrences:      Standing Expiration Date: 03/02/2016  . CBC    Standing Status: Future     Number of Occurrences:      Standing Expiration Date: 03/02/2016  . Sedimentation rate    Standing Status: Future     Number of Occurrences:      Standing Expiration Date: 03/02/2016   Language level caveat - pt is [place] and speaks moderate amount of English   Norberto SorensonEva Shaw, MD MPH  Results for orders placed or performed in visit on 01/19/15  Hepatitis A Antibody, Total  Result Value Ref Range   Hep A Total Ab REACTIVE (A) NON REACTIVE  Hepatitis B core antibody, IgM  Result Value Ref Range   Hep B C IgM NON REACTIVE NON REACTIVE  Hepatitis B core antibody, total  Result Value Ref Range   Hep B Core Total Ab REACTIVE (A) NON REACTIVE  Hepatitis B DNA, Ultraquantitative, PCR  Result Value Ref Range    Hepatitis B DNA 840 (H) <20 IU/mL   Hepatitis B DNA (Calc) 4889 (H) <116 Log IU/mL  Hepatitis B E Antigen  Result Value Ref Range   Hepatitis Be Antigen NON-REACTIVE NON-REACTIVE  Hepatitis B surface antibody  Result Value Ref Range   Hepatitis B-Post 0.0 mIU/mL

## 2015-03-05 NOTE — Addendum Note (Signed)
Addended by: Norberto SorensonSHAW, Lovie Zarling on: 03/05/2015 12:08 PM   Modules accepted: [employer]

## 2015-03-05 NOTE — Addendum Note (Signed)
Addended by: Norberto SorensonSHAW, Yuriel Lopezmartinez on: 03/05/2015 12:29 PM   Modules accepted: [employer]Set

## 2015-03-05 NOTE — Progress Notes (Signed)
Subjective:    Patient ID: Gregory Singh, male    DOB: 1960/11/27, 54 y.o.   MRN: [id number]   Chief Complaint  Patient presents with  . Follow-up  . Hyperlipidemia    Hyperlipidemia Pertinent negatives include no chest pain or shortness of breath.    Gregory Singh is here to f/u on his new diagnosis of chronic Hep B. He is accompanied by his wife today,  H/O + Hep B Surf Ag with confirmation and elevated LFTs: No h/o hep B immunizations, wife never tested. Pt just returned sev days ago from a 6 wk trip to [place] to visit family. Neg Hep C. Is here today as requested for repeat labs, will get liver US and likely need referral to hepatology clinic. Serology is consistent with chronic hepatitis B infection and has + Hep A ab. Pt does have + DNA for Hep B so not latent. Serology:  neg Hep B Core IgM Pos Hep B Core Tot Ab Neg Hep B E Ag Neg Hep B Surf Ab + Hep B viral load  Tobacco abuse: decreased from 1/2 ppd to 1/4 ppd  EtoH abuse:  Was using 4-5 drinks/d and did drink due to celebrations with recent family reunion in [place] but is planning to stop drinking now that he is back home - he doesn't think this will be difficult for him today.  HPL: had stop fried foods, more fish and veggies. Using fish oil for a longtime.  HTN: prev well controlled on Toprol 50  Past Medical History  Diagnosis Date  . Hypertension    No past surgical history on file. Current Outpatient Prescriptions on File Prior to Visit  Medication Sig Dispense Refill  . clobetasol ointment (TEMOVATE) 0.05 % Apply bid to rash on leg liberally, do not use on face or genitals 60 g 1  . metoprolol succinate (TOPROL-XL) 50 MG 24 hr tablet Take 1 tablet (50 mg total) by mouth daily. Take with or immediately following a meal. 90 tablet 3   No current facility-administered medications on file prior to visit.   Allergies  Allergen Reactions  . Ibuprofen Itching   Family History  Problem Relation Age of Onset  . Diabetes  Father   . Stroke Father    Social History   Social History  . Marital Status: Married    Spouse Name: N/A  . Number of Children: N/A  . Years of Education: N/A   Social History Main Topics  . Smoking status: Current Every Day Smoker -- 0.50 packs/day    Types: Cigarettes  . Smokeless tobacco: Never Used  . Alcohol Use: 14.4 oz/week    24 Standard drinks or equivalent per week  . Drug Use: No  . Sexual Activity: Not on file   Other Topics Concern  . Not on file   Social History Narrative     Review of Systems  Constitutional: Positive for appetite change. Negative for fever, chills, diaphoresis and activity change.  Respiratory: Negative for chest tightness and shortness of breath.   Cardiovascular: Negative for chest pain.  Gastrointestinal: Negative for nausea, vomiting, abdominal pain, diarrhea, constipation, blood in stool, abdominal distention, anal bleeding and rectal pain.  Genitourinary: Negative for dysuria, frequency and decreased urine volume.  Skin: Negative for color change.  Hematological: Negative for adenopathy. Does not bruise/bleed easily.       Objective:  BP 152/82 mmHg  Pulse 80  Temp(Src) 98.7 F (37.1 C)  Resp 16  Ht  (  1.651 m)  Wt 134 lb (60.782 kg)  BMI 22.30 kg/m2  Physical Exam  Constitutional: He is oriented to person, place, and time. He appears well-developed and well-nourished. No distress.  HENT:  Head: Normocephalic and atraumatic.  Eyes: Conjunctivae are normal. Pupils are equal, round, and reactive to light. No scleral icterus.  Neck: Normal range of motion. Neck supple. No thyromegaly present.  Cardiovascular: Normal rate, regular rhythm, normal heart sounds and intact distal pulses.   Pulmonary/Chest: Effort normal and breath sounds normal. No respiratory distress.  Abdominal: Soft. Normal appearance and bowel sounds are normal. He exhibits no distension, no fluid wave, no ascites and no mass. There is no  hepatosplenomegaly. There is no tenderness.  Musculoskeletal: He exhibits no edema.  Lymphadenopathy:    He has no cervical adenopathy.  Neurological: He is alert and oriented to person, place, and time.  Skin: Skin is warm and dry. He is not diaphoretic.  Psychiatric: He has a normal mood and affect. His behavior is normal.          Assessment & Plan:   1. Chronic hepatitis B virus infection (HCC) - refer to ID to see if he is a candidate for treatment, needs to sched abd [place] prev ordered  2. Thrombocytopenia (HCC)   3. High triglycerides - pt made many diet changes so needs recheck.  4. Essential hypertension, benign   5. Tobacco abuse - encouraged cessation  6. Elevated liver function tests   7. Alcohol use (HCC) - encouraged cessation - pt agrees and doesn't anticipate any problems with his ability to comply.  Pt needs fasting lipids but is not fasting today so agrees to RTC within the next wk for lab only visit for below.  Orders Placed This Encounter  Procedures  . Comprehensive metabolic panel    Standing Status: Future     Number of Occurrences:      Standing Expiration Date: 03/02/2016  . Lipid panel    Standing Status: Future     Number of Occurrences:      Standing Expiration Date: 03/02/2016  . CBC    Standing Status: Future     Number of Occurrences:      Standing Expiration Date: 03/02/2016  . Sedimentation rate    Standing Status: Future     Number of Occurrences:      Standing Expiration Date: 03/02/2016   Language level caveat - [place] is [place] and speaks moderate amount of [place].  Pt brought his [facility] lab bill with him today for >$400 which he cannot afford - it appears that his insurance didn't cover any of his prior labs from 10/17 visit with Dr. Perrin Maltese. Pt advised to contact [facility] and/or his insurance co to see why these weren't covered and see if there are any adjustments we can make on our end to facilitate coverage as well.   Norberto Sorenson, MD  [facility]  Results for orders placed or performed in visit on 01/19/15  Hepatitis A Antibody, Total  Result Value Ref Range   Hep A Total Ab REACTIVE (A) NON REACTIVE  Hepatitis B core antibody, IgM  Result Value Ref Range   Hep B C IgM NON REACTIVE NON REACTIVE  Hepatitis B core antibody, total  Result Value Ref Range   Hep B Core Total Ab REACTIVE (A) NON REACTIVE  Hepatitis B DNA, Ultraquantitative, PCR  Result Value Ref Range   Hepatitis B DNA 840 (H) <20 IU/mL   Hepatitis B DNA (Calc) 4889 (  H) <116 Log IU/mL  Hepatitis B E Antigen  Result Value Ref Range   Hepatitis Be Antigen NON-REACTIVE NON-REACTIVE  Hepatitis B surface antibody  Result Value Ref Range   Hepatitis B-Post 0.0 mIU/mL

## 2015-03-07 ENCOUNTER — Other Ambulatory Visit (INDEPENDENT_AMBULATORY_CARE_PROVIDER_SITE_OTHER): Payer: No Typology Code available for payment source

## 2015-03-07 ENCOUNTER — Telehealth: Payer: Self-pay

## 2015-03-07 ENCOUNTER — Encounter: Payer: Self-pay | Admitting: Family Medicine

## 2015-03-07 DIAGNOSIS — R945 Abnormal results of liver function studies: Secondary | ICD-10-CM

## 2015-03-07 DIAGNOSIS — I10 Essential (primary) hypertension: Secondary | ICD-10-CM

## 2015-03-07 DIAGNOSIS — R7989 Other specified abnormal findings of blood chemistry: Secondary | ICD-10-CM

## 2015-03-07 DIAGNOSIS — D696 Thrombocytopenia, unspecified: Secondary | ICD-10-CM

## 2015-03-07 LAB — LIPID PANEL
Cholesterol: 171 mg/dL (ref 125–200)
HDL: 29 mg/dL — ABNORMAL LOW
Total CHOL/HDL Ratio: 5.9 ratio — ABNORMAL HIGH
Triglycerides: 646 mg/dL — ABNORMAL HIGH

## 2015-03-07 LAB — CBC
HCT: 45.9 % (ref 39.0–52.0)
Hemoglobin: 15.6 g/dL (ref 13.0–17.0)
MCH: 34.6 pg — ABNORMAL HIGH (ref 26.0–34.0)
MCHC: 34 g/dL (ref 30.0–36.0)
MCV: 101.8 fL — ABNORMAL HIGH (ref 78.0–100.0)
MPV: 11.4 fL (ref 8.6–12.4)
Platelets: 130 K/uL — ABNORMAL LOW (ref 150–400)
RBC: 4.51 MIL/uL (ref 4.22–5.81)
RDW: 12.9 % (ref 11.5–15.5)
WBC: 6.2 K/uL (ref 4.0–10.5)

## 2015-03-07 LAB — COMPREHENSIVE METABOLIC PANEL WITH GFR
ALT: 79 U/L — ABNORMAL HIGH (ref 9–46)
AST: 81 U/L — ABNORMAL HIGH (ref 10–35)
Albumin: 4.1 g/dL (ref 3.6–5.1)
Alkaline Phosphatase: 86 U/L (ref 40–115)
BUN: 13 mg/dL (ref 7–25)
CO2: 24 mmol/L (ref 20–31)
Calcium: 9 mg/dL (ref 8.6–10.3)
Chloride: 104 mmol/L (ref 98–110)
Creat: 0.85 mg/dL (ref 0.70–1.33)
Glucose, Bld: 89 mg/dL (ref 65–99)
Potassium: 4 mmol/L (ref 3.5–5.3)
Sodium: 137 mmol/L (ref 135–146)
Total Bilirubin: 0.4 mg/dL (ref 0.2–1.2)
Total Protein: 7.5 g/dL (ref 6.1–8.1)

## 2015-03-07 LAB — SEDIMENTATION RATE: Sed Rate: 4 mm/h (ref 0–20)

## 2015-03-07 NOTE — Telephone Encounter (Signed)
[facility] and gave [facility] additional information.  Pt notified.

## 2015-03-07 NOTE — Telephone Encounter (Signed)
-----   Message from Sherren MochaEva N Shaw, MD sent at 03/05/2015 12:29 PM EST ----- Pt brought in a [facility] bill for >$400 on the labs that were ordered by Dr. Perrin MalteseGuest on 10/17 - his insurance did not cover cmp, lipids, b12, psa, tsh, hep C, or HIV test. Please see if there is anything we can change to get him coverage like resubmit after associating with a different diagnosis or something. Thanks, Carley HammedEva

## 2015-03-16 ENCOUNTER — Ambulatory Visit
Admission: RE | Admit: 2015-03-16 | Discharge: 2015-03-16 | Disposition: A | Discharge status: Home / Self Care | Payer: No Typology Code available for payment source | Source: Ambulatory Visit | Attending: Family Medicine | Admitting: Family Medicine

## 2015-03-16 DIAGNOSIS — F1090 Alcohol use, unspecified, uncomplicated: Secondary | ICD-10-CM

## 2015-03-16 DIAGNOSIS — B181 Chronic viral hepatitis B without delta-agent: Secondary | ICD-10-CM

## 2015-03-16 DIAGNOSIS — D696 Thrombocytopenia, unspecified: Secondary | ICD-10-CM

## 2015-03-16 DIAGNOSIS — Z789 Other specified health status: Secondary | ICD-10-CM

## 2015-03-16 DIAGNOSIS — R945 Abnormal results of liver function studies: Secondary | ICD-10-CM

## 2015-03-16 DIAGNOSIS — Z7289 Other problems related to lifestyle: Secondary | ICD-10-CM

## 2015-03-16 DIAGNOSIS — R7989 Other specified abnormal findings of blood chemistry: Secondary | ICD-10-CM

## 2015-03-16 DIAGNOSIS — E781 Pure hyperglyceridemia: Secondary | ICD-10-CM

## 2015-03-22 ENCOUNTER — Encounter: Payer: Self-pay | Admitting: Family Medicine

## 2015-05-12 ENCOUNTER — Ambulatory Visit (INDEPENDENT_AMBULATORY_CARE_PROVIDER_SITE_OTHER): Payer: BLUE CROSS/BLUE SHIELD | Admitting: Internal Medicine

## 2015-05-12 ENCOUNTER — Encounter: Payer: Self-pay | Admitting: Internal Medicine

## 2015-05-12 VITALS — BP 143/77 | HR 78 | Temp 98.7°F | Wt 133.0 lb

## 2015-05-12 DIAGNOSIS — G4700 Insomnia, unspecified: Secondary | ICD-10-CM

## 2015-05-12 DIAGNOSIS — B181 Chronic viral hepatitis B without delta-agent: Secondary | ICD-10-CM

## 2015-05-12 LAB — COMPLETE METABOLIC PANEL WITH GFR
ALBUMIN: 4.3 g/dL (ref 3.6–5.1)
ALK PHOS: 72 U/L (ref 40–115)
ALT: 84 U/L — ABNORMAL HIGH (ref 9–46)
AST: 131 U/L — ABNORMAL HIGH (ref 10–35)
BILIRUBIN TOTAL: 0.9 mg/dL (ref 0.2–1.2)
BUN: 9 mg/dL (ref 7–25)
CO2: 23 mmol/L (ref 20–31)
Calcium: 9.3 mg/dL (ref 8.6–10.3)
Chloride: 107 mmol/L (ref 98–110)
Creat: 0.71 mg/dL (ref 0.70–1.33)
Glucose, Bld: 80 mg/dL (ref 65–99)
Potassium: 3.9 mmol/L (ref 3.5–5.3)
Sodium: 141 mmol/L (ref 135–146)
TOTAL PROTEIN: 7.4 g/dL (ref 6.1–8.1)

## 2015-05-12 MED ORDER — TENOFOVIR DISOPROXIL FUMARATE 300 MG PO TABS: 300.0000 mg | ORAL_TABLET | Freq: Every day | ORAL | Status: DC

## 2015-05-12 MED ORDER — TRAZODONE HCL 50 MG PO TABS: 50.0000 mg | ORAL_TABLET | Freq: Every evening | ORAL | Status: DC | PRN

## 2015-05-12 NOTE — Progress Notes (Signed)
[name]: chornic hep b Subjective:    Patient ID: Gregory Singh, male    DOB: 1960-11-07, 55 y.o.   MRN: [id number]  HPI Gregory Singh is a 55yo [place] male with history of hypertriglyceridemia, alcohol use, and chronic hepatitis b. He was referred to clinic for management of chronic hepatitis b. He states that he does not recall his family members having hepatitis b. He denies any IVDU. He has self employed business. He does has a 2-3 drinks per night of beer mostly. He denies having withdrawals from alcohol.   Hep b surface ag + Hep b surface ab - Hep b e antigen - hepa b e antibody + Hep b viral load 48K  IMPRESSION for ultrasound in dec 2016 Liver is slightly echogenic suggesting fatty infiltration and/or hepatocellular disease. No focal hepatic abnormality identified. No gallstones or biliary distention.  Allergies  Allergen Reactions  . Ibuprofen Itching   Current Outpatient Prescriptions on File Prior to Visit  Medication Sig Dispense Refill  . metoprolol succinate (TOPROL-XL) 50 MG 24 hr tablet Take 1 tablet (50 mg total) by mouth daily. Take with or immediately following a meal. 90 tablet 3  . clobetasol ointment (TEMOVATE) 0.05 % Apply bid to rash on leg liberally, do not use on face or genitals (Patient not taking: Reported on 05/12/2015) 60 g 1   No current facility-administered medications on file prior to visit.   Active Ambulatory Problems    Diagnosis Date Noted  . Neck pain on right side 05/15/2011  . Neck pain 05/21/2011  . Shoulder pain 05/21/2011  . Thrombocytopenia (HCC) 01/10/2015  . Tremor of both hands 01/10/2015  . High triglycerides 01/10/2015  . Hepatitis B 01/19/2015   Resolved Ambulatory Problems    Diagnosis Date Noted  . No Resolved Ambulatory Problems   Past Medical History  Diagnosis Date  . Hypertension    Social History  Substance Use Topics  . Smoking status: Current Every Day Smoker -- 0.50 packs/day    Types: Cigarettes  . Smokeless tobacco:  Never Used  . Alcohol Use: 14.4 oz/week    24 Standard drinks or equivalent per week  family history includes Diabetes in his father; Stroke in his father.  Review of Systems  Constitutional: Negative for fever, chills, diaphoresis, activity change, appetite change, fatigue and unexpected weight change.  HENT: Negative for congestion, sore throat, rhinorrhea, sneezing, trouble swallowing and sinus pressure.  Eyes: Negative for photophobia and visual disturbance.  Respiratory: Negative for cough, chest tightness, shortness of breath, wheezing and stridor.  Cardiovascular: Negative for chest pain, palpitations and leg swelling.  Gastrointestinal: Negative for nausea, vomiting, abdominal pain, diarrhea, constipation, blood in stool, abdominal distention and anal bleeding.  Genitourinary: Negative for dysuria, hematuria, flank pain and difficulty urinating.  Musculoskeletal: Negative for myalgias, back pain, joint swelling, arthralgias and gait problem.  Skin: Negative for color change, pallor, rash and wound.  Neurological: Negative for dizziness, tremors, weakness and light-headedness.  Hematological: Negative for adenopathy. Does not bruise/bleed easily.  Psychiatric/Behavioral: poor sleep     Objective:   Physical Exam BP 143/77 mmHg  Pulse 78  Temp(Src) 98.7 F (37.1 C) (Oral)  Wt 133 lb (60.328 kg) Physical Exam  Constitutional: He is oriented to person, place, and time. He appears well-developed and well-nourished. No distress.  HENT:  Mouth/Throat: Oropharynx is clear and moist. No oropharyngeal exudate.  Cardiovascular: Normal rate, regular rhythm and normal heart sounds. Exam reveals no gallop and no friction rub.  No murmur heard.  Pulmonary/Chest: Effort normal and breath sounds normal. No respiratory distress. He has no wheezes.  Abdominal: Soft. Bowel sounds are normal. He exhibits no distension. There is no tenderness.  Lymphadenopathy:  He has no cervical adenopathy.    Neurological: He is alert and oriented to person, place, and time.  Skin: Skin is warm and dry. No rash noted. No erythema.  Psychiatric: He has a normal mood and affect. His behavior is normal.       Assessment & Plan:  Hep b = will start on viread, and check cmp and hep b e ab  Cirrhosis = recommend Less alcohol consumption  Smoking cessation = counseling was offered to attempt Less smoking  Insomnia = better sleep hygiene. Will try low dose trazodone

## 2015-05-12 NOTE — Progress Notes (Signed)
Patient ID: Erby Sanderson, male   DOB: [birth year], 55 y.o.   MRN: [id number] HPI: Martie Fulgham is a 55 y.o. male who is here for an eval for hep B management.  Allergies: Allergies  Allergen Reactions  . Ibuprofen Itching    Vitals: Temp: 98.7 F (37.1 C) (02/23 0859) Temp Source: Oral (02/23 0859) BP: 143/77 mmHg (02/23 0859) Pulse Rate: 78 (02/23 0859)  Past Medical History: Past Medical History  Diagnosis Date  . Hypertension     Social History: Social History   Social History  . Marital Status: Married    Spouse Name: N/A  . Number of Children: N/A  . Years of Education: N/A   Social History Main Topics  . Smoking status: Current Every Day Smoker -- 0.50 packs/day    Types: Cigarettes  . Smokeless tobacco: Never Used  . Alcohol Use: 14.4 oz/week    24 Standard drinks or equivalent per week  . Drug Use: No  . Sexual Activity: Not Asked   Other Topics Concern  . None   Social History Narrative    Previous Regimen: Naive  Current Regimen: None  Labs: HEPATITIS B SURFACE AG (no units)  Date Value  01/10/2015 POSITIVE*   HCV AB (no units)  Date Value  01/03/2015 NEGATIVE    CrCl: CrCl cannot be calculated (Patient has no serum creatinine result on file.).  Lipids:    Component Value Date/Time   CHOL 171 03/07/2015 0824   TRIG 646* 03/07/2015 0824   HDL 29* 03/07/2015 0824   CHOLHDL 5.9* 03/07/2015 0824   VLDL NOT CALC 03/07/2015 0824   LDLCALC NOT CALC 03/07/2015 0824    Assessment: 55 yo [place] gentleman who is here for his newly dx hep B. Family practice did do some blood work for his hep B. He has HbeAg neg so he could possibly has positive HbeAb since is VL is not super high. His LFTs are a little elevated. Dr. Drue Second is going to start him on Viread. We gave him a copay card if he has any copay.   Recommendations:  Start Viread  PO qday  Clide Cliff, PharmD Clinical Infectious Disease Pharmacist [facility] 05/12/2015, 1:53 PM

## 2015-05-16 ENCOUNTER — Telehealth: Payer: Self-pay | Admitting: *Deleted

## 2015-05-16 LAB — HEPATITIS B E ANTIBODY: HEPATITIS BE ANTIBODY: REACTIVE — AB

## 2015-05-16 NOTE — Telephone Encounter (Signed)
Patient called, stating his medication was rejected by insurance. RN left message at [facility] asking for the rejection. Andree Coss, RN

## 2015-05-17 NOTE — Telephone Encounter (Signed)
Patient called back to see if there was any new information.  Per [facility], the medication was rejected as it needed to be filled at a specialty pharmacy.  RN will contact them for further information: [phone].  Patient updated.

## 2015-05-18 NOTE — Telephone Encounter (Signed)
Spoke with [employer], provided patient information. They will call him and confirm insurance coverage, will send any rejections to [place] for authorization.

## 2015-05-20 ENCOUNTER — Telehealth: Payer: Self-pay | Admitting: *Deleted

## 2015-05-20 NOTE — Telephone Encounter (Signed)
Patient called and said he spoke to ClaytonMichelle, RN x 2 yesterday and today and was told she would check on his insurance for him to see which specialty pharmacy he need to use for his medication. Advised the patient to call insurance on back of his card and he said he could not because he is at work. He wanted her to be aware that he never received a call back. Wendall MolaJacqueline Alyn Jurney

## 2015-05-23 ENCOUNTER — Telehealth: Payer: Self-pay

## 2015-05-23 NOTE — Telephone Encounter (Signed)
Per previous notes documented in chart, the pharmacy is now in contact with the patient to resolve issues.  [facility]  They have sent him to patient assistance as he found the copay to be too high.  Per [facility], they are still trying to resolve this issue AND the patient is aware. Andree CossHowell, Johnita Palleschi M, RN

## 2015-05-23 NOTE — Telephone Encounter (Addendum)
Just recieved medication but name is different from what he was expecting .  He was expecting Viread and received tenofovir.  Please call.  Explained to patient this medication is correct he received the generic.   Laurell Josephsammy K Neelah Mannings, RN

## 2015-06-23 ENCOUNTER — Ambulatory Visit: Payer: BLUE CROSS/BLUE SHIELD | Admitting: Internal Medicine

## 2015-07-21 ENCOUNTER — Encounter: Payer: Self-pay | Admitting: Internal Medicine

## 2015-07-21 ENCOUNTER — Ambulatory Visit (INDEPENDENT_AMBULATORY_CARE_PROVIDER_SITE_OTHER): Payer: BLUE CROSS/BLUE SHIELD | Admitting: Internal Medicine

## 2015-07-21 VITALS — BP 115/67 | HR 59 | Temp 98.0°F | Ht 66.0 in | Wt 125.0 lb

## 2015-07-21 DIAGNOSIS — B181 Chronic viral hepatitis B without delta-agent: Secondary | ICD-10-CM

## 2015-07-21 DIAGNOSIS — E781 Pure hyperglyceridemia: Secondary | ICD-10-CM

## 2015-07-21 DIAGNOSIS — Z789 Other specified health status: Secondary | ICD-10-CM | POA: Diagnosis not present

## 2015-07-21 DIAGNOSIS — Z7289 Other problems related to lifestyle: Secondary | ICD-10-CM

## 2015-07-21 LAB — COMPLETE METABOLIC PANEL WITH GFR
ALT: 76 U/L — AB (ref 9–46)
AST: 111 U/L — ABNORMAL HIGH (ref 10–35)
Albumin: 4.4 g/dL (ref 3.6–5.1)
Alkaline Phosphatase: 85 U/L (ref 40–115)
BILIRUBIN TOTAL: 0.9 mg/dL (ref 0.2–1.2)
BUN: 9 mg/dL (ref 7–25)
CALCIUM: 9.3 mg/dL (ref 8.6–10.3)
CHLORIDE: 104 mmol/L (ref 98–110)
CO2: 26 mmol/L (ref 20–31)
CREATININE: 0.76 mg/dL (ref 0.70–1.33)
GFR, Est Non African American: >89 mL/min (ref >=60)
Glucose, Bld: 83 mg/dL (ref 65–99)
Potassium: 4 mmol/L (ref 3.5–5.3)
Sodium: 141 mmol/L (ref 135–146)
TOTAL PROTEIN: 7.1 g/dL (ref 6.1–8.1)

## 2015-07-22 LAB — HEPATITIS B DNA, ULTRAQUANTITATIVE, PCR: Hepatitis B DNA: <20 IU/mL (ref <20)

## 2015-07-31 NOTE — Progress Notes (Signed)
[name]: follow up for chronic hepatitis B Subjective:    Patient ID: Gregory HawkMike Singh, male    DOB: 01/07/1961, 55 y.o.   MRN: [id number]  HPI  55yo M with history of HLD,HTN, chronic hepatitis B started on tenofovir. He reports doing better in cutting back in alcohol intake. He reports drinking up to 2 coors/bud light per day.. Down from 4 drinks. He denies any difficulty with taking tenofovir. No episodes of jaundice nor episodes of alcohol withdrawal  He has switched insurances where he can be able to afford his tenofovir  Allergies  Allergen Reactions  . Ibuprofen Itching   Current Outpatient Prescriptions on File Prior to Visit  Medication Sig Dispense Refill  . clobetasol ointment (TEMOVATE) 0.05 % Apply bid to rash on leg liberally, do not use on face or genitals 60 g 1  . metoprolol succinate (TOPROL-XL) 50 MG 24 hr tablet Take 1 tablet (50 mg total) by mouth daily. Take with or immediately following a meal. 90 tablet 3  . tenofovir (VIREAD) 300 MG tablet Take 1 tablet (300 mg total) by mouth daily. 30 tablet 11  . traZODone (DESYREL) 50 MG tablet Take 1 tablet (50 mg total) by mouth at bedtime as needed for sleep. Initially try 1/2 tab per night 30 tablet 3   No current facility-administered medications on file prior to visit.   Active Ambulatory Problems    Diagnosis Date Noted  . Neck pain on right side 05/15/2011  . Neck pain 05/21/2011  . Shoulder pain 05/21/2011  . Thrombocytopenia (HCC) 01/10/2015  . Tremor of both hands 01/10/2015  . High triglycerides 01/10/2015  . Hepatitis B 01/19/2015   Resolved Ambulatory Problems    Diagnosis Date Noted  . No Resolved Ambulatory Problems   Past Medical History  Diagnosis Date  . Hypertension    Social History  Substance Use Topics  . Smoking status: Current Every Day Smoker -- 0.50 packs/day    Types: Cigarettes  . Smokeless tobacco: Never Used     Comment: cutting back  . Alcohol Use: 14.4 oz/week    24 Standard drinks or  equivalent per week     Comment: beer 2 daily    Review of Systems  Constitutional: Negative for fever, chills, diaphoresis, activity change, appetite change, fatigue and unexpected weight change.  HENT: Negative for congestion, sore throat, rhinorrhea, sneezing, trouble swallowing and sinus pressure.  Eyes: Negative for photophobia and visual disturbance.  Respiratory: Negative for cough, chest tightness, shortness of breath, wheezing and stridor.  Cardiovascular: Negative for chest pain, palpitations and leg swelling.  Gastrointestinal: Negative for nausea, vomiting, abdominal pain, diarrhea, constipation, blood in stool, abdominal distention and anal bleeding.  Genitourinary: Negative for dysuria, hematuria, flank pain and difficulty urinating.  Musculoskeletal: Negative for myalgias, back pain, joint swelling, arthralgias and gait problem.  Skin: Negative for color change, pallor, rash and wound.  Neurological: Negative for dizziness, tremors, weakness and light-headedness.  Hematological: Negative for adenopathy. Does not bruise/bleed easily.  Psychiatric/Behavioral: Negative for behavioral problems, confusion, sleep disturbance, dysphoric mood, decreased concentration and agitation.       Objective:   Physical Exam BP 115/67 mmHg  Pulse 59  Temp(Src) 98 F (36.7 C) (Oral)  Ht 5\' 6"  (1.676 m)  Wt 125 lb (56.7 kg)  BMI 20.19 kg/m2 Physical Exam  Constitutional: He is oriented to person, place, and time. He appears well-developed and well-nourished. No distress.  HENT:  Mouth/Throat: Oropharynx is clear and moist. No oropharyngeal exudate.  Cardiovascular:  Normal rate, regular rhythm and normal heart sounds. Exam reveals no gallop and no friction rub.  No murmur heard.  Pulmonary/Chest: Effort normal and breath sounds normal. No respiratory distress. He has no wheezes.  Abdominal: Soft. Bowel sounds are normal. He exhibits no distension. There is no tenderness.    Lymphadenopathy:  He has no cervical adenopathy.  Neurological: He is alert and oriented to person, place, and time.  Skin: Skin is warm and dry. No rash noted. No erythema.  Psychiatric: He has a normal mood and affect. His behavior is normal.        Assessment & Plan:  Chronic hepatitis B = will check cmp and hep b viral load. At next visit will need to do annual u/s to screen for liver cancer  Alcohol use = discussed advantages of decreasing alcohol use. He reports liking the taste, thus offered brands such as o'douls alcohol free drinks.  High triglyceridemia = recommend to include fish oil supplementation

## 2015-10-27 ENCOUNTER — Ambulatory Visit (INDEPENDENT_AMBULATORY_CARE_PROVIDER_SITE_OTHER): Payer: BLUE CROSS/BLUE SHIELD | Admitting: Internal Medicine

## 2015-10-27 ENCOUNTER — Encounter: Payer: Self-pay | Admitting: Internal Medicine

## 2015-10-27 VITALS — BP 133/72 | HR 54 | Temp 98.3°F | Wt 122.0 lb

## 2015-10-27 DIAGNOSIS — E781 Pure hyperglyceridemia: Secondary | ICD-10-CM | POA: Diagnosis not present

## 2015-10-27 DIAGNOSIS — Z7289 Other problems related to lifestyle: Secondary | ICD-10-CM

## 2015-10-27 DIAGNOSIS — Z789 Other specified health status: Secondary | ICD-10-CM | POA: Diagnosis not present

## 2015-10-27 DIAGNOSIS — I10 Essential (primary) hypertension: Secondary | ICD-10-CM

## 2015-10-27 DIAGNOSIS — B181 Chronic viral hepatitis B without delta-agent: Secondary | ICD-10-CM

## 2015-10-27 LAB — COMPLETE METABOLIC PANEL WITH GFR
ALT: 67 U/L — AB (ref 9–46)
AST: 97 U/L — ABNORMAL HIGH (ref 10–35)
Albumin: 4.2 g/dL (ref 3.6–5.1)
Alkaline Phosphatase: 85 U/L (ref 40–115)
BUN: 9 mg/dL (ref 7–25)
CALCIUM: 9.3 mg/dL (ref 8.6–10.3)
CHLORIDE: 104 mmol/L (ref 98–110)
CO2: 24 mmol/L (ref 20–31)
CREATININE: 0.76 mg/dL (ref 0.70–1.33)
GFR, Est Non African American: >89 mL/min (ref >=60)
Glucose, Bld: 72 mg/dL (ref 65–99)
Potassium: 3.9 mmol/L (ref 3.5–5.3)
Sodium: 139 mmol/L (ref 135–146)
Total Bilirubin: 0.8 mg/dL (ref 0.2–1.2)
Total Protein: 7 g/dL (ref 6.1–8.1)

## 2015-10-27 LAB — CBC WITH DIFFERENTIAL/PLATELET
BASOS ABS: 52 {cells}/uL (ref 0–200)
BASOS PCT: 1 %
EOS ABS: 156 {cells}/uL (ref 15–500)
Eosinophils Relative: 3 %
HEMATOCRIT: 47.8 % (ref 38.5–50.0)
Hemoglobin: 16.3 g/dL (ref 13.2–17.1)
LYMPHS PCT: 40 %
Lymphs Abs: 2080 cells/uL (ref 850–3900)
MCH: 35.3 pg — AB (ref 27.0–33.0)
MCHC: 34.1 g/dL (ref 32.0–36.0)
MCV: 103.5 fL — AB (ref 80.0–100.0)
MONO ABS: 520 {cells}/uL (ref 200–950)
MONOS PCT: 10 %
MPV: 11.3 fL (ref 7.5–12.5)
NEUTROS PCT: 46 %
Neutro Abs: 2392 cells/uL (ref 1500–7800)
PLATELETS: 105 10*3/uL — AB (ref 140–400)
RBC: 4.62 MIL/uL (ref 4.20–5.80)
RDW: 13.4 % (ref 11.0–15.0)
WBC: 5.2 10*3/uL (ref 3.8–10.8)

## 2015-10-27 LAB — LIPID PANEL
CHOL/HDL RATIO: 3.3 ratio (ref <=5.0)
CHOLESTEROL: 132 mg/dL (ref 125–200)
HDL: 40 mg/dL (ref >=40)
LDL Cholesterol: 34 mg/dL (ref <130)
TRIGLYCERIDES: 290 mg/dL — AB (ref <150)
VLDL: 58 mg/dL — AB (ref <30)

## 2015-10-27 NOTE — Progress Notes (Signed)
Patient ID: Gregory Singh, male   DOB: 07/30/1960, 55 y.o.   MRN: [id number]  HPI Gregory Singh is 55yo M with chronic hepatitis B without hepatic coma. He was started on tenofovir in early march 2017. He has viral suppression. He has steadily been trying to decrease his alcohol intake. He drinks 2 beers of bud/coors light per night. He is also being followed by pcp who is helping with cholesterol management. He reports being in good state of health otherwise  Outpatient Encounter Prescriptions as of 10/27/2015  Medication Sig  . clobetasol ointment (TEMOVATE) 0.05 % Apply bid to rash on leg liberally, do not use on face or genitals  . metoprolol succinate (TOPROL-XL) 50 MG 24 hr tablet Take 1 tablet (50 mg total) by mouth daily. Take with or immediately following a meal.  . tenofovir (VIREAD) 300 MG tablet Take 1 tablet (300 mg total) by mouth daily.  . traZODone (DESYREL) 50 MG tablet Take 1 tablet (50 mg total) by mouth at bedtime as needed for sleep. Initially try 1/2 tab per night (Patient not taking: Reported on 10/27/2015)   No facility-administered encounter medications on file as of 10/27/2015.      Patient Active Problem List   Diagnosis Date Noted  . Hepatitis B 01/19/2015  . Thrombocytopenia (HCC) 01/10/2015  . Tremor of both hands 01/10/2015  . High triglycerides 01/10/2015  . Neck pain 05/21/2011  . Shoulder pain 05/21/2011  . Neck pain on right side 05/15/2011     Health Maintenance Due  Topic Date Due  . TETANUS/TDAP  08/25/1981  . COLONOSCOPY  08/25/2012  . INFLUENZA VACCINE  10/18/2015     Review of Systems   Constitutional: Negative for fever, chills, diaphoresis, activity change, appetite change, fatigue and unexpected weight change.  HENT: Negative for congestion, sore throat, rhinorrhea, sneezing, trouble swallowing and sinus pressure.  Eyes: Negative for photophobia and visual disturbance.  Respiratory: Negative for cough, chest tightness, shortness of breath,  wheezing and stridor.  Cardiovascular: Negative for chest pain, palpitations and leg swelling.  Gastrointestinal: Negative for nausea, vomiting, abdominal pain, diarrhea, constipation, blood in stool, abdominal distention and anal bleeding.  Genitourinary: Negative for dysuria, hematuria, flank pain and difficulty urinating.  Musculoskeletal: Negative for myalgias, back pain, joint swelling, arthralgias and gait problem.  Skin: Negative for color change, pallor, rash and wound.  Neurological: Negative for dizziness, tremors, weakness and light-headedness.  Hematological: Negative for adenopathy. Does not bruise/bleed easily.  Psychiatric/Behavioral: Negative for behavioral problems, confusion, sleep disturbance, dysphoric mood, decreased concentration and agitation.    Physical Exam   BP 133/72 (BP Location: Right Arm, Patient Position: Sitting, Cuff Size: Normal)   Pulse (!) 54   Temp 98.3 F (36.8 C) (Oral)   Wt 122 lb (55.3 kg)   BMI 19.69 kg/m  Physical Exam  Constitutional: He is oriented to person, place, and time. He appears well-developed and well-nourished. No distress.  HENT:  Mouth/Throat: Oropharynx is clear and moist. No oropharyngeal exudate.  Cardiovascular: Normal rate, regular rhythm and normal heart sounds. Exam reveals no gallop and no friction rub.  No murmur heard.  Pulmonary/Chest: Effort normal and breath sounds normal. No respiratory distress. He has no wheezes.  Abdominal: Soft. Bowel sounds are normal. He exhibits no distension. There is no tenderness.  Lymphadenopathy:  He has no cervical adenopathy.  Neurological: He is alert and oriented to person, place, and time.  Skin: Skin is warm and dry. No rash noted. No erythema.  Psychiatric: He  has a normal mood and affect. His behavior is normal.    CBC Lab Results  Component Value Date   WBC 6.2 03/07/2015   RBC 4.51 03/07/2015   HGB 15.6 03/07/2015   HCT 45.9 03/07/2015   PLT 130 (L) 03/07/2015    MCV 101.8 (H) 03/07/2015   MCH 34.6 (H) 03/07/2015   MCHC 34.0 03/07/2015   RDW 12.9 03/07/2015   BMET Lab Results  Component Value Date   NA 141 07/21/2015   K 4.0 07/21/2015   CL 104 07/21/2015   CO2 26 07/21/2015   GLUCOSE 83 07/21/2015   BUN 9 07/21/2015   CREATININE 0.76 07/21/2015   CALCIUM 9.3 07/21/2015   GFRNONAA >89 07/21/2015   GFRAA >89 07/21/2015     Assessment and Plan  Chronic hepatitis b = Will check cmp, lipids, VL, continue on tenofovir for at least 6 months. Will reassess at next visit.  Alcohol use = Despite that he is consuming low alcohol content, still recommend to decrease intake.  Hypertriglyceridemia/low HDL = Will check lipids and ask to supplement with fish oil OTC 2 caps daily  HTN = Continue on metoprolol,

## 2015-11-02 LAB — HEPATITIS B DNA, ULTRAQUANTITATIVE, PCR
Hepatitis B DNA (Calc): <1.3 Log IU/mL (ref <1.30)
Hepatitis B DNA: <20 IU/mL (ref <20)

## 2015-11-21 ENCOUNTER — Other Ambulatory Visit: Payer: Self-pay | Admitting: Internal Medicine

## 2015-11-21 DIAGNOSIS — I10 Essential (primary) hypertension: Secondary | ICD-10-CM

## 2015-11-21 DIAGNOSIS — F17200 Nicotine dependence, unspecified, uncomplicated: Secondary | ICD-10-CM

## 2015-11-21 DIAGNOSIS — R251 Tremor, unspecified: Secondary | ICD-10-CM

## 2015-11-25 ENCOUNTER — Ambulatory Visit (INDEPENDENT_AMBULATORY_CARE_PROVIDER_SITE_OTHER): Payer: BLUE CROSS/BLUE SHIELD | Admitting: Physician Assistant

## 2015-11-25 DIAGNOSIS — R251 Tremor, unspecified: Secondary | ICD-10-CM

## 2015-11-25 DIAGNOSIS — I10 Essential (primary) hypertension: Secondary | ICD-10-CM

## 2015-11-25 DIAGNOSIS — F17200 Nicotine dependence, unspecified, uncomplicated: Secondary | ICD-10-CM

## 2015-11-25 DIAGNOSIS — Z720 Tobacco use: Secondary | ICD-10-CM

## 2015-11-25 MED ORDER — METOPROLOL SUCCINATE ER 50 MG PO TB24: 50.0000 mg | ORAL_TABLET | Freq: Every day | ORAL | 3 refills | Status: DC

## 2015-11-25 NOTE — Patient Instructions (Addendum)
Please return as soon as possible for your annual physical exam.  I am going to refill for 1 year, but I would like you to come back within 3-6 months for this. DASH Eating Plan DASH stands for "Dietary Approaches to Stop Hypertension." The DASH eating plan is a healthy eating plan that has been shown to reduce high blood pressure (hypertension). Additional health benefits may include reducing the risk of type 2 diabetes mellitus, heart disease, and stroke. The DASH eating plan may also help with weight loss. WHAT DO I NEED TO KNOW ABOUT THE DASH EATING PLAN? For the DASH eating plan, you will follow these general guidelines:  Choose foods with a percent daily value for sodium of less than 5% (as listed on the food label).  Use salt-free seasonings or herbs instead of table salt or sea salt.  Check with your health care provider or pharmacist before using salt substitutes.  Eat lower-sodium products, often labeled as "lower sodium" or "no salt added."  Eat fresh foods.  Eat more vegetables, fruits, and low-fat dairy products.  Choose whole grains. Look for the word "whole" as the first word in the ingredient list.  Choose fish and skinless chicken or Mal[place]urkey more often than red meat. Limit fish, poultry, and meat to 6 oz (170 g) each day.  Limit sweets, desserts, sugars, and sugary drinks.  Choose heart-healthy fats.  Limit cheese to 1 oz (28 g) per day.  Eat more home-cooked food and less restaurant, buffet, and fast food.  Limit fried foods.  Cook foods using methods other than frying.  Limit canned vegetables. If you do use them, rinse them well to decrease the sodium.  When eating at a restaurant, ask that your food be prepared with less salt, or no salt if possible. WHAT FOODS CAN I EAT? Seek help from a dietitian for individual calorie needs. Grains Whole grain or whole wheat bread. Brown rice. Whole grain or whole wheat pasta. Quinoa, bulgur, and whole grain cereals.  Low-sodium cereals. Corn or whole wheat flour tortillas. Whole grain cornbread. Whole grain crackers. Low-sodium crackers. Vegetables Fresh or frozen vegetables (raw, steamed, roasted, or grilled). Low-sodium or reduced-sodium tomato and vegetable juices. Low-sodium or reduced-sodium tomato sauce and paste. Low-sodium or reduced-sodium canned vegetables.  Fruits All fresh, canned (in natural juice), or frozen fruits. Meat and Other Protein Products Ground beef (85% or leaner), grass-fed beef, or beef trimmed of fat. Skinless chicken or [place]urkey. Ground chicken or [place]urkey. Pork trimmed of fat. All fish and seafood. Eggs. Dried beans, peas, or lentils. Unsalted nuts and seeds. Unsalted canned beans. Dairy Low-fat dairy products, such as skim or 1% milk, 2% or reduced-fat cheeses, low-fat ricotta or cottage cheese, or plain low-fat yogurt. Low-sodium or reduced-sodium cheeses. Fats and Oils Tub margarines without trans fats. Light or reduced-fat mayonnaise and salad dressings (reduced sodium). Avocado. Safflower, olive, or canola oils. Natural peanut or almond butter. Other Unsalted popcorn and pretzels. The items listed above may not be a complete list of recommended foods or beverages. Contact your dietitian for more options. WHAT FOODS ARE NOT RECOMMENDED? Grains White bread. White pasta. White rice. Refined cornbread. Bagels and croissants. Crackers that contain trans fat. Vegetables Creamed or fried vegetables. Vegetables in a cheese sauce. Regular canned vegetables. Regular canned tomato sauce and paste. Regular tomato and vegetable juices. Fruits Dried fruits. Canned fruit in light or heavy syrup. Fruit juice. Meat and Other Protein Products Fatty cuts of meat. Ribs, chicken wings, bacon, sausage, bologna,  salami, chitterlings, fatback, hot dogs, bratwurst, and packaged luncheon meats. Salted nuts and seeds. Canned beans with salt. Dairy Whole or 2% milk, cream, half-and-half, and cream  cheese. Whole-fat or sweetened yogurt. Full-fat cheeses or blue cheese. Nondairy creamers and whipped toppings. Processed cheese, cheese spreads, or cheese curds. Condiments Onion and garlic salt, seasoned salt, table salt, and sea salt. Canned and packaged gravies. Worcestershire sauce. Tartar sauce. Barbecue sauce. Teriyaki sauce. Soy sauce, including reduced sodium. Steak sauce. Fish sauce. Oyster sauce. Cocktail sauce. Horseradish. Ketchup and mustard. Meat flavorings and tenderizers. Bouillon cubes. Hot sauce. Tabasco sauce. Marinades. Taco seasonings. Relishes. Fats and Oils Butter, stick margarine, lard, shortening, ghee, and bacon fat. Coconut, palm kernel, or palm oils. Regular salad dressings. Other Pickles and olives. Salted popcorn and pretzels. The items listed above may not be a complete list of foods and beverages to avoid. Contact your dietitian for more information. WHERE CAN I FIND MORE INFORMATION? National Heart, Lung, and Blood Institute: [employer]   This information is not intended to replace advice given to you by your health care provider. Make sure you discuss any questions you have with your health care provider.   Document Released: 02/22/2011 Document Revised: 03/26/2014 Document Reviewed: 01/07/2013 Elsevier Interactive Patient Education 2016 [employer].    IF you received an x-ray today, you will receive an invoice from [facility]. Please contact [facility] at [phone] with questions or concerns regarding your invoice.   IF you received labwork today, you will receive an invoice from [employer]. Please contact [facility] at [phone] with questions or concerns regarding your invoice.   Our billing staff will not be able to assist you with questions regarding bills from these companies.  You will be contacted with the lab results as soon as they are available. The  fastest way to get your results is to activate your My Chart account. Instructions are located on the last page of this paperwork. If you have not heard from [place] regarding the results in 2 weeks, please contact this office.

## 2015-11-25 NOTE — Progress Notes (Signed)
Patient ID: Gregory Singh, male   DOB: 05-28-1960, 55 y.o.   MRN: [id number] [facility] [address] [place] [address] [phone]  By signing my name below, I, Essence Howell, attest that this documentation has been prepared under the direction and in the presence of Trena Platt, PA-C Electronically Signed: Charline Bills, ED Scribe 11/25/2015 at 6:09 PM.  Date:  11/25/2015   Name:  Gregory Singh   DOB:  1960-07-25   MRN:  [id number]  PCP:  No primary care provider on file.   History of Present Illness:  Gregory Singh is a 55 y.o. male patient, with a h/o HTN, of who presents to [facility] for a medication refill of metoprolol. Pt states that he has been compliant with the medication. He denies dizziness, chest pain, sob, leg swelling. Pt states that he watches his salt and meat intake. He states that he drinks 1 Coca Cola daily and 3 alcoholic beverages daily.   Patient Active Problem List   Diagnosis Date Noted   Hepatitis B 01/19/2015   Thrombocytopenia (HCC) 01/10/2015   Tremor of both hands 01/10/2015   High triglycerides 01/10/2015   Neck pain 05/21/2011   Shoulder pain 05/21/2011   Neck pain on right side 05/15/2011    Past Medical History:  Diagnosis Date   Hypertension     No past surgical history on file.  Social History  Substance Use Topics   Smoking status: Current Every Day Smoker    Packs/day: 0.50    Types: Cigarettes   Smokeless tobacco: Never Used     Comment: cutting back   Alcohol use 14.4 oz/week    24 Standard drinks or equivalent per week     Comment: beer 2 daily    Family History  Problem Relation Age of Onset   Diabetes Father    Stroke Father     Allergies  Allergen Reactions   Ibuprofen Itching    Medication list has been reviewed and updated.  Current Outpatient Prescriptions on File Prior to Visit  Medication Sig Dispense Refill   metoprolol succinate (TOPROL-XL) 50 MG 24 hr tablet Take 1 tablet (50 mg  total) by mouth daily. Take with or immediately following a meal. 90 tablet 3   tenofovir (VIREAD) 300 MG tablet Take 1 tablet (300 mg total) by mouth daily. 30 tablet 11   clobetasol ointment (TEMOVATE) 0.05 % Apply bid to rash on leg liberally, do not use on face or genitals (Patient not taking: Reported on 11/25/2015) 60 g 1   No current facility-administered medications on file prior to visit.     Review of Systems  Respiratory: Negative for shortness of breath.   Cardiovascular: Negative for chest pain and leg swelling.  Neurological: Negative for dizziness.    Physical Examination: BP 122/72 (BP Location: Right Arm, Patient Position: Sitting, Cuff Size: Normal)    Pulse (!) 57    Temp 98.6 F (37 C) (Oral)    Resp 17    Ht 5' 4.5" (1.638 m)    Wt 125 lb (56.7 kg)    SpO2 98%    BMI 21.12 kg/m  Ideal Body Weight: @FLOWAMB ([id number])@  Physical Exam  Constitutional: He is oriented to person, place, and time. He appears well-developed and well-nourished. No distress.  HENT:  Head: Normocephalic and atraumatic.  Eyes: Conjunctivae and EOM are normal. Pupils are equal, round, and reactive to light.  Cardiovascular: Normal rate, regular rhythm and normal heart sounds.  Pulmonary/Chest: Effort normal and breath sounds normal. No respiratory distress.  Neurological: He is alert and oriented to person, place, and time.  Skin: Skin is warm and dry. He is not diaphoretic.  Psychiatric: He has a normal mood and affect. His behavior is normal.    Assessment and Plan: Gregory Singh is a 55 y.o. male who is here today for medication refill of metoprolol. He is showing great improvement.  He is decreasing is smoking and I have acknowledged and encouraged this.  Also advised starting aerobic exercise. Also encouraged patient to decrease alcohol intake given hep b hx and it's overall excessiveness.  He voiced understanding.  He does not desire additional help at this time.  Smoker - Plan:  metoprolol succinate (TOPROL-XL) 50 MG 24 hr tablet  Tremor of both hands - Plan: metoprolol succinate (TOPROL-XL) 50 MG 24 hr tablet  Essential hypertension - Plan: metoprolol succinate (TOPROL-XL) 50 MG 24 hr tablet  Trena PlattStephanie English, PA-C [facility] 11/25/2015 6:08 PM

## 2016-01-26 ENCOUNTER — Encounter: Payer: Self-pay | Admitting: Internal Medicine

## 2016-01-26 ENCOUNTER — Ambulatory Visit (INDEPENDENT_AMBULATORY_CARE_PROVIDER_SITE_OTHER): Payer: BLUE CROSS/BLUE SHIELD | Admitting: Internal Medicine

## 2016-01-26 VITALS — BP 145/77 | HR 74 | Temp 97.1°F | Wt 128.0 lb

## 2016-01-26 DIAGNOSIS — B181 Chronic viral hepatitis B without delta-agent: Secondary | ICD-10-CM | POA: Diagnosis not present

## 2016-01-26 LAB — HEPATIC FUNCTION PANEL
ALK PHOS: 93 U/L (ref 40–115)
ALT: 50 U/L — AB (ref 9–46)
AST: 70 U/L — ABNORMAL HIGH (ref 10–35)
Albumin: 4.2 g/dL (ref 3.6–5.1)
BILIRUBIN DIRECT: 0.2 mg/dL (ref <=0.2)
BILIRUBIN INDIRECT: 0.5 mg/dL (ref 0.2–1.2)
TOTAL PROTEIN: 7.4 g/dL (ref 6.1–8.1)
Total Bilirubin: 0.7 mg/dL (ref 0.2–1.2)

## 2016-01-26 MED ORDER — TENOFOVIR ALAFENAMIDE FUMARATE 25 MG PO TABS: 1.0000 | ORAL_TABLET | Freq: Every day | ORAL | 11 refills | Status: DC

## 2016-01-26 NOTE — Progress Notes (Signed)
[name]: follow up for chronic hepatitis B Patient ID: Gregory HawkMike Singh, male   DOB: 02/09/1961, 55 y.o.   MRN: [id number]  HPI Gregory Singh is a 55yo M with chronic hepatitis B who has been on tenofovir for 8 months. His viral load has been undetectable and he attempts to decrease his alcohol intake where he drinks 2 beers, bud light, per night. He denies any acetominophen usage, no episodes of jaundice. He is wondering how to have his co-pay be kept at a minimum since his [employer] co-pay program is ending in December.  Outpatient Encounter Prescriptions as of 01/26/2016  Medication Sig  . clobetasol ointment (TEMOVATE) 0.05 % Apply bid to rash on leg liberally, do not use on face or genitals  . metoprolol succinate (TOPROL-XL) 50 MG 24 hr tablet Take 1 tablet (50 mg total) by mouth daily. Take with or immediately following a meal.  . tenofovir (VIREAD) 300 MG tablet Take 1 tablet (300 mg total) by mouth daily.   No facility-administered encounter medications on file as of 01/26/2016.      Patient Active Problem List   Diagnosis Date Noted  . Hepatitis B 01/19/2015  . Thrombocytopenia (HCC) 01/10/2015  . Tremor of both hands 01/10/2015  . High triglycerides 01/10/2015  . Neck pain 05/21/2011  . Shoulder pain 05/21/2011  . Neck pain on right side 05/15/2011     Health Maintenance Due  Topic Date Due  . TETANUS/TDAP  08/25/1981  . COLONOSCOPY  08/25/2012  . INFLUENZA VACCINE  10/18/2015     Review of Systems Review of Systems  Constitutional: Negative for fever, chills, diaphoresis, activity change, appetite change, fatigue and unexpected weight change.  HENT: Negative for congestion, sore throat, rhinorrhea, sneezing, trouble swallowing and sinus pressure.  Eyes: Negative for photophobia and visual disturbance.  Respiratory: Negative for cough, chest tightness, shortness of breath, wheezing and stridor.  Cardiovascular: Negative for chest pain, palpitations and leg swelling.  Gastrointestinal:  Negative for nausea, vomiting, abdominal pain, diarrhea, constipation, blood in stool, abdominal distention and anal bleeding.  Genitourinary: Negative for dysuria, hematuria, flank pain and difficulty urinating.  Musculoskeletal: Negative for myalgias, back pain, joint swelling, arthralgias and gait problem.  Skin: Negative for color change, pallor, rash and wound.  Neurological: Negative for dizziness, tremors, weakness and light-headedness.  Hematological: Negative for adenopathy. Does not bruise/bleed easily.  Psychiatric/Behavioral: Negative for behavioral problems, confusion, sleep disturbance, dysphoric mood, decreased concentration and agitation.    Physical Exam   BP (!) 145/77   Pulse 74   Temp 97.1 F (36.2 C) (Oral)   Wt 128 lb (58.1 kg)   BMI 21.63 kg/m  Physical Exam  Constitutional: He is oriented to person, place, and time. He appears well-developed and well-nourished. No distress.  HENT:  Mouth/Throat: Oropharynx is clear and moist. No oropharyngeal exudate.  Cardiovascular: Normal rate, regular rhythm and normal heart sounds. Exam reveals no gallop and no friction rub.  No murmur heard.  Pulmonary/Chest: Effort normal and breath sounds normal. No respiratory distress. He has no wheezes.  Abdominal: Soft. Bowel sounds are normal. He exhibits no distension. There is no tenderness.  Lymphadenopathy:  He has no cervical adenopathy.  Neurological: He is alert and oriented to person, place, and time. Slight tremor of hands noted Skin: Skin is warm and dry. No rash noted. No erythema.  Psychiatric: He has a normal mood and affect. His behavior is normal.   CBC Lab Results  Component Value Date   WBC 5.2 10/27/2015  RBC 4.62 10/27/2015   HGB 16.3 10/27/2015   HCT 47.8 10/27/2015   PLT 105 (L) 10/27/2015   MCV 103.5 (H) 10/27/2015   MCH 35.3 (H) 10/27/2015   MCHC 34.1 10/27/2015   RDW 13.4 10/27/2015   LYMPHSABS 2,080 10/27/2015   MONOABS 520 10/27/2015    EOSABS 156 10/27/2015   BASOSABS 52 10/27/2015   BMET Lab Results  Component Value Date   NA 139 10/27/2015   K 3.9 10/27/2015   CL 104 10/27/2015   CO2 24 10/27/2015   GLUCOSE 72 10/27/2015   BUN 9 10/27/2015   CREATININE 0.76 10/27/2015   CALCIUM 9.3 10/27/2015   GFRNONAA >89 10/27/2015   GFRAA >89 10/27/2015     Assessment and Plan   Chronic hepatitis B = will check hep b s ab, and hepatic panel. Will repeat imaging at next visit. We will plan to change him to TAF since that has efficacy for treatment of CHB as well as better insurance coverage for the patient  Alcohol use = down to 2 budweiser/day. Continue to have him decrease intake  Co pay assistance = [employer] in January when [employer] coverage ends  Health maintenance =deferred flu shot

## 2016-01-27 LAB — HEPATITIS B SURFACE ANTIBODY,QUALITATIVE: Hep B S Ab: NEGATIVE

## 2016-04-24 ENCOUNTER — Encounter: Payer: Self-pay | Admitting: Internal Medicine

## 2016-04-24 ENCOUNTER — Ambulatory Visit (INDEPENDENT_AMBULATORY_CARE_PROVIDER_SITE_OTHER): Payer: BLUE CROSS/BLUE SHIELD | Admitting: Internal Medicine

## 2016-04-24 ENCOUNTER — Telehealth: Payer: Self-pay | Admitting: *Deleted

## 2016-04-24 VITALS — BP 154/80 | HR 72 | Temp 98.5°F | Ht 66.0 in | Wt 132.0 lb

## 2016-04-24 DIAGNOSIS — D7589 Other specified diseases of blood and blood-forming organs: Secondary | ICD-10-CM | POA: Diagnosis not present

## 2016-04-24 DIAGNOSIS — B180 Chronic viral hepatitis B with delta-agent: Secondary | ICD-10-CM

## 2016-04-24 LAB — COMPLETE METABOLIC PANEL WITH GFR
ALT: 62 U/L — AB (ref 9–46)
AST: 97 U/L — AB (ref 10–35)
Albumin: 4.4 g/dL (ref 3.6–5.1)
Alkaline Phosphatase: 83 U/L (ref 40–115)
BUN: 9 mg/dL (ref 7–25)
CALCIUM: 9.1 mg/dL (ref 8.6–10.3)
CHLORIDE: 107 mmol/L (ref 98–110)
CO2: 23 mmol/L (ref 20–31)
Creat: 0.75 mg/dL (ref 0.70–1.33)
GFR, Est African American: >89 mL/min (ref >=60)
GFR, Est Non African American: >89 mL/min (ref >=60)
Glucose, Bld: 93 mg/dL (ref 65–99)
POTASSIUM: 4.1 mmol/L (ref 3.5–5.3)
SODIUM: 140 mmol/L (ref 135–146)
Total Bilirubin: 0.9 mg/dL (ref 0.2–1.2)
Total Protein: 7.3 g/dL (ref 6.1–8.1)

## 2016-04-24 LAB — CBC WITH DIFFERENTIAL/PLATELET
BASOS PCT: 1 %
Basophils Absolute: 53 cells/uL (ref 0–200)
EOS PCT: 2 %
Eosinophils Absolute: 106 cells/uL (ref 15–500)
HEMATOCRIT: 45.4 % (ref 38.5–50.0)
Hemoglobin: 15.3 g/dL (ref 13.2–17.1)
LYMPHS PCT: 36 %
Lymphs Abs: 1908 cells/uL (ref 850–3900)
MCH: 34.6 pg — ABNORMAL HIGH (ref 27.0–33.0)
MCHC: 33.7 g/dL (ref 32.0–36.0)
MCV: 102.7 fL — ABNORMAL HIGH (ref 80.0–100.0)
MONOS PCT: 9 %
MPV: 10.6 fL (ref 7.5–12.5)
Monocytes Absolute: 477 cells/uL (ref 200–950)
NEUTROS PCT: 52 %
Neutro Abs: 2756 cells/uL (ref 1500–7800)
PLATELETS: 115 10*3/uL — AB (ref 140–400)
RBC: 4.42 MIL/uL (ref 4.20–5.80)
RDW: 13.6 % (ref 11.0–15.0)
WBC: 5.3 10*3/uL (ref 3.8–10.8)

## 2016-04-24 NOTE — Progress Notes (Signed)
[name]: chronic hep B  Patient ID: Gregory Singh, male   DOB: 03/31/1960, 56 y.o.   MRN: [id number]  HPI 55yo M with chronic hepatitis b, and hx of HTN. 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 He has cut back significantly on smoking 1 pack lasts 4-5 days. Still 2 beers per day   - he provides for 15 members of his family.    Outpatient Encounter Prescriptions as of 04/24/2016  Medication Sig  . clobetasol ointment (TEMOVATE) 0.05 % Apply bid to rash on leg liberally, do not use on face or genitals  . metoprolol succinate (TOPROL-XL) 50 MG 24 hr tablet Take 1 tablet (50 mg total) by mouth daily. Take with or immediately following a meal.  . Tenofovir Alafenamide Fumarate (VEMLIDY) 25 MG TABS Take 1 tablet (25 mg total) by mouth daily.   No facility-administered encounter medications on file as of 04/24/2016.      Patient Active Problem List   Diagnosis Date Noted  . Hepatitis B 01/19/2015  . Thrombocytopenia (HCC) 01/10/2015  . Tremor of both hands 01/10/2015  . High triglycerides 01/10/2015  . Neck pain 05/21/2011  . Shoulder pain 05/21/2011  . Neck pain on right side 05/15/2011     Health Maintenance Due  Topic Date Due  . TETANUS/TDAP  08/25/1981  . COLONOSCOPY  08/25/2012  . INFLUENZA VACCINE  10/18/2015     Review of Systems Review of Systems  Constitutional: Negative for fever, chills, diaphoresis, activity change, appetite change, fatigue and unexpected weight change.  HENT: Negative for congestion, sore throat, rhinorrhea, sneezing, trouble swallowing and sinus pressure.  Eyes: Negative for photophobia and visual disturbance.  Respiratory: Negative for cough, chest tightness, shortness of breath, wheezing and stridor.  Cardiovascular: Negative for chest pain, palpitations and leg swelling.  Gastrointestinal: Negative for nausea, vomiting, abdominal pain, diarrhea, constipation, blood in stool, abdominal distention and  anal bleeding.  Genitourinary: Negative for dysuria, hematuria, flank pain and difficulty urinating.  Musculoskeletal: Negative for myalgias, back pain, joint swelling, arthralgias and gait problem.  Skin: Negative for color change, pallor, rash and wound.  Neurological: Negative for dizziness, tremors, weakness and light-headedness.  Hematological: Negative for adenopathy. Does not bruise/bleed easily.  Psychiatric/Behavioral: Negative for behavioral problems, confusion, sleep disturbance, dysphoric mood, decreased concentration and agitation.    Physical Exam   BP (!) 154/80   Pulse 72   Temp 98.5 F (36.9 C) (Oral)   Ht 5\' 6"  (1.676 m)   Wt 132 lb (59.9 kg)   BMI 21.31 kg/m   Physical Exam  Constitutional: He is oriented to person, place, and time. He appears well-developed and well-nourished. No distress.  HENT:  Mouth/Throat: Oropharynx is clear and moist. No oropharyngeal exudate.  Cardiovascular: Normal rate, regular rhythm and normal heart sounds. Exam reveals no gallop and no friction rub.  No murmur heard.  Pulmonary/Chest: Effort normal and breath sounds normal. No respiratory distress. He has no wheezes.  Abdominal: Soft. Bowel sounds are normal. He exhibits no distension. There is no tenderness.  Lymphadenopathy:  He has no cervical adenopathy.  Neurological: He is alert and oriented to person, place, and time.  Skin: Skin is warm and dry. No rash noted. No erythema.  Psychiatric: He has a normal mood and affect. His behavior is normal.     Lab Results  Component Value Date   HEPBSAB NEG 01/26/2016   CBC Lab Results  Component Value Date   WBC 5.2 10/27/2015  RBC 4.62 10/27/2015   HGB 16.3 10/27/2015   HCT 47.8 10/27/2015   PLT 105 (L) 10/27/2015   MCV 103.5 (H) 10/27/2015   MCH 35.3 (H) 10/27/2015   MCHC 34.1 10/27/2015   RDW 13.4 10/27/2015   LYMPHSABS 2,080 10/27/2015   MONOABS 520 10/27/2015   EOSABS 156 10/27/2015    BMET Lab Results    Component Value Date   NA 139 10/27/2015   K 3.9 10/27/2015   CL 104 10/27/2015   CO2 24 10/27/2015   GLUCOSE 72 10/27/2015   BUN 9 10/27/2015   CREATININE 0.76 10/27/2015   CALCIUM 9.3 10/27/2015   GFRNONAA >89 10/27/2015   GFRAA >89 10/27/2015      Assessment and Plan  Chronic hep b =  Will check hepatitis B viral load. continue with TAF. 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 Continue vitamin centrum, fish oil, and vitamin b  Alcohol use = he making strides to decrease alcohol intake. Commended him to keep decreasing intake  Tobacco dependence = has been decreasing and down to 1 pack per week

## 2016-04-24 NOTE — Telephone Encounter (Signed)
Notified patient of appt for ultrasound on 05/01/16 at 9:10 AM. [facility] at [facility], nothing to eat or drink after midnight.

## 2016-04-26 LAB — HEPATITIS B DNA, ULTRAQUANTITATIVE, PCR: Hepatitis B DNA (Calc): <1.3 Log IU/mL (ref <1.30)

## 2016-04-26 LAB — HEPATITIS B E ANTIBODY: Hepatitis Be Antibody: REACTIVE — AB

## 2016-05-01 ENCOUNTER — Ambulatory Visit: Payer: BLUE CROSS/BLUE SHIELD | Admitting: Internal Medicine

## 2016-05-01 ENCOUNTER — Ambulatory Visit
Admission: RE | Admit: 2016-05-01 | Discharge: 2016-05-01 | Disposition: A | Discharge status: Home / Self Care | Payer: BLUE CROSS/BLUE SHIELD | Source: Ambulatory Visit | Attending: Internal Medicine | Admitting: Internal Medicine

## 2016-05-01 DIAGNOSIS — B180 Chronic viral hepatitis B with delta-agent: Secondary | ICD-10-CM

## 2016-05-08 ENCOUNTER — Ambulatory Visit: Payer: BLUE CROSS/BLUE SHIELD | Admitting: Internal Medicine

## 2016-10-30 ENCOUNTER — Ambulatory Visit (INDEPENDENT_AMBULATORY_CARE_PROVIDER_SITE_OTHER): Payer: BLUE CROSS/BLUE SHIELD | Admitting: Internal Medicine

## 2016-10-30 VITALS — BP 107/66 | HR 56 | Temp 98.0°F | Wt 129.0 lb

## 2016-10-30 DIAGNOSIS — B181 Chronic viral hepatitis B without delta-agent: Secondary | ICD-10-CM | POA: Diagnosis not present

## 2016-10-30 NOTE — Progress Notes (Signed)
HPI: Myrtie HawkMike Court is a 56 y.o. male who is here to f/u with Dr. Drue SecondSnider for his hep B.   Allergies: Allergies  Allergen Reactions  . Ibuprofen Itching    Vitals: Temp: 98 F (36.7 C) (08/14 0849) Temp Source: Oral (08/14 0849) BP: 107/66 (08/14 0849) Pulse Rate: 56 (08/14 0849)  Past Medical History: Past Medical History:  Diagnosis Date  . Hypertension     Social History: Social History   Social History  . Marital status: Married    Spouse name: N/A  . Number of children: N/A  . Years of education: N/A   Social History Main Topics  . Smoking status: Current Every Day Smoker    Packs/day: 0.50    Types: Cigarettes  . Smokeless tobacco: Never Used     Comment: cutting back  . Alcohol use 14.4 oz/week    24 Standard drinks or equivalent per week     Comment: beer 2 daily  . Drug use: No  . Sexual activity: Not on file   Other Topics Concern  . Not on file   Social History Narrative  . No narrative on file    Previous Regimen: None  Current Regimen: Vemlidy  Labs: Hep B S Ab (no units)  Date Value  01/26/2016 NEG   Hepatitis B Surface Ag (no units)  Date Value  01/10/2015 POSITIVE (A)   HCV Ab (no units)  Date Value  01/03/2015 NEGATIVE    CrCl: CrCl cannot be calculated (Patient's most recent lab result is older than the maximum 21 days allowed.).  Lipids:    Component Value Date/Time   CHOL 132 10/27/2015 0912   TRIG 290 (H) 10/27/2015 0912   HDL 40 10/27/2015 0912   CHOLHDL 3.3 10/27/2015 0912   VLDL 58 (H) 10/27/2015 0912   LDLCALC 34 10/27/2015 0912    Assessment: [name] is doing well on Vemlidy for his hep B. His VL has been undetectable. He told Dr. Drue SecondSnider that he will be going to [place] for 2 months in Nov and was asking if there is a way for him to get 2 months supply of Vemlidy for the trip. Spoke with his insurance today and they said that they can do it but it has to be done at a date near his trip. I'll let [facility] knows  to request the override in Oct.   Recommendations:  Cont Vemlidy 1 daily  Ulyses SouthwardMinh Nalina Yeatman, PharmD, BCPS, AAHIVP, CPP Clinical Infectious Disease Pharmacist [facility] 10/30/2016, 10:59 AM  Addendum:  [facility] is aware now and will request for 2-3 months refill closer to the depart date.  Ulyses SouthwardMinh Mallisa Alameda, PharmD, BCPS, AAHIVP, CPP Infectious Disease Pharmacist Pager: [phone] 10/30/2016 11:11 AM

## 2016-10-30 NOTE — Progress Notes (Signed)
[name]: follow up for chronic hep b  Patient ID: Gregory Singh, male   DOB: 03/05/1961, 56 y.o.   MRN: [id number]  HPI  Gregory Singh is a 56yo M originally from [place],with chronic hepatitis b who is doing well with vemlidy taking daily not missing doses. He had RUQ U/S in Feb 2018 [facility]. He is going to [place] from early nov thru dec 22. Concern about access to meds. Otherwise, feels he is in good health.  Soc hx: attempting to cut down on beer drinking roughly 2-3 light beers when he drinks, not 3 nights per week Outpatient Encounter Prescriptions as of 10/30/2016  Medication Sig  . clobetasol ointment (TEMOVATE) 0.05 % Apply bid to rash on leg liberally, do not use on face or genitals  . metoprolol succinate (TOPROL-XL) 50 MG 24 hr tablet Take 1 tablet (50 mg total) by mouth daily. Take with or immediately following a meal.  . Tenofovir Alafenamide Fumarate (VEMLIDY) 25 MG TABS Take 1 tablet (25 mg total) by mouth daily.   No facility-administered encounter medications on file as of 10/30/2016.      Patient Active Problem List   Diagnosis Date Noted  . Hepatitis B 01/19/2015  . Thrombocytopenia (HCC) 01/10/2015  . Tremor of both hands 01/10/2015  . High triglycerides 01/10/2015  . Neck pain 05/21/2011  . Shoulder pain 05/21/2011  . Neck pain on right side 05/15/2011     Health Maintenance Due  Topic Date Due  . TETANUS/TDAP  08/25/1981  . COLONOSCOPY  08/25/2012  . INFLUENZA VACCINE  10/17/2016     Review of Systems Review of Systems  Constitutional: Negative for fever, chills, diaphoresis, activity change, appetite change, fatigue and unexpected weight change.  HENT: Negative for congestion, sore throat, rhinorrhea, sneezing, trouble swallowing and sinus pressure.  Eyes: Negative for photophobia and visual disturbance.  Respiratory: Negative for cough, chest tightness, shortness of breath, wheezing and stridor.  Cardiovascular:  Negative for chest pain, palpitations and leg swelling.  Gastrointestinal: Negative for nausea, vomiting, abdominal pain, diarrhea, constipation, blood in stool, abdominal distention and anal bleeding.  Genitourinary: Negative for dysuria, hematuria, flank pain and difficulty urinating.  Musculoskeletal: Negative for myalgias, back pain, joint swelling, arthralgias and gait problem.  Skin: Negative for color change, pallor, rash and wound.  Neurological: Negative for dizziness, tremors, weakness and light-headedness.  Hematological: Negative for adenopathy. Does not bruise/bleed easily.  Psychiatric/Behavioral: Negative for behavioral problems, confusion, sleep disturbance, dysphoric mood, decreased concentration and agitation.    Physical Exam   BP 107/66   Pulse (!) 56   Temp 98 F (36.7 C) (Oral)   Wt 129 lb (58.5 kg)   BMI 20.82 kg/m    Physical Exam  Constitutional: He is oriented to person, place, and time. He appears well-developed and well-nourished. No distress.  HENT:  Mouth/Throat: Oropharynx is clear and moist. No oropharyngeal exudate.  Cardiovascular: Normal rate, regular rhythm and normal heart sounds. Exam reveals no gallop and no friction rub.  No murmur heard.  Pulmonary/Chest: Effort normal and breath sounds normal. No respiratory distress. He has no wheezes.  Abdominal: Soft. Bowel sounds are normal. He exhibits no distension. There is no tenderness.  Skin: Skin is warm and dry. No rash noted. No erythema.  Psychiatric: He has a normal mood and affect. His behavior is normal.    Lab Results  Component Value Date   HEPBSAB NEG 01/26/2016   No results found for: RPR, LABRPR  CBC Lab Results  Component Value Date   WBC 5.3 04/24/2016   RBC 4.42 04/24/2016   HGB 15.3 04/24/2016   HCT 45.4 04/24/2016   PLT 115 (L) 04/24/2016   MCV 102.7 (H) 04/24/2016   MCH 34.6 (H) 04/24/2016   MCHC 33.7 04/24/2016   RDW 13.6 04/24/2016   LYMPHSABS 1,908 04/24/2016    MONOABS 477 04/24/2016   EOSABS 106 04/24/2016    BMET Lab Results  Component Value Date   NA 140 04/24/2016   K 4.1 04/24/2016   CL 107 04/24/2016   CO2 23 04/24/2016   GLUCOSE 93 04/24/2016   BUN 9 04/24/2016   CREATININE 0.75 04/24/2016   CALCIUM 9.1 04/24/2016   GFRNONAA >89 04/24/2016   GFRAA >89 04/24/2016      Assessment and Plan  Chronic hepatitis B = will check CMP, Hep B Viral Load and Hep B Surface ab at this visit. Otherwise continue to take TAF for treatment. He continues to tolerate. Will reach out to pharmacy to see if can get access to meds while on vacation rather than have treatment interruption  Health maintenance = continue to minimize alcohol or smoking. Recommend flu shot for this Fall/Winter  Spent 35 min in  Greater than 50% of time in counseling and management of hepatitis B

## 2016-10-31 LAB — COMPLETE METABOLIC PANEL WITH GFR
ALT: 69 U/L — AB (ref 9–46)
AST: 97 U/L — AB (ref 10–35)
Albumin: 4.3 g/dL (ref 3.6–5.1)
Alkaline Phosphatase: 84 U/L (ref 40–115)
BUN: 9 mg/dL (ref 7–25)
CALCIUM: 9.2 mg/dL (ref 8.6–10.3)
CHLORIDE: 104 mmol/L (ref 98–110)
CO2: 21 mmol/L (ref 20–32)
CREATININE: 0.83 mg/dL (ref 0.70–1.33)
GFR, Est African American: >89 mL/min (ref >=60)
GFR, Est Non African American: >89 mL/min (ref >=60)
GLUCOSE: 96 mg/dL (ref 65–99)
Potassium: 4.3 mmol/L (ref 3.5–5.3)
SODIUM: 139 mmol/L (ref 135–146)
Total Bilirubin: 0.9 mg/dL (ref 0.2–1.2)
Total Protein: 7.1 g/dL (ref 6.1–8.1)

## 2016-10-31 LAB — HEPATITIS B SURFACE ANTIBODY,QUALITATIVE: Hep B S Ab: NONREACTIVE

## 2016-11-02 LAB — HEPATITIS B DNA, ULTRAQUANTITATIVE, PCR: Hepatitis B DNA (Calc): <1 Log IU/mL

## 2016-11-16 ENCOUNTER — Other Ambulatory Visit: Payer: Self-pay | Admitting: Physician Assistant

## 2016-11-16 DIAGNOSIS — I10 Essential (primary) hypertension: Secondary | ICD-10-CM

## 2016-11-16 DIAGNOSIS — F17200 Nicotine dependence, unspecified, uncomplicated: Secondary | ICD-10-CM

## 2016-11-16 DIAGNOSIS — R251 Tremor, unspecified: Secondary | ICD-10-CM

## 2016-11-22 ENCOUNTER — Telehealth: Payer: Self-pay | Admitting: Family Medicine

## 2016-11-22 DIAGNOSIS — F17200 Nicotine dependence, unspecified, uncomplicated: Secondary | ICD-10-CM

## 2016-11-22 DIAGNOSIS — I10 Essential (primary) hypertension: Secondary | ICD-10-CM

## 2016-11-22 DIAGNOSIS — R251 Tremor, unspecified: Secondary | ICD-10-CM

## 2016-11-26 ENCOUNTER — Telehealth: Payer: Self-pay | Admitting: Physician Assistant

## 2016-11-26 NOTE — Telephone Encounter (Signed)
Was refilled 11/24/2016

## 2016-11-26 NOTE — Telephone Encounter (Signed)
Mychart message sent to pt about his CPE on 12/05/16 with English, her scheduled changed and his appt needs to be rescheduled

## 2016-11-27 MED ORDER — METOPROLOL SUCCINATE ER 50 MG PO TB24: 50.0000 mg | ORAL_TABLET | Freq: Every day | ORAL | 0 refills | Status: DC

## 2016-11-27 NOTE — Telephone Encounter (Signed)
Patient called office requesting refill for metformin. Per chart, medication last written 11/25/2015. Patient scheduled for appointment 11/2016. Patient given 30 days until he can come in for appointment./ S.Autumnrose Yore,CMA

## 2016-12-05 ENCOUNTER — Encounter: Payer: BLUE CROSS/BLUE SHIELD | Admitting: Physician Assistant

## 2016-12-11 ENCOUNTER — Encounter: Payer: Self-pay | Admitting: Physician Assistant

## 2016-12-11 ENCOUNTER — Ambulatory Visit (INDEPENDENT_AMBULATORY_CARE_PROVIDER_SITE_OTHER): Payer: BLUE CROSS/BLUE SHIELD | Admitting: Physician Assistant

## 2016-12-11 VITALS — BP 118/72 | HR 71 | Temp 98.7°F | Resp 16 | Ht 65.0 in | Wt 130.6 lb

## 2016-12-11 DIAGNOSIS — Z130 Encounter for screening for diseases of the blood and blood-forming organs and certain disorders involving the immune mechanism: Secondary | ICD-10-CM | POA: Diagnosis not present

## 2016-12-11 DIAGNOSIS — R21 Rash and other nonspecific skin eruption: Secondary | ICD-10-CM | POA: Diagnosis not present

## 2016-12-11 DIAGNOSIS — Z23 Encounter for immunization: Secondary | ICD-10-CM | POA: Diagnosis not present

## 2016-12-11 DIAGNOSIS — Z122 Encounter for screening for malignant neoplasm of respiratory organs: Secondary | ICD-10-CM

## 2016-12-11 DIAGNOSIS — Z13228 Encounter for screening for other metabolic disorders: Secondary | ICD-10-CM | POA: Diagnosis not present

## 2016-12-11 DIAGNOSIS — Z13220 Encounter for screening for lipoid disorders: Secondary | ICD-10-CM | POA: Diagnosis not present

## 2016-12-11 DIAGNOSIS — Z0000 Encounter for general adult medical examination without abnormal findings: Secondary | ICD-10-CM

## 2016-12-11 DIAGNOSIS — Z125 Encounter for screening for malignant neoplasm of prostate: Secondary | ICD-10-CM

## 2016-12-11 DIAGNOSIS — R251 Tremor, unspecified: Secondary | ICD-10-CM

## 2016-12-11 DIAGNOSIS — Z1211 Encounter for screening for malignant neoplasm of colon: Secondary | ICD-10-CM

## 2016-12-11 DIAGNOSIS — F17200 Nicotine dependence, unspecified, uncomplicated: Secondary | ICD-10-CM | POA: Diagnosis not present

## 2016-12-11 DIAGNOSIS — I10 Essential (primary) hypertension: Secondary | ICD-10-CM | POA: Diagnosis not present

## 2016-12-11 DIAGNOSIS — Z1329 Encounter for screening for other suspected endocrine disorder: Secondary | ICD-10-CM

## 2016-12-11 MED ORDER — TRIAMCINOLONE ACETONIDE 0.5 % EX OINT: 1.0000 "application " | TOPICAL_OINTMENT | Freq: Two times a day (BID) | CUTANEOUS | 0 refills | Status: DC

## 2016-12-11 MED ORDER — METOPROLOL SUCCINATE ER 50 MG PO TB24: 50.0000 mg | ORAL_TABLET | Freq: Every day | ORAL | 3 refills | Status: DC

## 2016-12-11 NOTE — Progress Notes (Signed)
PRIMARY CARE AT [facility] [address] [place] [address] [phone]  Date:  12/11/2016   Name:  Ash Mcelwain   DOB:  [birth year]   MRN:  [id number]  PCP:  Patient, No Pcp Per    History of Present Illness:  Madox Corkins is a 56 y.o. male patient who presents to PCP with  Chief Complaint  Patient presents with  . Annual Exam    needs refills for Temovate and Metoprolol     DIET: eating without restrictions, rare fried.  He is eating pork, but more vegetables and fish.  Water intake: 64oz of water.  Rare soda--coke.  Besides this, no caffeine.    BM: normal.  No blood or black stool.  No constipatino or diarrhea  URINATION: no dysuria, hematuria, or frequency.    SLEEP: normal.  6-8 hours.   SOCIAL ACTIVITY: working overtime mostly, watching football and basketball, watching movies.   Exercise: none at this time.  He states he has no time.  Works with refurbishing homes.  Sexually active.  No difficulties.   Patient will be leaving for [place].   Would like refill of bp medication.  Takes compliantly.   No chest pains, palpitations, or sob.      Patient Active Problem List   Diagnosis Date Noted  . Hepatitis B 01/19/2015  . Thrombocytopenia (HCC) 01/10/2015  . Tremor of both hands 01/10/2015  . High triglycerides 01/10/2015  . Neck pain 05/21/2011  . Shoulder pain 05/21/2011  . Neck pain on right side 05/15/2011    Past Medical History:  Diagnosis Date  . Hypertension     No past surgical history on file.  Social History  Substance Use Topics  . Smoking status: Current Every Day Smoker    Packs/day: 0.50    Types: Cigarettes  . Smokeless tobacco: Never Used     Comment: cutting back  . Alcohol use 14.4 oz/week    24 Standard drinks or equivalent per week     Comment: beer 2 daily    Family History  Problem Relation Age of Onset  . Diabetes Father   . Stroke Father     Allergies  Allergen Reactions  . Ibuprofen Itching    Medication list has been  reviewed and updated.  Current Outpatient Prescriptions on File Prior to Visit  Medication Sig Dispense Refill  . clobetasol ointment (TEMOVATE) 0.05 % Apply bid to rash on leg liberally, do not use on face or genitals 60 g 1  . metoprolol succinate (TOPROL-XL) 50 MG 24 hr tablet Take 1 tablet (50 mg total) by mouth daily. Take with or immediately following a meal. 30 tablet 0  . Tenofovir Alafenamide Fumarate (VEMLIDY) 25 MG TABS Take 1 tablet (25 mg total) by mouth daily. (Patient not taking: Reported on 12/11/2016) 30 tablet 11   No current facility-administered medications on file prior to visit.     ROS ROS otherwise unremarkable unless listed above.  Physical Examination: BP 118/72 (BP Location: Left Arm, Patient Position: Sitting, Cuff Size: Normal)   Pulse 71   Temp 98.7 F (37.1 C) (Oral)   Resp 16   Ht 5\' 5"  (1.651 m)   Wt 130 lb 9.6 oz (59.2 kg)   SpO2 94%   BMI 21.73 kg/m  Ideal Body Weight: Weight in (lb) to have BMI = 25: 149.9  Physical Exam  Constitutional: He is oriented to person, place, and time. He appears well-developed and well-nourished. No distress.  HENT:  Head:  Normocephalic and atraumatic.  Right Ear: Tympanic membrane, external ear and ear canal normal.  Left Ear: Tympanic membrane, external ear and ear canal normal.  Eyes: Pupils are equal, round, and reactive to light. Conjunctivae and EOM are normal.  Cardiovascular: Normal rate and regular rhythm.  Exam reveals no friction rub.   No murmur heard. Pulmonary/Chest: Effort normal. No respiratory distress. He has no wheezes.  Abdominal: Soft. Bowel sounds are normal. He exhibits no distension and no mass. There is no tenderness.  Right lower quadrant scar.   Left lower leg hyperpigmented scar.   Musculoskeletal: Normal range of motion. He exhibits no edema or tenderness.  Neurological: He is alert and oriented to person, place, and time. He displays normal reflexes.  Skin: Skin is warm and dry. He  is not diaphoretic.  Psychiatric: He has a normal mood and affect. His behavior is normal.     Assessment and Plan: Cosmo Tetreault is a 56 y.o. male who is here today for cc of  Chief Complaint  Patient presents with  . Annual Exam    needs refills for Temovate and Metoprolol   Stable blood pressure: fill for 1 year.  Return in 6 months Advised of topical steroid use.  No more than 2 weeks at a time. Switching to triamcinolone. Agreed to have colonoscopy.  Agreed to have lung cancer screening with low dose ct scan.   tdap given today Annual physical exam - Plan: CBC, CMP14+EGFR, Lipid panel, PSA, TSH, CT CHEST LUNG CA SCREEN LOW DOSE W/O CM  Smoker - Plan: metoprolol succinate (TOPROL-XL) 50 MG 24 hr tablet  Tremor of both hands - Plan: metoprolol succinate (TOPROL-XL) 50 MG 24 hr tablet  Essential hypertension - Plan: metoprolol succinate (TOPROL-XL) 50 MG 24 hr tablet  Rash and nonspecific skin eruption - Plan: triamcinolone ointment (KENALOG) 0.5 %  Need for Tdap vaccination - Plan: Tdap vaccine greater than or equal to 7yo IM  Encounter for screening colonoscopy - Plan: Ambulatory referral to Gastroenterology  Screening for deficiency anemia - Plan: CBC  Screening for lipid disorders - Plan: Lipid panel  Screening for thyroid disorder - Plan: TSH  Screening for prostate cancer - Plan: PSA  Screening for metabolic disorder - Plan: CMP14+EGFR  Encounter for screening for lung cancer - Plan: CT CHEST LUNG CA SCREEN LOW DOSE W/O CM  Encounter for screening for malignant neoplasm of respiratory organs - Plan: CT CHEST LUNG CA SCREEN LOW DOSE W/O CM  Ivar Drape, PA-C [facility] 9/25/201810:28 AM

## 2016-12-12 LAB — CBC
Hematocrit: 45.3 % (ref 37.5–51.0)
Hemoglobin: 15.6 g/dL (ref 13.0–17.7)
MCH: 35.1 pg — ABNORMAL HIGH (ref 26.6–33.0)
MCHC: 34.4 g/dL (ref 31.5–35.7)
MCV: 102 fL — ABNORMAL HIGH (ref 79–97)
PLATELETS: 116 10*3/uL — AB (ref 150–379)
RBC: 4.44 x10E6/uL (ref 4.14–5.80)
RDW: 13.3 % (ref 12.3–15.4)
WBC: 5.2 10*3/uL (ref 3.4–10.8)

## 2016-12-12 LAB — CMP14+EGFR
ALT: 71 IU/L — AB (ref 0–44)
AST: 117 IU/L — AB (ref 0–40)
Albumin/Globulin Ratio: 1.6 (ref 1.2–2.2)
Albumin: 4.5 g/dL (ref 3.5–5.5)
Alkaline Phosphatase: 82 IU/L (ref 39–117)
BUN/Creatinine Ratio: 12 (ref 9–20)
BUN: 9 mg/dL (ref 6–24)
Bilirubin Total: 1 mg/dL (ref 0.0–1.2)
CALCIUM: 9.2 mg/dL (ref 8.7–10.2)
CO2: 20 mmol/L (ref 20–29)
CREATININE: 0.77 mg/dL (ref 0.76–1.27)
Chloride: 105 mmol/L (ref 96–106)
GFR, EST AFRICAN AMERICAN: 119 mL/min/{1.73_m2} (ref >59)
GFR, EST NON AFRICAN AMERICAN: 103 mL/min/{1.73_m2} (ref >59)
GLUCOSE: 100 mg/dL — AB (ref 65–99)
Globulin, Total: 2.9 g/dL (ref 1.5–4.5)
POTASSIUM: 4.2 mmol/L (ref 3.5–5.2)
Sodium: 143 mmol/L (ref 134–144)
TOTAL PROTEIN: 7.4 g/dL (ref 6.0–8.5)

## 2016-12-12 LAB — LIPID PANEL
CHOL/HDL RATIO: 4.3 ratio (ref 0.0–5.0)
CHOLESTEROL TOTAL: 156 mg/dL (ref 100–199)
HDL: 36 mg/dL — AB (ref >39)
TRIGLYCERIDES: 428 mg/dL — AB (ref 0–149)

## 2016-12-12 LAB — PSA: Prostate Specific Ag, Serum: 0.6 ng/mL (ref 0.0–4.0)

## 2016-12-12 LAB — TSH: TSH: 2.37 u[IU]/mL (ref 0.450–4.500)

## 2016-12-25 ENCOUNTER — Telehealth: Payer: Self-pay | Admitting: Physician Assistant

## 2016-12-25 NOTE — Telephone Encounter (Signed)
Pt's CT Chest did not get approved due to pt not being between the age of 52-80. The following have to all be met for CT to be clinically appropriate according to [facility].  The use of CT for annual screening of lung cancer in high-risk patients is indicated when all of the following are met: Patient has no signs or symptoms suggestive of underlying cancer. Patients age is equal to or greater than 55 and less than or equal to 34. There is at least a 30 pack-year history of cigarette smoking (and if former smoker, quit date is within the previous 15 years). Patient does not have a health problem that substantially limits life expectancy or the ability/willingness to have curative treatment.  This case is under review and is to close on 12/27/16. [facility] can be contacted at [phone] to discuss this decision. Thanks.

## 2016-12-26 NOTE — Telephone Encounter (Signed)
Order canceled. Thanks!

## 2016-12-26 NOTE — Telephone Encounter (Signed)
Thank you.  I missed the age thing.  We can cancel

## 2017-01-07 ENCOUNTER — Other Ambulatory Visit: Payer: Self-pay | Admitting: Internal Medicine

## 2017-01-07 DIAGNOSIS — B1910 Unspecified viral hepatitis B without hepatic coma: Secondary | ICD-10-CM

## 2017-01-09 ENCOUNTER — Telehealth: Payer: Self-pay | Admitting: Physician Assistant

## 2017-01-15 ENCOUNTER — Other Ambulatory Visit: Payer: Self-pay | Admitting: Physician Assistant

## 2017-01-15 DIAGNOSIS — E781 Pure hyperglyceridemia: Secondary | ICD-10-CM

## 2017-01-15 MED ORDER — ATORVASTATIN CALCIUM 40 MG PO TABS: 40.0000 mg | ORAL_TABLET | Freq: Every day | ORAL | 1 refills | Status: DC

## 2017-01-15 MED ORDER — OMEGA-3-ACID ETHYL ESTERS 1 G PO CAPS: 2.0000 g | ORAL_CAPSULE | Freq: Every day | ORAL | 2 refills | Status: DC

## 2017-01-15 NOTE — Telephone Encounter (Signed)
error 

## 2017-05-21 ENCOUNTER — Encounter: Payer: Self-pay | Admitting: Internal Medicine

## 2017-05-21 ENCOUNTER — Ambulatory Visit (INDEPENDENT_AMBULATORY_CARE_PROVIDER_SITE_OTHER): Payer: BLUE CROSS/BLUE SHIELD | Admitting: Internal Medicine

## 2017-05-21 VITALS — BP 128/73 | HR 73 | Temp 98.3°F | Ht 66.0 in | Wt 136.0 lb

## 2017-05-21 DIAGNOSIS — B181 Chronic viral hepatitis B without delta-agent: Secondary | ICD-10-CM

## 2017-05-21 DIAGNOSIS — Z7289 Other problems related to lifestyle: Secondary | ICD-10-CM

## 2017-05-21 DIAGNOSIS — Z789 Other specified health status: Secondary | ICD-10-CM

## 2017-05-21 NOTE — Progress Notes (Signed)
[name]: follow up for chronic hep B  Patient ID: Gregory Singh, male   DOB: 12/12/1960, 57 y.o.   MRN: [id number]  HPI Gregory Singh is a 57yo M with chronic hepatitis B on TAF. He states that he is doing well Continues to drink 2 beers budweiser daily. Has decreased from the past Overall healthy no recent illnesses  Sochx: still smoking 1/4 ppd Outpatient Encounter Medications as of 05/21/2017  Medication Sig  . clobetasol ointment (TEMOVATE) 0.05 % Apply bid to rash on leg liberally, do not use on face or genitals  . metoprolol succinate (TOPROL-XL) 50 MG 24 hr tablet Take 1 tablet (50 mg total) by mouth daily. Take with or immediately following a meal.  . omega-3 acid ethyl esters (LOVAZA) 1 g capsule Take 2 capsules (2 g total) by mouth daily.  Marland Kitchen. triamcinolone ointment (KENALOG) 0.5 % Apply 1 application topically 2 (two) times daily.  . VEMLIDY 25 MG TABS TAKE ONE TABLET BY MOUTH EVERY DAY   No facility-administered encounter medications on file as of 05/21/2017.      Patient Active Problem List   Diagnosis Date Noted  . Hepatitis B 01/19/2015  . Thrombocytopenia (HCC) 01/10/2015  . Tremor of both hands 01/10/2015  . High triglycerides 01/10/2015  . Neck pain 05/21/2011  . Shoulder pain 05/21/2011  . Neck pain on right side 05/15/2011     Health Maintenance Due  Topic Date Due  . COLONOSCOPY  08/25/2012     Review of Systems  Constitutional: Negative for fever, chills, diaphoresis, activity change, appetite change, fatigue and unexpected weight change.  HENT: Negative for congestion, sore throat, rhinorrhea, sneezing, trouble swallowing and sinus pressure.  Eyes: Negative for photophobia and visual disturbance.  Respiratory: Negative for cough, chest tightness, shortness of breath, wheezing and stridor.  Cardiovascular: Negative for chest pain, palpitations and leg swelling.  Gastrointestinal: Negative for nausea, vomiting, abdominal pain, diarrhea, constipation, blood in stool,  abdominal distention and anal bleeding.  Genitourinary: Negative for dysuria, hematuria, flank pain and difficulty urinating.  Musculoskeletal: Negative for myalgias, back pain, joint swelling, arthralgias and gait problem.  Skin: Negative for color change, pallor, rash and wound.  Neurological: Negative for dizziness, tremors, weakness and light-headedness.  Hematological: Negative for adenopathy. Does not bruise/bleed easily.  Psychiatric/Behavioral: Negative for behavioral problems, confusion, sleep disturbance, dysphoric mood, decreased concentration and agitation.    Physical Exam   BP 128/73   Pulse 73   Temp 98.3 F (36.8 C) (Oral)   Ht 5\' 6"  (1.676 m)   Wt 136 lb (61.7 kg)   BMI 21.95 kg/m   Physical Exam  Constitutional: He is oriented to person, place, and time. He appears well-developed and well-nourished. No distress.  HENT:  Mouth/Throat: Oropharynx is clear and moist. No oropharyngeal exudate.  Cardiovascular: Normal rate, regular rhythm and normal heart sounds. Exam reveals no gallop and no friction rub.  No murmur heard.  Pulmonary/Chest: Effort normal and breath sounds normal. No respiratory distress. He has no wheezes.  Abdominal: Soft. Bowel sounds are normal. He exhibits no distension. There is no tenderness.  Lymphadenopathy:  He has no cervical adenopathy.  Neurological: He is alert and oriented to person, place, and time.  Skin: Skin is warm and dry. No rash noted. No erythema.  Psychiatric: He has a normal mood and affect. His behavior is normal.    Lab Results  Component Value Date   HEPBSAB NON-REACTIVE 10/30/2016   No results found for: RPR, LABRPR  CBC Lab Results  Component Value Date   WBC 5.2 12/11/2016   RBC 4.44 12/11/2016   HGB 15.6 12/11/2016   HCT 45.3 12/11/2016   PLT 116 (L) 12/11/2016   MCV 102 (H) 12/11/2016   MCH 35.1 (H) 12/11/2016   MCHC 34.4 12/11/2016   RDW 13.3 12/11/2016   LYMPHSABS 1,908 04/24/2016   MONOABS 477  04/24/2016   EOSABS 106 04/24/2016    BMET Lab Results  Component Value Date   NA 143 12/11/2016   K 4.2 12/11/2016   CL 105 12/11/2016   CO2 20 12/11/2016   GLUCOSE 100 (H) 12/11/2016   BUN 9 12/11/2016   CREATININE 0.77 12/11/2016   CALCIUM 9.2 12/11/2016   GFRNONAA 103 12/11/2016   GFRAA 119 12/11/2016      Assessment and Plan  Chronic hepatitis b = we will check labs to see if still viral suppression as well as to see if developing hep b s ab Will do lab work Will get RUQ u/s for hcc surveillance  Alcohol use = discussed cutting back as well as counseling for smoking cessation

## 2017-05-23 LAB — HEPATITIS B DNA, ULTRAQUANTITATIVE, PCR: Hepatitis B DNA: <10 IU/mL

## 2017-05-27 LAB — HEPATITIS B SURFACE ANTIBODY,QUALITATIVE: Hep B S Ab: NONREACTIVE

## 2017-05-27 LAB — COMPLETE METABOLIC PANEL WITH GFR
AG Ratio: 1.5 (calc) (ref 1.0–2.5)
ALKALINE PHOSPHATASE (APISO): 105 U/L (ref 40–115)
ALT: 73 U/L — AB (ref 9–46)
AST: 103 U/L — AB (ref 10–35)
Albumin: 4.2 g/dL (ref 3.6–5.1)
BUN: 9 mg/dL (ref 7–25)
CO2: 26 mmol/L (ref 20–32)
CREATININE: 0.83 mg/dL (ref 0.70–1.33)
Calcium: 9.2 mg/dL (ref 8.6–10.3)
Chloride: 108 mmol/L (ref 98–110)
GFR, Est African American: 116 mL/min/{1.73_m2} (ref >=60)
GFR, Est Non African American: 100 mL/min/{1.73_m2} (ref >=60)
GLUCOSE: 114 mg/dL — AB (ref 65–99)
Globulin: 2.8 g/dL (calc) (ref 1.9–3.7)
Potassium: 3.7 mmol/L (ref 3.5–5.3)
Sodium: 142 mmol/L (ref 135–146)
Total Bilirubin: 0.9 mg/dL (ref 0.2–1.2)
Total Protein: 7 g/dL (ref 6.1–8.1)

## 2017-05-27 LAB — HEPATITIS B E ANTIGEN: Hep B E Ag: NONREACTIVE

## 2017-05-27 LAB — HEPATITIS B E ANTIBODY: Hep B E Ab: REACTIVE — AB

## 2017-06-26 ENCOUNTER — Encounter: Payer: Self-pay | Admitting: Physician Assistant

## 2017-06-26 ENCOUNTER — Other Ambulatory Visit: Payer: Self-pay | Admitting: Internal Medicine

## 2017-06-26 DIAGNOSIS — B1910 Unspecified viral hepatitis B without hepatic coma: Secondary | ICD-10-CM

## 2017-07-31 ENCOUNTER — Other Ambulatory Visit: Payer: Self-pay | Admitting: Pharmacist Clinician (PhC)/ Clinical Pharmacy Specialist

## 2017-07-31 MED ORDER — TENOFOVIR DISOPROXIL FUMARATE 300 MG PO TABS
300.0000 mg | ORAL_TABLET | Freq: Every day | ORAL | 6 refills | Status: DC
Start: 2017-07-31 — End: 2018-01-29

## 2017-07-31 NOTE — Progress Notes (Signed)
Copay for Gregory Singh is $400 due to the exhaustion of the copay card. Dr Drue Second with ok doing the generic TDF to get the copay to $61/mo.

## 2017-10-23 ENCOUNTER — Ambulatory Visit (INDEPENDENT_AMBULATORY_CARE_PROVIDER_SITE_OTHER): Payer: BLUE CROSS/BLUE SHIELD

## 2017-10-23 ENCOUNTER — Ambulatory Visit (INDEPENDENT_AMBULATORY_CARE_PROVIDER_SITE_OTHER): Payer: BLUE CROSS/BLUE SHIELD | Admitting: Physician Assistant

## 2017-10-23 ENCOUNTER — Encounter: Payer: Self-pay | Admitting: Physician Assistant

## 2017-10-23 ENCOUNTER — Other Ambulatory Visit: Payer: Self-pay

## 2017-10-23 VITALS — BP 120/80 | HR 73 | Temp 98.8°F | Resp 16 | Ht 66.0 in | Wt 128.8 lb

## 2017-10-23 DIAGNOSIS — R202 Paresthesia of skin: Secondary | ICD-10-CM

## 2017-10-23 DIAGNOSIS — R200 Anesthesia of skin: Secondary | ICD-10-CM

## 2017-10-23 DIAGNOSIS — M62838 Other muscle spasm: Secondary | ICD-10-CM | POA: Diagnosis not present

## 2017-10-23 MED ORDER — CYCLOBENZAPRINE HCL 5 MG PO TABS: 5.0000 mg | ORAL_TABLET | Freq: Three times a day (TID) | ORAL | 1 refills | Status: DC | PRN

## 2017-10-23 NOTE — Patient Instructions (Addendum)
  Take the tylenol 2 pills every 6 hours to help with pain  Add the muscle relaxer - if it does not make you sleepy take 3x/day if it makes you sleepy just take at night  I will let your know your xray results through mychart later tonight   IF you received an x-ray today, you will receive an invoice from [facility]. Please contact [facility] at [phone] with questions or concerns regarding your invoice.   IF you received labwork today, you will receive an invoice from [facility]. Please contact [facility] at [phone] with questions or concerns regarding your invoice.   Our billing staff will not be able to assist you with questions regarding bills from these companies.  You will be contacted with the lab results as soon as they are available. The fastest way to get your results is to activate your My Chart account. Instructions are located on the last page of this paperwork. If you have not heard from us regarding the results in 2 weeks, please contact this office.

## 2017-10-23 NOTE — Progress Notes (Signed)
Gregory Singh  MRN: [id number] DOB: 23-Aug-1960  PCP: Patient, No Pcp Per  Chief Complaint  Patient presents with  . Arm Pain    left shoulder, elbow radiates down arm and pointer/index finger feels numb since Friday     Subjective:  Pt presents to clinic for left elbow pain and numbness in left 2/3rd digit of hand for the last 2 days.  At work 5 days ago he hit his elbow but he did not have this type of pain.  The pain over the last 2 days has continued to get worse.  He had something similar to this 5 years ago and was given medication that made it go away.  He took tylenol this am which helped a little.   History is obtained by patient.  Review of Systems  Musculoskeletal: Negative for neck pain.    Patient Active Problem List   Diagnosis Date Noted  . Hepatitis B 01/19/2015  . Thrombocytopenia (HCC) 01/10/2015  . Tremor of both hands 01/10/2015  . High triglycerides 01/10/2015  . Neck pain 05/21/2011  . Shoulder pain 05/21/2011  . Neck pain on right side 05/15/2011    Current Outpatient Medications on File Prior to Visit  Medication Sig Dispense Refill  . clobetasol ointment (TEMOVATE) 0.05 % Apply bid to rash on leg liberally, do not use on face or genitals 60 g 1  . metoprolol succinate (TOPROL-XL) 50 MG 24 hr tablet Take 1 tablet (50 mg total) by mouth daily. Take with or immediately following a meal. 90 tablet 3  . omega-3 acid ethyl esters (LOVAZA) 1 g capsule Take 2 capsules (2 g total) by mouth daily. 30 capsule 2  . tenofovir (VIREAD) 300 MG tablet Take 1 tablet (300 mg total) by mouth daily. 30 tablet 6  . triamcinolone ointment (KENALOG) 0.5 % Apply 1 application topically 2 (two) times daily. 30 g 0   No current facility-administered medications on file prior to visit.     Allergies  Allergen Reactions  . Ibuprofen Itching    Past Medical History:  Diagnosis Date  . Hypertension    Social History   Social History Narrative  . Not on file   Social  History   Tobacco Use  . Smoking status: Current Every Day Smoker    Packs/day: 0.50    Types: Cigarettes  . Smokeless tobacco: Never Used  . Tobacco comment: cutting back  Substance Use Topics  . Alcohol use: Yes    Alcohol/week: 14.4 oz    Types: 24 Standard drinks or equivalent per week    Comment: beer 2 daily  . Drug use: No   family history includes Diabetes in his father; Stroke in his father.     Objective:  BP 120/80   Pulse 73   Temp 98.8 F (37.1 C)   Resp 16   Ht 5\' 6"  (1.676 m)   Wt 128 lb 12.8 oz (58.4 kg)   SpO2 97%   BMI 20.79 kg/m  Body mass index is 20.79 kg/m.  Wt Readings from Last 3 Encounters:  10/23/17 128 lb 12.8 oz (58.4 kg)  05/21/17 136 lb (61.7 kg)  12/11/16 130 lb 9.6 oz (59.2 kg)    Physical Exam  Constitutional: He is oriented to person, place, and time. He appears well-developed and well-nourished.  HENT:  Head: Normocephalic and atraumatic.  Right Ear: External ear normal.  Left Ear: External ear normal.  Eyes: Conjunctivae are normal.  Neck: Normal range of motion.  Pulmonary/Chest: Effort normal.  Musculoskeletal:       Right elbow: Normal.      Left elbow: He exhibits normal range of motion and no swelling. Tenderness found. No radial head, no medial epicondyle, no lateral epicondyle and no olecranon process tenderness noted.       Cervical back: He exhibits spasm (Left trapezius with muscle spasm that is tender to palpation). He exhibits normal range of motion and no tenderness.  No change in paresthesias with range of motion of neck testing.  Mild tenderness to palpation just proximal to lateral epicondyle but no tenderness over lateral epicondyle.  No swelling, no erythema.  Good strength.  No pain with wrist flexion and resistance testing.  No pain with wrist extension and resistance testing.  Monofilament testing equal bilateral fingers.  No pain with ulnar groove palpation.  Neurological: He is alert and oriented to person,  place, and time.  Skin: Skin is warm and dry.  Psychiatric: Judgment normal.  Vitals reviewed.   Dg Cervical Spine 2 Or 3 Views  Result Date: 10/23/2017 CLINICAL DATA:  Left hand numbness. EXAM: CERVICAL SPINE - 2-3 VIEW COMPARISON:  05/21/2011. FINDINGS: Moderate anterior spur formation at the C5-6 and C6-7 levels. Mild anterior spur formation at the C4-5 level. Mild facet degenerative changes at multiple levels. IMPRESSION: Mildly progressive multilevel degenerative changes. Electronically Signed   By: Beckie SaltsSteven  Reid M.D.   On: 10/23/2017 18:06    Assessment and Plan :  Numbness and tingling in left hand - Plan: DG Cervical Spine 2 or 3 views, cyclobenzaprine (FLEXERIL) 5 MG tablet  Trapezius muscle spasm - Plan: cyclobenzaprine (FLEXERIL) 5 MG tablet  Patient had something similar in 2013, repeated x-ray which shows degenerative changes that have progressed mildly.  Suspect that his paresthesias in fingers and pain in elbow may have to do with muscle spasms of the left trapezius.  We will treat this accordingly with muscle relaxers and he will use Tylenol at home.  If this does not improve patient's symptoms consider prednisone again.  Patient verbalized to me that they understand the following: diagnosis, what is being done for them, what to expect and what should be done at home.  Their questions have been answered.  See after visit summary for patient specific instructions.  Benny LennertSarah Lynde Ludwig PA-C  Primary Care at [facility] 10/23/2017 6:29 PM  Please note: Portions of this report may have been transcribed using dragon voice recognition software. Every effort was made to ensure accuracy; however, inadvertent computerized transcription errors may be present.

## 2017-11-01 ENCOUNTER — Telehealth: Payer: Self-pay | Admitting: Physician Assistant

## 2017-11-01 DIAGNOSIS — M62838 Other muscle spasm: Secondary | ICD-10-CM

## 2017-11-01 DIAGNOSIS — R200 Anesthesia of skin: Secondary | ICD-10-CM

## 2017-11-01 DIAGNOSIS — R202 Paresthesia of skin: Principal | ICD-10-CM

## 2017-11-01 MED ORDER — CYCLOBENZAPRINE HCL 5 MG PO TABS: 5.0000 mg | ORAL_TABLET | Freq: Three times a day (TID) | ORAL | 1 refills | Status: DC | PRN

## 2017-11-01 NOTE — Telephone Encounter (Signed)
I have sent in a refill if the medication helps- if he does not think it helps he should be seen again

## 2017-11-01 NOTE — Telephone Encounter (Signed)
Please advise 

## 2017-11-01 NOTE — Telephone Encounter (Signed)
Patient called and advised medication was sent and if he doesn't feel any better to make an appointment to be seen in the office, patient verbalized understanding.

## 2017-11-01 NOTE — Telephone Encounter (Signed)
Copied from CRM [id number]. Topic: Quick Communication - Rx Refill/Question >> Nov 01, 2017 10:08 AM Alexander BergeronBarksdale, Harvey B wrote: Medication: cyclobenzaprine (FLEXERIL) 5 MG tablet [[id number]][[id number]]   Pt called to state that he still has arm pain; pt wants to know if he can get a refill of medication above or if he needs another OV contact pt to advise

## 2017-11-15 ENCOUNTER — Encounter: Payer: Self-pay | Admitting: Physician Assistant

## 2017-11-15 ENCOUNTER — Ambulatory Visit (INDEPENDENT_AMBULATORY_CARE_PROVIDER_SITE_OTHER): Payer: BLUE CROSS/BLUE SHIELD | Admitting: Physician Assistant

## 2017-11-15 VITALS — BP 150/90 | HR 66 | Temp 98.2°F | Resp 16 | Ht 64.0 in | Wt 131.8 lb

## 2017-11-15 DIAGNOSIS — M25512 Pain in left shoulder: Secondary | ICD-10-CM | POA: Diagnosis not present

## 2017-11-15 MED ORDER — PREDNISONE 20 MG PO TABS: ORAL_TABLET | ORAL | 0 refills | Status: DC

## 2017-11-15 NOTE — Patient Instructions (Addendum)
Start taking prednisone: Take 1 tablet 2x/day (in the morning and at night) for 3 days; take 1/2 tablet 2x/day (in the morning and at night) for three days; then take 1/2 tablet once daily (in the morning) for 4 days   Please call me and let me know if you are not getting better. I will refer you to physical therapy.    Prednisone tablets What is this medicine? PREDNISONE (PRED ni sone) is a corticosteroid. It is commonly used to treat inflammation of the skin, joints, lungs, and other organs. Common conditions treated include asthma, allergies, and arthritis. It is also used for other conditions, such as blood disorders and diseases of the adrenal glands. This medicine may be used for other purposes; ask your health care provider or pharmacist if you have questions. COMMON BRAND NAME(S): Deltasone, Predone, Sterapred, Sterapred DS What should I tell my health care provider before I take this medicine? They need to know if you have any of these conditions: -Cushing's syndrome -diabetes -glaucoma -heart disease -high blood pressure -infection (especially a virus infection such as chickenpox, cold sores, or herpes) -kidney disease -liver disease -mental illness -myasthenia gravis -osteoporosis -seizures -stomach or intestine problems -thyroid disease -an unusual or allergic reaction to lactose, prednisone, other medicines, foods, dyes, or preservatives -pregnant or trying to get pregnant -breast-feeding How should I use this medicine? Take this medicine by mouth with a glass of water. Follow the directions on the prescription label. Take this medicine with food. If you are taking this medicine once a day, take it in the morning. Do not take more medicine than you are told to take. Do not suddenly stop taking your medicine because you may develop a severe reaction. Your doctor will tell you how much medicine to take. If your doctor wants you to stop the medicine, the dose may be  slowly lowered over time to avoid any side effects. Talk to your pediatrician regarding the use of this medicine in children. Special care may be needed. Overdosage: If you think you have taken too much of this medicine contact a poison control center or emergency room at once. NOTE: This medicine is only for you. Do not share this medicine with others. What if I miss a dose? If you miss a dose, take it as soon as you can. If it is almost time for your next dose, talk to your doctor or health care professional. You may need to miss a dose or take an extra dose. Do not take double or extra doses without advice. What may interact with this medicine? Do not take this medicine with any of the following medications: -metyrapone -mifepristone This medicine may also interact with the following medications: -aminoglutethimide -amphotericin B -aspirin and aspirin-like medicines -barbiturates -certain medicines for diabetes, like glipizide or glyburide -cholestyramine -cholinesterase inhibitors -cyclosporine -digoxin -diuretics -ephedrine -male hormones, like estrogens and birth control pills -isoniazid -ketoconazole -NSAIDS, medicines for pain and inflammation, like ibuprofen or naproxen -phenytoin -rifampin -toxoids -vaccines -warfarin This list may not describe all possible interactions. Give your health care provider a list of all the medicines, herbs, non-prescription drugs, or dietary supplements you use. Also tell them if you smoke, drink alcohol, or use illegal drugs. Some items may interact with your medicine. What should I watch for while using this medicine? Visit your doctor or health care professional for regular checks on your progress. If you are taking this medicine over a prolonged period, carry an identification card with your name and  address, the type and dose of your medicine, and your doctor's name and address. This medicine may increase your risk of getting an  infection. Tell your doctor or health care professional if you are around anyone with measles or chickenpox, or if you develop sores or blisters that do not heal properly. If you are going to have surgery, tell your doctor or health care professional that you have taken this medicine within the last twelve months. Ask your doctor or health care professional about your diet. You may need to lower the amount of salt you eat. This medicine may affect blood sugar levels. If you have diabetes, check with your doctor or health care professional before you change your diet or the dose of your diabetic medicine. What side effects may I notice from receiving this medicine? Side effects that you should report to your doctor or health care professional as soon as possible: -allergic reactions like skin rash, itching or hives, swelling of the face, lips, or tongue -changes in emotions or moods -changes in vision -depressed mood -eye pain -fever or chills, cough, sore throat, pain or difficulty passing urine -increased thirst -swelling of ankles, feet Side effects that usually do not require medical attention (report to your doctor or health care professional if they continue or are bothersome): -confusion, excitement, restlessness -headache -nausea, vomiting -skin problems, acne, thin and shiny skin -trouble sleeping -weight gain This list may not describe all possible side effects. Call your doctor for medical advice about side effects. You may report side effects to FDA at 1-800-FDA-1088. Where should I keep my medicine? Keep out of the reach of children. Store at room temperature between 15 and 30 degrees C (59 and 86 degrees F). Protect from light. Keep container tightly closed. Throw away any unused medicine after the expiration date. NOTE: This sheet is a summary. It may not cover all possible information. If you have questions about this medicine, talk to your doctor, pharmacist, or health care  provider.  2018 Elsevier/Gold Standard (2010-10-19 10:57:14)  Thank you for coming in today. I hope you feel we met your needs.  Feel free to call PCP if you have any questions or further requests.  Please consider signing up for MyChart if you do not already have it, as this is a great way to communicate with me.  Best,  Whitney McVey, PA-C   IF you received an x-ray today, you will receive an invoice from [facility]. Please contact [facility] at [phone] with questions or concerns regarding your invoice.   IF you received labwork today, you will receive an invoice from [place]. Please contact [facility] at [phone] with questions or concerns regarding your invoice.   Our billing staff will not be able to assist you with questions regarding bills from these companies.  You will be contacted with the lab results as soon as they are available. The fastest way to get your results is to activate your My Chart account. Instructions are located on the last page of this paperwork. If you have not heard from [place] regarding the results in 2 weeks, please contact this office.

## 2017-11-15 NOTE — Progress Notes (Signed)
Gregory HawkMike Singh  MRN: [id number] DOB: 02/24/1961  PCP: Patient, No Pcp Per  Subjective:  Pt is a 57 year old male who presents to clinic for f/u shoulder pain. He was here for this problem 8/7 and saw Benny LennertSarah Weber. X-ray showed degenerative changes that have progressed mildly. Rx for Cyclobezaprine 5 mg - doesn't help.  Endorses shoulder painLeft posterior shoulder down to elbow. Endorses occasional numbness of fingers. He had this problem several years ago and was treated with prednisone - this helped.  He has not been to PT for this problem. Denies chest pain.   \Review of Systems  Cardiovascular: Negative for chest pain and palpitations.  Musculoskeletal: Positive for arthralgias (L shoulder) and back pain (upper left). Negative for neck pain and neck stiffness.  Skin: Negative.   Neurological: Negative for weakness and numbness.    Patient Active Problem List   Diagnosis Date Noted  . Hepatitis B 01/19/2015  . Thrombocytopenia (HCC) 01/10/2015  . Tremor of both hands 01/10/2015  . High triglycerides 01/10/2015  . Neck pain 05/21/2011  . Shoulder pain 05/21/2011  . Neck pain on right side 05/15/2011    Current Outpatient Medications on File Prior to Visit  Medication Sig Dispense Refill  . clobetasol ointment (TEMOVATE) 0.05 % Apply bid to rash on leg liberally, do not use on face or genitals 60 g 1  . metoprolol succinate (TOPROL-XL) 50 MG 24 hr tablet Take 1 tablet (50 mg total) by mouth daily. Take with or immediately following a meal. 90 tablet 3  . omega-3 acid ethyl esters (LOVAZA) 1 g capsule Take 2 capsules (2 g total) by mouth daily. 30 capsule 2  . tenofovir (VIREAD) 300 MG tablet Take 1 tablet (300 mg total) by mouth daily. 30 tablet 6  . triamcinolone ointment (KENALOG) 0.5 % Apply 1 application topically 2 (two) times daily. 30 g 0  . cyclobenzaprine (FLEXERIL) 5 MG tablet Take 1 tablet (5 mg total) by mouth 3 (three) times daily as needed for muscle spasms. (Patient  not taking: Reported on 11/15/2017) 30 tablet 1   No current facility-administered medications on file prior to visit.     Allergies  Allergen Reactions  . Ibuprofen Itching     Objective:  BP (!) 150/90 (BP Location: Left Arm, Patient Position: Sitting, Cuff Size: Normal)   Pulse 66   Temp 98.2 F (36.8 C) (Oral)   Resp 16   Ht 5\' 4"  (1.626 m)   Wt 131 lb 12.8 oz (59.8 kg)   SpO2 98%   BMI 22.62 kg/m   Physical Exam  Constitutional: He is oriented to person, place, and time. He appears well-developed and well-nourished.  Musculoskeletal:       Left shoulder: He exhibits tenderness (muscular). He exhibits normal range of motion, no bony tenderness, no crepitus, no deformity and normal strength.  Neurological: He is alert and oriented to person, place, and time.  Skin: Skin is warm and dry.  Psychiatric: He has a normal mood and affect. His behavior is normal. Judgment and thought content normal.  Vitals reviewed.  Assessment and Plan :  1. Pain in joint of left shoulder - Pt was here for this problem 8/7 and saw Benny LennertSarah Weber. X-ray showed degenerative changes that have progressed mildly. Rx for Cyclobezaprine 5 mg - doesn't help. Plan to try prednisone. Will consider PT if no improvement.  - predniSONE (DELTASONE) 20 MG tablet; Take 1 tab 2x/day x3d, 1/2 tab 2x/day x3d, 1/2 tab daily  x4d for neck inflammation  Dispense: 11 tablet; Refill: 0   Marco Collie, PA-C  Primary Care at [facility] 11/15/2017 6:21 PM  Please note: Portions of this report may have been transcribed using dragon voice recognition software. Every effort was made to ensure accuracy; however, inadvertent computerized transcription errors may be present.

## 2017-11-25 ENCOUNTER — Encounter: Payer: Self-pay | Admitting: Internal Medicine

## 2017-11-25 ENCOUNTER — Ambulatory Visit (INDEPENDENT_AMBULATORY_CARE_PROVIDER_SITE_OTHER): Payer: BLUE CROSS/BLUE SHIELD | Admitting: Internal Medicine

## 2017-11-25 VITALS — BP 156/85 | HR 69 | Temp 98.7°F

## 2017-11-25 DIAGNOSIS — I10 Essential (primary) hypertension: Secondary | ICD-10-CM

## 2017-11-25 DIAGNOSIS — Z7289 Other problems related to lifestyle: Secondary | ICD-10-CM

## 2017-11-25 DIAGNOSIS — Z789 Other specified health status: Secondary | ICD-10-CM | POA: Diagnosis not present

## 2017-11-25 DIAGNOSIS — B181 Chronic viral hepatitis B without delta-agent: Secondary | ICD-10-CM

## 2017-11-25 LAB — URINALYSIS
Bilirubin Urine: NEGATIVE
Glucose, UA: NEGATIVE
HGB URINE DIPSTICK: NEGATIVE
KETONES UR: NEGATIVE
Leukocytes, UA: NEGATIVE
NITRITE: NEGATIVE
PROTEIN: NEGATIVE
SPECIFIC GRAVITY, URINE: 1.018 (ref 1.001–1.03)
pH: 6 (ref 5.0–8.0)

## 2017-11-25 NOTE — Progress Notes (Signed)
[name]: follow up for hiv disease  Patient ID: Gregory Singh, male   DOB: 1961-03-04, 57 y.o.   MRN: [id number]  HPI Gregory Singh is a 57yo [place] male with history of chronic hepatitis B currently on tdf, last seen 6 months ago.he was previously on TAF however his co-pay was cost-prohibitive for him. He is currently switched back to TDF, as of mid may 2019. He recently was treated for tendinitis of shoulder by his pcp, he just finished 10d course of prednisone.  Soc hx: smokes 1 pack over 3 days. He also continues to drink 2-3 beers of bud light/daily.  Outpatient Encounter Medications as of 11/25/2017  Medication Sig  . clobetasol ointment (TEMOVATE) 0.05 % Apply bid to rash on leg liberally, do not use on face or genitals  . metoprolol succinate (TOPROL-XL) 50 MG 24 hr tablet Take 1 tablet (50 mg total) by mouth daily. Take with or immediately following a meal.  . omega-3 acid ethyl esters (LOVAZA) 1 g capsule Take 2 capsules (2 g total) by mouth daily.  . predniSONE (DELTASONE) 20 MG tablet Take 1 tab 2x/day x3d, 1/2 tab 2x/day x3d, 1/2 tab daily x4d for neck inflammation  . tenofovir (VIREAD) 300 MG tablet Take 1 tablet (300 mg total) by mouth daily.  Marland Kitchen triamcinolone ointment (KENALOG) 0.5 % Apply 1 application topically 2 (two) times daily.   No facility-administered encounter medications on file as of 11/25/2017.      Patient Active Problem List   Diagnosis Date Noted  . Hepatitis B 01/19/2015  . Thrombocytopenia (HCC) 01/10/2015  . Tremor of both hands 01/10/2015  . High triglycerides 01/10/2015  . Neck pain 05/21/2011  . Shoulder pain 05/21/2011  . Neck pain on right side 05/15/2011     Health Maintenance Due  Topic Date Due  . COLONOSCOPY  08/25/2012  . INFLUENZA VACCINE  10/17/2017     Review of Systems Review of Systems  Constitutional: Negative for fever, chills, diaphoresis, activity change, appetite change, fatigue and unexpected weight change.  HENT: Negative for  congestion, sore throat, rhinorrhea, sneezing, trouble swallowing and sinus pressure.  Eyes: Negative for photophobia and visual disturbance.  Respiratory: Negative for cough, chest tightness, shortness of breath, wheezing and stridor.  Cardiovascular: Negative for chest pain, palpitations and leg swelling.  Gastrointestinal: Negative for nausea, vomiting, abdominal pain, diarrhea, constipation, blood in stool, abdominal distention and anal bleeding.  Genitourinary: Negative for dysuria, hematuria, flank pain and difficulty urinating.  Musculoskeletal: left shoulder pain, with mild numbness to thumb nad 1st digit. Negative for myalgias, back pain, joint swelling, arthralgias and gait problem.  Skin: Negative for color change, pallor, rash and wound.  Neurological: Negative for dizziness, tremors, weakness and light-headedness.  Hematological: Negative for adenopathy. Does not bruise/bleed easily.  Psychiatric/Behavioral: Negative for behavioral problems, confusion, sleep disturbance, dysphoric mood, decreased concentration and agitation.    Physical Exam  BP (!) 156/85   Pulse 69   Temp 98.7 F (37.1 C) (Oral)  Physical Exam  Constitutional: He is oriented to person, place, and time. He appears well-developed and well-nourished. No distress.  HENT:  Mouth/Throat: Oropharynx is clear and moist. No oropharyngeal exudate.  Abdominal: Soft. Bowel sounds are normal. He exhibits no distension. There is no tenderness.  Neurological: He is alert and oriented to person, place, and time.  Skin: Skin is warm and dry. No rash noted. No erythema.  Psychiatric: He has a normal mood and affect. His behavior is normal.    Lab Results  Component Value Date   HEPBSAB NON-REACTIVE 05/21/2017    CBC Lab Results  Component Value Date   WBC 5.2 12/11/2016   RBC 4.44 12/11/2016   HGB 15.6 12/11/2016   HCT 45.3 12/11/2016   PLT 116 (L) 12/11/2016   MCV 102 (H) 12/11/2016   MCH 35.1 (H) 12/11/2016     MCHC 34.4 12/11/2016   RDW 13.3 12/11/2016   LYMPHSABS 1,908 04/24/2016   MONOABS 477 04/24/2016   EOSABS 106 04/24/2016    BMET Lab Results  Component Value Date   NA 142 05/21/2017   K 3.7 05/21/2017   CL 108 05/21/2017   CO2 26 05/21/2017   GLUCOSE 114 (H) 05/21/2017   BUN 9 05/21/2017   CREATININE 0.83 05/21/2017   CALCIUM 9.2 05/21/2017   GFRNONAA 100 05/21/2017   GFRAA 116 05/21/2017      Assessment and Plan  Chronic hepatitis b = will check labs and U/S  Chronic medication management = will check cr and ua to see that kidney function is stable  Alcohol and tobacco use = discussed cutting back  htn = not at goal during this visit.currently on beta blocker; asked for him to follow up with pcp. May benefit from thiazide

## 2017-11-26 LAB — CBC WITH DIFFERENTIAL/PLATELET
BASOS ABS: 76 {cells}/uL (ref 0–200)
Basophils Relative: 1 %
EOS PCT: 0.7 %
Eosinophils Absolute: 53 cells/uL (ref 15–500)
HCT: 50.3 % — ABNORMAL HIGH (ref 38.5–50.0)
HEMOGLOBIN: 17.7 g/dL — AB (ref 13.2–17.1)
Lymphs Abs: 1794 cells/uL (ref 850–3900)
MCH: 35.4 pg — ABNORMAL HIGH (ref 27.0–33.0)
MCHC: 35.2 g/dL (ref 32.0–36.0)
MCV: 100.6 fL — ABNORMAL HIGH (ref 80.0–100.0)
MONOS PCT: 5.9 %
MPV: 11.5 fL (ref 7.5–12.5)
NEUTROS ABS: 5229 {cells}/uL (ref 1500–7800)
Neutrophils Relative %: 68.8 %
Platelets: 101 10*3/uL — ABNORMAL LOW (ref 140–400)
RBC: 5 10*6/uL (ref 4.20–5.80)
RDW: 12 % (ref 11.0–15.0)
Total Lymphocyte: 23.6 %
WBC mixed population: 448 cells/uL (ref 200–950)
WBC: 7.6 10*3/uL (ref 3.8–10.8)

## 2017-11-26 LAB — COMPLETE METABOLIC PANEL WITH GFR
AG Ratio: 1.3 (calc) (ref 1.0–2.5)
ALKALINE PHOSPHATASE (APISO): 144 U/L — AB (ref 40–115)
ALT: 99 U/L — ABNORMAL HIGH (ref 9–46)
AST: 100 U/L — AB (ref 10–35)
Albumin: 4.2 g/dL (ref 3.6–5.1)
BILIRUBIN TOTAL: 0.8 mg/dL (ref 0.2–1.2)
BUN: 13 mg/dL (ref 7–25)
CHLORIDE: 102 mmol/L (ref 98–110)
CO2: 28 mmol/L (ref 20–32)
Calcium: 9.8 mg/dL (ref 8.6–10.3)
Creat: 0.91 mg/dL (ref 0.70–1.33)
GFR, EST AFRICAN AMERICAN: 110 mL/min/{1.73_m2} (ref >=60)
GFR, Est Non African American: 95 mL/min/{1.73_m2} (ref >=60)
Globulin: 3.2 g/dL (calc) (ref 1.9–3.7)
Glucose, Bld: 89 mg/dL (ref 65–99)
POTASSIUM: 4.7 mmol/L (ref 3.5–5.3)
Sodium: 140 mmol/L (ref 135–146)
Total Protein: 7.4 g/dL (ref 6.1–8.1)

## 2017-11-26 LAB — AFP TUMOR MARKER: AFP-Tumor Marker: 7.9 ng/mL — ABNORMAL HIGH (ref <6.1)

## 2017-11-26 LAB — PROTIME-INR
INR: 1.1
PROTHROMBIN TIME: 11.8 s — AB (ref 9.0–11.5)

## 2017-11-27 ENCOUNTER — Ambulatory Visit (HOSPITAL_COMMUNITY)
Admission: RE | Admit: 2017-11-27 | Discharge: 2017-11-27 | Disposition: A | Discharge status: Home / Self Care | Payer: BLUE CROSS/BLUE SHIELD | Source: Ambulatory Visit | Attending: Internal Medicine | Admitting: Internal Medicine

## 2017-11-27 DIAGNOSIS — K760 Fatty (change of) liver, not elsewhere classified: Secondary | ICD-10-CM | POA: Insufficient documentation

## 2017-11-27 DIAGNOSIS — B181 Chronic viral hepatitis B without delta-agent: Secondary | ICD-10-CM | POA: Diagnosis not present

## 2017-11-27 DIAGNOSIS — K824 Cholesterolosis of gallbladder: Secondary | ICD-10-CM | POA: Insufficient documentation

## 2018-01-06 ENCOUNTER — Other Ambulatory Visit: Payer: Self-pay | Admitting: Physician Assistant

## 2018-01-06 DIAGNOSIS — I10 Essential (primary) hypertension: Secondary | ICD-10-CM

## 2018-01-06 DIAGNOSIS — R251 Tremor, unspecified: Secondary | ICD-10-CM

## 2018-01-06 DIAGNOSIS — F17200 Nicotine dependence, unspecified, uncomplicated: Secondary | ICD-10-CM

## 2018-01-06 NOTE — Telephone Encounter (Signed)
Copied from CRM [id number]. Topic: Quick Communication - Rx Refill/Question >> Jan 06, 2018 11:23 AM Lenoria Chime wrote: Medication: metoprolol succinate (TOPROL-XL) 50 MG 24 hr tablet  Has the patient contacted their pharmacy? Yes.   (Agent: If no, request that the patient contact the pharmacy for the refill.) (Agent: If yes, when and what did the pharmacy advise?)  Preferred Pharmacy (with phone number or street name): [id number] - [place],  - [address] AT [facility] OF [address]  Agent: Please be advised that RX refills may take up to 3 business days. We ask that you follow-up with your pharmacy.   Pt only has one pill left

## 2018-01-07 MED ORDER — METOPROLOL SUCCINATE ER 50 MG PO TB24: 50.0000 mg | ORAL_TABLET | Freq: Every day | ORAL | 0 refills | Status: DC

## 2018-01-07 NOTE — Telephone Encounter (Signed)
Pt's last office visit 12/11/16; no upcoming visits noted; contacted pt; he schedules appointment with Whitney McVey 02/06/18 at 0800; the pt also verbalizes understanding.

## 2018-01-10 ENCOUNTER — Telehealth: Payer: Self-pay | Admitting: Physician Assistant

## 2018-01-10 NOTE — Telephone Encounter (Signed)
Celled pt and rescheduled appt for med fills for Thursday 01/23/2018 .advised pt of bldg 102 and late policy

## 2018-01-23 ENCOUNTER — Ambulatory Visit (INDEPENDENT_AMBULATORY_CARE_PROVIDER_SITE_OTHER): Payer: BLUE CROSS/BLUE SHIELD | Admitting: Physician Assistant

## 2018-01-23 ENCOUNTER — Encounter: Payer: Self-pay | Admitting: Physician Assistant

## 2018-01-23 ENCOUNTER — Other Ambulatory Visit: Payer: Self-pay

## 2018-01-23 VITALS — BP 149/85 | HR 70 | Temp 98.1°F | Ht 64.0 in | Wt 127.2 lb

## 2018-01-23 DIAGNOSIS — E785 Hyperlipidemia, unspecified: Secondary | ICD-10-CM

## 2018-01-23 DIAGNOSIS — Z13228 Encounter for screening for other metabolic disorders: Secondary | ICD-10-CM | POA: Diagnosis not present

## 2018-01-23 DIAGNOSIS — I10 Essential (primary) hypertension: Secondary | ICD-10-CM | POA: Diagnosis not present

## 2018-01-23 DIAGNOSIS — Z130 Encounter for screening for diseases of the blood and blood-forming organs and certain disorders involving the immune mechanism: Secondary | ICD-10-CM

## 2018-01-23 DIAGNOSIS — Z1329 Encounter for screening for other suspected endocrine disorder: Secondary | ICD-10-CM

## 2018-01-23 MED ORDER — METOPROLOL-HCTZ ER 50-12.5 MG PO TB24: 1.0000 | ORAL_TABLET | Freq: Every day | ORAL | 3 refills | Status: DC

## 2018-01-23 NOTE — Patient Instructions (Addendum)
Stop taking metoprolol. Start taking Metoprolol-Hydrochlorothiazide 50-12.5 MG for your blood pressure.  Come back and see me in 2 weeks for blood pressure check..   Continue taking fish oil supplements  Start taking flaxseed oil or seeds daily. Make sure the seeds are ground (crushed).  Daily consumption of almonds (42 g/day or about half a cup or 1.5 ounces), walnuts, pistachios alone or in combination with dark chocolate had favorable effects on total cholesterol  Fiber-rich foods, such as fruits, vegetables, beans, and oats, seem to lower cholesterol and are generally good for your health.    A Mediterranean diet appears to reduce the risk of cardiovascular events. There is no single Mediterranean diet, but such diets are typically high in fruits, vegetables, whole grains, beans, nuts, and seeds and include olive oil as an important source of fat; there are typically low to moderate amounts of fish, poultry, and dairy products, and there is little red meat.   If you have lab work done today you will be contacted with your lab results within the next 2 weeks.  If you have not heard from [place] then please contact us. The fastest way to get your results is to register for My Chart.  Thank you for coming in today. I hope you feel we met your needs.  Feel free to call PCP if you have any questions or further requests.  Please consider signing up for MyChart if you do not already have it, as this is a great way to communicate with me.  Best,  Whitney McVey, PA-C  IF you received an x-ray today, you will receive an invoice from [facility]. Please contact [facility] at [phone] with questions or concerns regarding your invoice.   IF you received labwork today, you will receive an invoice from [facility]. Please contact [facility] at [phone] with questions or concerns regarding your invoice.   Our billing staff will not be able to assist you with questions regarding bills  from these companies.  You will be contacted with the lab results as soon as they are available. The fastest way to get your results is to activate your My Chart account. Instructions are located on the last page of this paperwork. If you have not heard from [place] regarding the results in 2 weeks, please contact this office.

## 2018-01-23 NOTE — Progress Notes (Signed)
Gregory Singh  MRN: [id number] DOB: 1960/12/10  PCP: Dorise Hiss, PA-C  Subjective:  Pt is a 57 year old male PMH HTN, HLD, history of chronic hepatitis B currently on tdf who presents to clinic for medication refill of metoprolol.   HTN - uncontrolled. Taking Metoprolol 89m qd. Blood pressure today is 149/85. Last 3 blood pressure readings have been high. He does not check home blood pressures. Denies lightheadedness, dizziness, chronic headache, double vision, chest pain, shortness of breath, heart racing, palpitations, nausea, vomiting, abdominal pain, hematuria, lower leg swelling.  HLD - Last lipids done at annual exam. Advised to start [place] He did not come back for recheck. He is not taking this medication. He does take fish oil twice daily.  He cut out fried chicken, beef, eggs. He eats fish, chicken, rice  Lab Results  Component Value Date   CHOL 156 12/11/2016   CHOL 132 10/27/2015   CHOL 171 03/07/2015   Lab Results  Component Value Date   HDL 36 (L) 12/11/2016   HDL 40 10/27/2015   HDL 29 (L) 03/07/2015   Lab Results  Component Value Date   LDLCALC Comment 12/11/2016   LDLCALC 34 10/27/2015   LDLCALC NOT CALC 03/07/2015   Lab Results  Component Value Date   TRIG 428 (H) 12/11/2016   TRIG 290 (H) 10/27/2015   TRIG 646 (H) 03/07/2015   Lab Results  Component Value Date   CHOLHDL 4.3 12/11/2016   CHOLHDL 3.3 10/27/2015   CHOLHDL 5.9 (H) 03/07/2015   No results found for: LDLDIRECT  Review of Systems  Constitutional: Negative for chills and diaphoresis.  Respiratory: Negative for cough, chest tightness, shortness of breath and wheezing.   Cardiovascular: Negative for chest pain, palpitations and leg swelling.  Gastrointestinal: Negative for diarrhea, nausea and vomiting.  Musculoskeletal: Negative for neck pain.  Neurological: Negative for dizziness, syncope, light-headedness and headaches.  Psychiatric/Behavioral: Negative for sleep  disturbance. The patient is not nervous/anxious.     Patient Active Problem List   Diagnosis Date Noted  . Hepatitis B 01/19/2015  . Thrombocytopenia ([place] 01/10/2015  . Tremor of both hands 01/10/2015  . High triglycerides 01/10/2015  . Neck pain 05/21/2011  . Shoulder pain 05/21/2011  . Neck pain on right side 05/15/2011    Current Outpatient Medications on File Prior to Visit  Medication Sig Dispense Refill  . clobetasol ointment (TEMOVATE) 0.05 % Apply bid to rash on leg liberally, do not use on face or genitals 60 g 1  . metoprolol succinate (TOPROL-XL) 50 MG 24 hr tablet Take 1 tablet (50 mg total) by mouth daily. Take with or immediately following a meal. 30 tablet 0  . omega-3 acid ethyl esters (LOVAZA) 1 g capsule Take 2 capsules (2 g total) by mouth daily. 30 capsule 2  . predniSONE (DELTASONE) 20 MG tablet Take 1 tab 2x/day x3d, 1/2 tab 2x/day x3d, 1/2 tab daily x4d for neck inflammation 11 tablet 0  . tenofovir (VIREAD) 300 MG tablet Take 1 tablet (300 mg total) by mouth daily. 30 tablet 6  . triamcinolone ointment (KENALOG) 0.5 % Apply 1 application topically 2 (two) times daily. 30 g 0   No current facility-administered medications on file prior to visit.     Allergies  Allergen Reactions  . Ibuprofen Itching     Objective:  BP (!) 149/85   Pulse 70   Temp 98.1 F (36.7 C) (Oral)   Ht '5\' 4"'  (1.626 m)   Wt  127 lb 3.2 oz (57.7 kg)   SpO2 96%   BMI 21.83 kg/m   Physical Exam  Constitutional: He appears well-developed and well-nourished. No distress.  Neck: Carotid bruit is not present.  Cardiovascular: Normal rate, regular rhythm, normal heart sounds and normal pulses.  Vitals reviewed.   Assessment and Plan :  1. Essential hypertension - Uncontrolled. Plan to add HCTZ. RTC in 2 weeks for recheck. Advised pt to start eating  Flaxseed, continue fish oil supplement.  - Metoprolol-Hydrochlorothiazide 50-12.5 MG TB24; Take 1 tablet by mouth daily.  Dispense:  90 tablet; Refill: 3  2. Hyperlipidemia, unspecified hyperlipidemia type - Lipid panel  3. Screening for endocrine, metabolic and immunity disorder - CMP14+EGFR   Mercer Pod, PA-C  [facility] 01/23/2018 5:48 PM  Please note: Portions of this report may have been transcribed using dragon voice recognition software. Every effort was made to ensure accuracy; however, inadvertent computerized transcription errors may be present.

## 2018-01-24 ENCOUNTER — Telehealth: Payer: Self-pay | Admitting: Physician Assistant

## 2018-01-24 LAB — CMP14+EGFR
ALT: 78 IU/L — ABNORMAL HIGH (ref 0–44)
AST: 95 IU/L — ABNORMAL HIGH (ref 0–40)
Albumin/Globulin Ratio: 1.2 (ref 1.2–2.2)
Albumin: 4.1 g/dL (ref 3.5–5.5)
Alkaline Phosphatase: 131 IU/L — ABNORMAL HIGH (ref 39–117)
BUN/Creatinine Ratio: 14 (ref 9–20)
BUN: 11 mg/dL (ref 6–24)
Bilirubin Total: 0.5 mg/dL (ref 0.0–1.2)
CO2: 21 mmol/L (ref 20–29)
Calcium: 9.6 mg/dL (ref 8.7–10.2)
Chloride: 104 mmol/L (ref 96–106)
Creatinine, Ser: 0.78 mg/dL (ref 0.76–1.27)
GFR calc Af Amer: 117 mL/min/{1.73_m2} (ref >59)
GFR calc non Af Amer: 102 mL/min/{1.73_m2} (ref >59)
Globulin, Total: 3.3 g/dL (ref 1.5–4.5)
Glucose: 142 mg/dL — ABNORMAL HIGH (ref 65–99)
Potassium: 3.7 mmol/L (ref 3.5–5.2)
Sodium: 139 mmol/L (ref 134–144)
Total Protein: 7.4 g/dL (ref 6.0–8.5)

## 2018-01-24 LAB — LIPID PANEL
Chol/HDL Ratio: 3.9 ratio (ref 0.0–5.0)
Cholesterol, Total: 138 mg/dL (ref 100–199)
HDL: 35 mg/dL — ABNORMAL LOW (ref >39)
LDL Calculated: 41 mg/dL (ref 0–99)
Triglycerides: 310 mg/dL — ABNORMAL HIGH (ref 0–149)
VLDL Cholesterol Cal: 62 mg/dL — ABNORMAL HIGH (ref 5–40)

## 2018-01-24 NOTE — Telephone Encounter (Signed)
Message sent to [facility] re: pharmacy call re: Metoprolol/HCTZ

## 2018-01-24 NOTE — Telephone Encounter (Signed)
Copied from CRM [id number]. Topic: Quick Communication - Rx Refill/Question >> Jan 24, 2018  9:28 AM Lynne Logan D wrote: Medication: Metoprolol-Hydrochlorothiazide 50-12.5 MG TB24 / Pharmacy states medication does not come in the strength prescribed, only 100 MG or 25 Mg. Please advise Cb# [phone]   Has the patient contacted their pharmacy? Yes.   (Agent: If no, request that the patient contact the pharmacy for the refill.) (Agent: If yes, when and what did the pharmacy advise?)  Preferred Pharmacy (with phone number or street name): [facility] #[id number] [place], [place] - [address] AT [facility] OF [phone] (Phone) [phone] (Fax)    Agent: Please be advised that RX refills may take up to 3 business days. We ask that you follow-up with your pharmacy.

## 2018-01-27 ENCOUNTER — Other Ambulatory Visit: Payer: Self-pay | Admitting: Physician Assistant

## 2018-01-27 DIAGNOSIS — I10 Essential (primary) hypertension: Secondary | ICD-10-CM

## 2018-01-27 NOTE — Telephone Encounter (Signed)
If it only comes in 100-25 mg TB, then the pt can cut it in half.

## 2018-01-28 NOTE — Telephone Encounter (Signed)
Pharmacy called and stated prescription for Metoprolol-Hydrochlorothiazide 50-12.5 MG TB24 needs to be changed due to medication not coming in prescribed form. Please reach out to pharmacy to clarify/addend so patient can start taking the medication.  [id number] [place]- [place], [place] - [address] AT [facility] OF [address] [phone] (Phone) [phone] (Fax)

## 2018-01-28 NOTE — Telephone Encounter (Signed)
Patient calling to check on the status of getting metoprolol-hctz corrected? He says he's supposed to take the medication and then see her on the 19th but he hasn't had the medication so the appointment may need to be delayed. Please call patient once resolved.

## 2018-01-29 ENCOUNTER — Other Ambulatory Visit: Payer: Self-pay | Admitting: Internal Medicine

## 2018-01-29 DIAGNOSIS — B181 Chronic viral hepatitis B without delta-agent: Secondary | ICD-10-CM

## 2018-02-03 ENCOUNTER — Other Ambulatory Visit: Payer: Self-pay | Admitting: *Deleted

## 2018-02-03 DIAGNOSIS — I10 Essential (primary) hypertension: Secondary | ICD-10-CM

## 2018-02-03 MED ORDER — METOPROLOL-HCTZ ER 50-12.5 MG PO TB24: 0.5000 | ORAL_TABLET | Freq: Every day | ORAL | 3 refills | Status: DC

## 2018-02-03 NOTE — Telephone Encounter (Signed)
Pt's note states that he has an appointment scheduled for the 19th, but he has not had medication.  He wants to know if he should keep this appointment. pls advise

## 2018-02-06 ENCOUNTER — Ambulatory Visit: Payer: Self-pay | Admitting: Physician Assistant

## 2018-05-26 ENCOUNTER — Encounter: Payer: Self-pay | Admitting: Internal Medicine

## 2018-05-26 ENCOUNTER — Ambulatory Visit (INDEPENDENT_AMBULATORY_CARE_PROVIDER_SITE_OTHER): Payer: BLUE CROSS/BLUE SHIELD | Admitting: Internal Medicine

## 2018-05-26 VITALS — BP 112/75 | HR 73 | Temp 98.4°F | Ht 66.0 in | Wt 125.0 lb

## 2018-05-26 DIAGNOSIS — B181 Chronic viral hepatitis B without delta-agent: Secondary | ICD-10-CM | POA: Diagnosis not present

## 2018-05-26 NOTE — Progress Notes (Signed)
[name]: follow up for chronic hepatitis B  Patient ID: Gregory Singh, male   DOB: Oct 21, 1960, 58 y.o.   MRN: [id number]  HPI  Gregory Singh is doing well from taking his tenofovir daily for chronic hep b treatment. 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 His wife accompanies him today.he reports that he is doing well overall.  Soc hx: 3 bud light per night/ 6 cigs per day  Outpatient Encounter Medications as of 05/26/2018  Medication Sig  . clobetasol ointment (TEMOVATE) 0.05 % Apply bid to rash on leg liberally, do not use on face or genitals  . Metoprolol-Hydrochlorothiazide 50-12.5 MG TB24 Take 0.5 tablets by mouth daily.  Gregory Singh Kitchen omega-3 acid ethyl esters (LOVAZA) 1 g capsule Take 2 capsules (2 g total) by mouth daily.  . predniSONE (DELTASONE) 20 MG tablet Take 1 tab 2x/day x3d, 1/2 tab 2x/day x3d, 1/2 tab daily x4d for neck inflammation  . tenofovir (VIREAD) 300 MG tablet TAKE ONE Tablet BY MOUTH EVERY DAY  . triamcinolone ointment (KENALOG) 0.5 % Apply 1 application topically 2 (two) times daily.   No facility-administered encounter medications on file as of 05/26/2018.      Patient Active Problem List   Diagnosis Date Noted  . Hepatitis B 01/19/2015  . Thrombocytopenia (HCC) 01/10/2015  . Tremor of both hands 01/10/2015  . High triglycerides 01/10/2015  . Neck pain 05/21/2011  . Shoulder pain 05/21/2011  . Neck pain on right side 05/15/2011     Health Maintenance Due  Topic Date Due  . COLONOSCOPY  08/25/2012  . INFLUENZA VACCINE  10/17/2017     Review of Systems Review of Systems  Constitutional: Negative for fever, chills, diaphoresis, activity change, appetite change, fatigue and unexpected weight change.  HENT: Negative for congestion, sore throat, rhinorrhea, sneezing, trouble swallowing and sinus pressure.  Eyes: Negative for photophobia and visual disturbance.  Respiratory: Negative for cough, chest tightness, shortness of breath, wheezing and stridor.    Cardiovascular: Negative for chest pain, palpitations and leg swelling.  Gastrointestinal: Negative for nausea, vomiting, abdominal pain, diarrhea, constipation, blood in stool, abdominal distention and anal bleeding.  Genitourinary: Negative for dysuria, hematuria, flank pain and difficulty urinating.  Musculoskeletal: Negative for myalgias, back pain, joint swelling, arthralgias and gait problem.  Skin: Negative for color change, pallor, rash and wound.  Neurological: Negative for dizziness, tremors, weakness and light-headedness.  Hematological: Negative for adenopathy. Does not bruise/bleed easily.  Psychiatric/Behavioral: Negative for behavioral problems, confusion, sleep disturbance, dysphoric mood, decreased concentration and agitation.    Physical Exam   BP 112/75   Pulse 73   Temp 98.4 F (36.9 C) (Oral)   Ht 5\' 6"  (1.676 m)   Wt 125 lb (56.7 kg)   BMI 20.18 kg/m   Physical Exam  Constitutional: He is oriented to person, place, and time. He appears well-developed and well-nourished. No distress.  HENT:  Mouth/Throat: Oropharynx is clear and moist. No oropharyngeal exudate.  Cardiovascular: Normal rate, regular rhythm and normal heart sounds. Exam reveals no gallop and no friction rub.  No murmur heard.  Pulmonary/Chest: Effort normal and breath sounds normal. No respiratory distress. He has no wheezes.  Abdominal: Soft. Bowel sounds are normal. He exhibits no distension. There is no tenderness.  Lymphadenopathy:  He has no cervical adenopathy.  Neurological: He is alert and oriented to person, place, and time.  Skin: Skin is warm and dry. No rash noted. No erythema.  Psychiatric: He has a normal mood and affect. His  behavior is normal.    Lab Results  Component Value Date   HEPBSAB NON-REACTIVE 05/21/2017   No results found for: RPR, LABRPR  CBC Lab Results  Component Value Date   WBC 7.6 11/25/2017   RBC 5.00 11/25/2017   HGB 17.7 (H) 11/25/2017   HCT 50.3  (H) 11/25/2017   PLT 101 (L) 11/25/2017   MCV 100.6 (H) 11/25/2017   MCH 35.4 (H) 11/25/2017   MCHC 35.2 11/25/2017   RDW 12.0 11/25/2017   LYMPHSABS 1,794 11/25/2017   MONOABS 477 04/24/2016   EOSABS 53 11/25/2017    BMET Lab Results  Component Value Date   NA 139 01/23/2018   K 3.7 01/23/2018   CL 104 01/23/2018   CO2 21 01/23/2018   GLUCOSE 142 (H) 01/23/2018   BUN 11 01/23/2018   CREATININE 0.78 01/23/2018   CALCIUM 9.6 01/23/2018   GFRNONAA 102 01/23/2018   GFRAA 117 01/23/2018      Assessment and Plan  Chronic hepatitis b = plan on continuing with tenofovir daily Will check labs now Repeat imaging at next visit  Long term medication management = will check cr to see that it is stable rtc in 6 mo

## 2018-05-28 LAB — HEPATITIS B SURFACE ANTIBODY,QUALITATIVE: HEP B S AB: NONREACTIVE

## 2018-05-28 LAB — HEPATITIS B E ANTIBODY: HEP B E AB: REACTIVE — AB

## 2018-05-28 LAB — HEPATITIS B DNA, ULTRAQUANTITATIVE, PCR
Hepatitis B DNA (Calc): <1 Log IU/mL
Hepatitis B DNA: <10 IU/mL

## 2018-06-26 ENCOUNTER — Other Ambulatory Visit: Payer: Self-pay | Admitting: Internal Medicine

## 2018-06-26 DIAGNOSIS — B181 Chronic viral hepatitis B without delta-agent: Secondary | ICD-10-CM

## 2018-10-29 ENCOUNTER — Telehealth: Payer: Self-pay | Admitting: Pharmacy Technician

## 2018-10-29 NOTE — Telephone Encounter (Signed)
Gregory Singh's pharmacy called this afternoon to inform us that the patient's [name] had expired on October 11, 2018. The funds for that disease state are currently closed through [employer], Patient Advocate and PANF. I informed the person on the phone how to access the website to see which disease state funds were currently open/closed. While on the phone I applied with [place] copay card and received notification that the patient was ineligible. [phone] customer service for [place]. [email or web address] for PAF

## 2018-11-25 ENCOUNTER — Telehealth: Payer: Self-pay

## 2018-11-25 NOTE — Telephone Encounter (Signed)
COVID-19 Pre-Screening Questions:  Do you currently have a fever (>100 F), chills or unexplained body aches? NO   Are you currently experiencing new cough, shortness of breath, sore throat, runny nose?NO .  Have you recently travelled outside the state of [place] in the last 14 days? NO .  Have you been in contact with someone that is currently pending confirmation of Covid19 testing or has been confirmed to have the Covid19 virus?  NO  **If the patient answers NO to ALL questions -  advise the patient to please call the clinic before coming to the office should any symptoms develop.     

## 2018-11-26 ENCOUNTER — Other Ambulatory Visit: Payer: Self-pay | Admitting: Internal Medicine

## 2018-11-26 ENCOUNTER — Ambulatory Visit (INDEPENDENT_AMBULATORY_CARE_PROVIDER_SITE_OTHER): Payer: BLUE CROSS/BLUE SHIELD | Admitting: Internal Medicine

## 2018-11-26 ENCOUNTER — Other Ambulatory Visit: Payer: Self-pay

## 2018-11-26 ENCOUNTER — Encounter: Payer: Self-pay | Admitting: Internal Medicine

## 2018-11-26 VITALS — BP 124/76 | HR 65 | Temp 98.4°F

## 2018-11-26 DIAGNOSIS — F1090 Alcohol use, unspecified, uncomplicated: Secondary | ICD-10-CM

## 2018-11-26 DIAGNOSIS — Z23 Encounter for immunization: Secondary | ICD-10-CM

## 2018-11-26 DIAGNOSIS — B181 Chronic viral hepatitis B without delta-agent: Secondary | ICD-10-CM

## 2018-11-26 DIAGNOSIS — Z7289 Other problems related to lifestyle: Secondary | ICD-10-CM

## 2018-11-26 DIAGNOSIS — Z789 Other specified health status: Secondary | ICD-10-CM

## 2018-11-26 MED ORDER — TENOFOVIR DISOPROXIL FUMARATE 300 MG PO TABS: 300.0000 mg | ORAL_TABLET | Freq: Every day | ORAL | 11 refills | Status: DC

## 2018-11-26 NOTE — Progress Notes (Signed)
[name]: follow up for chronic b  Patient ID: Gregory Singh, male   DOB: [birth year], 58 y.o.   MRN: [id number]  HPI [name] is a 58yo [place] male with chronic hepatitis b currently on TDF for treatment, also has HTN, HLD. 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 He has been in good health. Outpatient Encounter Medications as of 11/26/2018  Medication Sig  . clobetasol ointment (TEMOVATE) 0.05 % Apply bid to rash on leg liberally, do not use on face or genitals  . Metoprolol-Hydrochlorothiazide 50-12.5 MG TB24 Take 0.5 tablets by mouth daily.  Marland Kitchen omega-3 acid ethyl esters (LOVAZA) 1 g capsule Take 2 capsules (2 g total) by mouth daily.  . predniSONE (DELTASONE) 20 MG tablet Take 1 tab 2x/day x3d, 1/2 tab 2x/day x3d, 1/2 tab daily x4d for neck inflammation  . tenofovir (VIREAD) 300 MG tablet Take 1 tablet (300 mg total) by mouth daily.  Marland Kitchen triamcinolone ointment (KENALOG) 0.5 % Apply 1 application topically 2 (two) times daily.  . [DISCONTINUED] tenofovir (VIREAD) 300 MG tablet TAKE 1 TABLET BY MOUTH ONCE DAILY   No facility-administered encounter medications on file as of 11/26/2018.      Patient Active Problem List   Diagnosis Date Noted  . Hepatitis B 01/19/2015  . Thrombocytopenia ([place]) 01/10/2015  . Tremor of both hands 01/10/2015  . High triglycerides 01/10/2015  . Neck pain 05/21/2011  . Shoulder pain 05/21/2011  . Neck pain on right side 05/15/2011     Health Maintenance Due  Topic Date Due  . COLONOSCOPY  08/25/2012  . INFLUENZA VACCINE  10/18/2018    Sochx: drinks 1-2 light beers  Review of Systems Review of Systems  Constitutional: Negative for fever, chills, diaphoresis, activity change, appetite change, fatigue and unexpected weight change.  HENT: Negative for congestion, sore throat, rhinorrhea, sneezing, trouble swallowing and sinus pressure.  Eyes: Negative for photophobia and visual disturbance.  Respiratory: Negative for cough,  chest tightness, shortness of breath, wheezing and stridor.  Cardiovascular: Negative for chest pain, palpitations and leg swelling.  Gastrointestinal: Negative for nausea, vomiting, abdominal pain, diarrhea, constipation, blood in stool, abdominal distention and anal bleeding.  Genitourinary: Negative for dysuria, hematuria, flank pain and difficulty urinating.  Musculoskeletal: Negative for myalgias, back pain, joint swelling, arthralgias and gait problem.  Skin: Negative for color change, pallor, rash and wound.  Neurological: Negative for dizziness, tremors, weakness and light-headedness.  Hematological: Negative for adenopathy. Does not bruise/bleed easily.  Psychiatric/Behavioral: Negative for behavioral problems, confusion, sleep disturbance, dysphoric mood, decreased concentration and agitation.    Physical Exam   BP 124/76   Pulse 65   Temp 98.4 F (36.9 C)   Physical Exam  Constitutional: He is oriented to person, place, and time. He appears well-developed and well-nourished. No distress.  HENT:  Mouth/Throat: Oropharynx is clear and moist. No oropharyngeal exudate.  Cardiovascular: Normal rate, regular rhythm and normal heart sounds. Exam reveals no gallop and no friction rub.  No murmur heard.  Pulmonary/Chest: Effort normal and breath sounds normal. No respiratory distress. He has no wheezes.  Abdominal: Soft. Bowel sounds are normal. He exhibits no distension. There is no tenderness.  Lymphadenopathy:  He has no cervical adenopathy.  Psychiatric: He has a normal mood and affect. His behavior is normal.    Lab Results  Component Value Date   HEPBSAB NON-REACTIVE 05/26/2018    CBC Lab Results  Component Value Date   WBC 7.6 11/25/2017   RBC  5.00 11/25/2017   HGB 17.7 (H) 11/25/2017   HCT 50.3 (H) 11/25/2017   PLT 101 (L) 11/25/2017   MCV 100.6 (H) 11/25/2017   MCH 35.4 (H) 11/25/2017   MCHC 35.2 11/25/2017   RDW 12.0 11/25/2017   LYMPHSABS 1,794 11/25/2017    MONOABS 477 04/24/2016   EOSABS 53 11/25/2017    BMET Lab Results  Component Value Date   NA 139 01/23/2018   K 3.7 01/23/2018   CL 104 01/23/2018   CO2 21 01/23/2018   GLUCOSE 142 (H) 01/23/2018   BUN 11 01/23/2018   CREATININE 0.78 01/23/2018   CALCIUM 9.6 01/23/2018   GFRNONAA 102 01/23/2018   GFRAA 117 01/23/2018      Assessment and Plan Chronic hepatitis B = plan to continue with tdf treatment. Will Get labs to see if HBsAb convert, also HBV VL. Recommend to cut back further on etoh use  HCC surveillance = will get Ultrasound of liver  Health maintenance = will give Flu shot   rtc in 59mo

## 2018-11-28 LAB — COMPLETE METABOLIC PANEL WITH GFR
AG Ratio: 1.7 (calc) (ref 1.0–2.5)
ALT: 52 U/L — ABNORMAL HIGH (ref 9–46)
AST: 78 U/L — ABNORMAL HIGH (ref 10–35)
Albumin: 4.2 g/dL (ref 3.6–5.1)
Alkaline phosphatase (APISO): 93 U/L (ref 35–144)
BUN: 9 mg/dL (ref 7–25)
CO2: 31 mmol/L (ref 20–32)
Calcium: 9.4 mg/dL (ref 8.6–10.3)
Chloride: 107 mmol/L (ref 98–110)
Creat: 0.81 mg/dL (ref 0.70–1.33)
GFR, Est African American: 115 mL/min/{1.73_m2} (ref >=60)
GFR, Est Non African American: 99 mL/min/{1.73_m2} (ref >=60)
Globulin: 2.5 g/dL (calc) (ref 1.9–3.7)
Glucose, Bld: 74 mg/dL (ref 65–99)
Potassium: 4 mmol/L (ref 3.5–5.3)
Sodium: 144 mmol/L (ref 135–146)
Total Bilirubin: 0.7 mg/dL (ref 0.2–1.2)
Total Protein: 6.7 g/dL (ref 6.1–8.1)

## 2018-11-28 LAB — CBC WITH DIFFERENTIAL/PLATELET
Absolute Monocytes: 372 cells/uL (ref 200–950)
Basophils Absolute: 32 cells/uL (ref 0–200)
Basophils Relative: 0.8 %
Eosinophils Absolute: 132 cells/uL (ref 15–500)
Eosinophils Relative: 3.3 %
HCT: 49.3 % (ref 38.5–50.0)
Hemoglobin: 17 g/dL (ref 13.2–17.1)
Lymphs Abs: 1732 cells/uL (ref 850–3900)
MCH: 35.1 pg — ABNORMAL HIGH (ref 27.0–33.0)
MCHC: 34.5 g/dL (ref 32.0–36.0)
MCV: 101.9 fL — ABNORMAL HIGH (ref 80.0–100.0)
MPV: 11.8 fL (ref 7.5–12.5)
Monocytes Relative: 9.3 %
Neutro Abs: 1732 cells/uL (ref 1500–7800)
Neutrophils Relative %: 43.3 %
Platelets: 83 10*3/uL — ABNORMAL LOW (ref 140–400)
RBC: 4.84 10*6/uL (ref 4.20–5.80)
RDW: 12 % (ref 11.0–15.0)
Total Lymphocyte: 43.3 %
WBC: 4 10*3/uL (ref 3.8–10.8)

## 2018-11-28 LAB — HEPATITIS B DNA, ULTRAQUANTITATIVE, PCR
Hepatitis B DNA (Calc): <1 Log IU/mL
Hepatitis B DNA: <10 IU/mL

## 2018-11-28 LAB — HEPATITIS B SURFACE ANTIBODY,QUALITATIVE: Hep B S Ab: NONREACTIVE

## 2018-11-28 LAB — AFP TUMOR MARKER: AFP-Tumor Marker: 7.8 ng/mL — ABNORMAL HIGH (ref <6.1)

## 2018-12-02 NOTE — Telephone Encounter (Signed)
[facility] Patient Advocate Encounter   [facility] pharmacy was successful in securing patient a [phone] remaining balance grant from Patient [place] (PAF) to provide copayment coverage for [place]. This will make the out of pocket cost $0.     The billing information is as follows:  RxBin: [id number] PCN: [id number] Member ID: [id number] Group ID: [id number] Dates of Eligibility: 11/28/2018 through 11/28/2019  PAF sent a form to be filled out and signed by the provider in regards to diagnosis verification. This information has been completed and faxed back to the foundation [phone].  Bartholomew Crews, CPhT Specialty Pharmacy Patient [facility] for Infectious Disease Phone: [phone] Fax: [phone] 12/02/2018 3:15 PM

## 2018-12-03 ENCOUNTER — Encounter: Payer: Self-pay | Admitting: Internal Medicine

## 2019-02-12 ENCOUNTER — Other Ambulatory Visit: Payer: Self-pay | Admitting: Physician Assistant

## 2019-02-12 NOTE — Telephone Encounter (Signed)
Forwarding medication refill request to PCP for review. 

## 2019-04-28 ENCOUNTER — Other Ambulatory Visit: Payer: Self-pay | Admitting: Internal Medicine

## 2019-04-28 DIAGNOSIS — B181 Chronic viral hepatitis B without delta-agent: Secondary | ICD-10-CM

## 2019-06-08 ENCOUNTER — Ambulatory Visit: Payer: BLUE CROSS/BLUE SHIELD | Admitting: Internal Medicine

## 2019-10-06 ENCOUNTER — Other Ambulatory Visit: Payer: Self-pay | Admitting: Internal Medicine

## 2019-10-06 DIAGNOSIS — B181 Chronic viral hepatitis B without delta-agent: Secondary | ICD-10-CM

## 2019-11-09 ENCOUNTER — Other Ambulatory Visit: Payer: Self-pay | Admitting: Internal Medicine

## 2019-11-09 DIAGNOSIS — B181 Chronic viral hepatitis B without delta-agent: Secondary | ICD-10-CM

## 2019-12-08 ENCOUNTER — Other Ambulatory Visit: Payer: Self-pay | Admitting: Internal Medicine

## 2019-12-08 DIAGNOSIS — B181 Chronic viral hepatitis B without delta-agent: Secondary | ICD-10-CM

## 2020-07-18 ENCOUNTER — Other Ambulatory Visit: Payer: Self-pay

## 2020-07-19 ENCOUNTER — Ambulatory Visit (INDEPENDENT_AMBULATORY_CARE_PROVIDER_SITE_OTHER): Payer: 59 | Admitting: Family Medicine

## 2020-07-19 ENCOUNTER — Encounter: Payer: Self-pay | Admitting: Family Medicine

## 2020-07-19 ENCOUNTER — Ambulatory Visit (INDEPENDENT_AMBULATORY_CARE_PROVIDER_SITE_OTHER): Payer: 59

## 2020-07-19 VITALS — BP 116/68 | HR 69 | Temp 98.0°F | Ht 64.0 in | Wt 133.0 lb

## 2020-07-19 DIAGNOSIS — I10 Essential (primary) hypertension: Secondary | ICD-10-CM

## 2020-07-19 DIAGNOSIS — Z7289 Other problems related to lifestyle: Secondary | ICD-10-CM

## 2020-07-19 DIAGNOSIS — Z720 Tobacco use: Secondary | ICD-10-CM

## 2020-07-19 DIAGNOSIS — Z0000 Encounter for general adult medical examination without abnormal findings: Secondary | ICD-10-CM | POA: Diagnosis not present

## 2020-07-19 DIAGNOSIS — Z8619 Personal history of other infectious and parasitic diseases: Secondary | ICD-10-CM

## 2020-07-19 DIAGNOSIS — R7989 Other specified abnormal findings of blood chemistry: Secondary | ICD-10-CM

## 2020-07-19 DIAGNOSIS — Z789 Other specified health status: Secondary | ICD-10-CM

## 2020-07-19 NOTE — Patient Instructions (Signed)
Health Maintenance, Male Adopting a healthy lifestyle and getting preventive care are important in promoting health and wellness. Ask your health care provider about:  The right schedule for you to have regular tests and exams.  Things you can do on your own to prevent diseases and keep yourself healthy. What should I know about diet, weight, and exercise? Eat a healthy diet  Eat a diet that includes plenty of vegetables, fruits, low-fat dairy products, and lean protein.  Do not eat a lot of foods that are high in solid fats, added sugars, or sodium.   Maintain a healthy weight Body mass index (BMI) is a measurement that can be used to identify possible weight problems. It estimates body fat based on height and weight. Your health care provider can help determine your BMI and help you achieve or maintain a healthy weight. Get regular exercise Get regular exercise. This is one of the most important things you can do for your health. Most adults should:  Exercise for at least 150 minutes each week. The exercise should increase your heart rate and make you sweat (moderate-intensity exercise).  Do strengthening exercises at least twice a week. This is in addition to the moderate-intensity exercise.  Spend less time sitting. Even light physical activity can be beneficial. Watch cholesterol and blood lipids Have your blood tested for lipids and cholesterol at 58 years of age, then have this test every 5 years. You may need to have your cholesterol levels checked more often if:  Your lipid or cholesterol levels are high.  You are older than 58 years of age.  You are at high risk for heart disease. What should I know about cancer screening? Many types of cancers can be detected early and may often be prevented. Depending on your health history and family history, you may need to have cancer screening at various ages. This may include screening for:  Colorectal cancer.  Prostate  cancer.  Skin cancer.  Lung cancer. What should I know about heart disease, diabetes, and high blood pressure? Blood pressure and heart disease  High blood pressure causes heart disease and increases the risk of stroke. This is more likely to develop in people who have high blood pressure readings, are of African descent, or are overweight.  Talk with your health care provider about your target blood pressure readings.  Have your blood pressure checked: ? Every 3-5 years if you are 18-39 years of age. ? Every year if you are 40 years old or older.  If you are between the ages of 65 and 75 and are a current or former smoker, ask your health care provider if you should have a one-time screening for abdominal aortic aneurysm (AAA). Diabetes Have regular diabetes screenings. This checks your fasting blood sugar level. Have the screening done:  Once every three years after age 45 if you are at a normal weight and have a low risk for diabetes.  More often and at a younger age if you are overweight or have a high risk for diabetes. What should I know about preventing infection? Hepatitis B If you have a higher risk for hepatitis B, you should be screened for this virus. Talk with your health care provider to find out if you are at risk for hepatitis B infection. Hepatitis C Blood testing is recommended for:  Everyone born from 1945 through 1965.  Anyone with known risk factors for hepatitis C. Sexually transmitted infections (STIs)  You should be screened each   year for STIs, including gonorrhea and chlamydia, if: ? You are sexually active and are younger than 58 years of age. ? You are older than 58 years of age and your health care provider tells you that you are at risk for this type of infection. ? Your sexual activity has changed since you were last screened, and you are at increased risk for chlamydia or gonorrhea. Ask your health care provider if you are at risk.  Ask your  health care provider about whether you are at high risk for HIV. Your health care provider may recommend a prescription medicine to help prevent HIV infection. If you choose to take medicine to prevent HIV, you should first get tested for HIV. You should then be tested every 3 months for as long as you are taking the medicine. Follow these instructions at home: Lifestyle  Do not use any products that contain nicotine or tobacco, such as cigarettes, e-cigarettes, and chewing tobacco. If you need help quitting, ask your health care provider.  Do not use street drugs.  Do not share needles.  Ask your health care provider for help if you need support or information about quitting drugs. Alcohol use  Do not drink alcohol if your health care provider tells you not to drink.  If you drink alcohol: ? Limit how much you have to 0-2 drinks a day. ? Be aware of how much alcohol is in your drink. In the [place] one drink equals one 12 oz bottle of beer (355 mL), one 5 oz glass of wine (148 mL), or one 1 oz glass of hard liquor (44 mL). General instructions  Schedule regular health, dental, and eye exams.  Stay current with your vaccines.  Tell your health care provider if: ? You often feel depressed. ? You have ever been abused or do not feel safe at home. Summary  Adopting a healthy lifestyle and getting preventive care are important in promoting health and wellness.  Follow your health care provider's instructions about healthy diet, exercising, and getting tested or screened for diseases.  Follow your health care provider's instructions on monitoring your cholesterol and blood pressure. This information is not intended to replace advice given to you by your health care provider. Make sure you discuss any questions you have with your health care provider. Document Revised: 02/26/2018 Document Reviewed: 02/26/2018 Elsevier Patient Education  2021 [employer]  Health Maintenance,  Male Adopting a healthy lifestyle and getting preventive care are important in promoting health and wellness. Ask your health care provider about:  The right schedule for you to have regular tests and exams.  Things you can do on your own to prevent diseases and keep yourself healthy. What should I know about diet, weight, and exercise? Eat a healthy diet  Eat a diet that includes plenty of vegetables, fruits, low-fat dairy products, and lean protein.  Do not eat a lot of foods that are high in solid fats, added sugars, or sodium.   Maintain a healthy weight Body mass index (BMI) is a measurement that can be used to identify possible weight problems. It estimates body fat based on height and weight. Your health care provider can help determine your BMI and help you achieve or maintain a healthy weight. Get regular exercise Get regular exercise. This is one of the most important things you can do for your health. Most adults should:  Exercise for at least 150 minutes each week. The exercise should increase your heart rate and   make you sweat (moderate-intensity exercise).  Do strengthening exercises at least twice a week. This is in addition to the moderate-intensity exercise.  Spend less time sitting. Even light physical activity can be beneficial. Watch cholesterol and blood lipids Have your blood tested for lipids and cholesterol at 60 years of age, then have this test every 5 years. You may need to have your cholesterol levels checked more often if:  Your lipid or cholesterol levels are high.  You are older than 58 years of age.  You are at high risk for heart disease. What should I know about cancer screening? Many types of cancers can be detected early and may often be prevented. Depending on your health history and family history, you may need to have cancer screening at various ages. This may include screening for:  Colorectal cancer.  Prostate cancer.  Skin  cancer.  Lung cancer. What should I know about heart disease, diabetes, and high blood pressure? Blood pressure and heart disease  High blood pressure causes heart disease and increases the risk of stroke. This is more likely to develop in people who have high blood pressure readings, are of African descent, or are overweight.  Talk with your health care provider about your target blood pressure readings.  Have your blood pressure checked: ? Every 3-5 years if you are 18-39 years of age. ? Every year if you are 40 years old or older.  If you are between the ages of 65 and 75 and are a current or former smoker, ask your health care provider if you should have a one-time screening for abdominal aortic aneurysm (AAA). Diabetes Have regular diabetes screenings. This checks your fasting blood sugar level. Have the screening done:  Once every three years after age 45 if you are at a normal weight and have a low risk for diabetes.  More often and at a younger age if you are overweight or have a high risk for diabetes. What should I know about preventing infection? Hepatitis B If you have a higher risk for hepatitis B, you should be screened for this virus. Talk with your health care provider to find out if you are at risk for hepatitis B infection. Hepatitis C Blood testing is recommended for:  Everyone born from 1945 through 1965.  Anyone with known risk factors for hepatitis C. Sexually transmitted infections (STIs)  You should be screened each year for STIs, including gonorrhea and chlamydia, if: ? You are sexually active and are younger than 58 years of age. ? You are older than 58 years of age and your health care provider tells you that you are at risk for this type of infection. ? Your sexual activity has changed since you were last screened, and you are at increased risk for chlamydia or gonorrhea. Ask your health care provider if you are at risk.  Ask your health care provider  about whether you are at high risk for HIV. Your health care provider may recommend a prescription medicine to help prevent HIV infection. If you choose to take medicine to prevent HIV, you should first get tested for HIV. You should then be tested every 3 months for as long as you are taking the medicine. Follow these instructions at home: Lifestyle  Do not use any products that contain nicotine or tobacco, such as cigarettes, e-cigarettes, and chewing tobacco. If you need help quitting, ask your health care provider.  Do not use street drugs.  Do not share needles.    Ask your health care provider for help if you need support or information about quitting drugs. Alcohol use  Do not drink alcohol if your health care provider tells you not to drink.  If you drink alcohol: ? Limit how much you have to 0-2 drinks a day. ? Be aware of how much alcohol is in your drink. In the [place] one drink equals one 12 oz bottle of beer (355 mL), one 5 oz glass of wine (148 mL), or one 1 oz glass of hard liquor (44 mL). General instructions  Schedule regular health, dental, and eye exams.  Stay current with your vaccines.  Tell your health care provider if: ? You often feel depressed. ? You have ever been abused or do not feel safe at home. Summary  Adopting a healthy lifestyle and getting preventive care are important in promoting health and wellness.  Follow your health care provider's instructions about healthy diet, exercising, and getting tested or screened for diseases.  Follow your health care provider's instructions on monitoring your cholesterol and blood pressure. This information is not intended to replace advice given to you by your health care provider. Make sure you discuss any questions you have with your health care provider. Document Revised: 02/26/2018 Document Reviewed: 02/26/2018 Elsevier Patient Education  2021 [employer]  Preventive Care 15-84 Years Old, Male Preventive  care refers to lifestyle choices and visits with your health care provider that can promote health and wellness. This includes:  A yearly physical exam. This is also called an annual wellness visit.  Regular dental and eye exams.  Immunizations.  Screening for certain conditions.  Healthy lifestyle choices, such as: ? Eating a healthy diet. ? Getting regular exercise. ? Not using drugs or products that contain nicotine and tobacco. ? Limiting alcohol use. What can I expect for my preventive care visit? Physical exam Your health care provider will check your:  Height and weight. These may be used to calculate your BMI (body mass index). BMI is a measurement that tells if you are at a healthy weight.  Heart rate and blood pressure.  Body temperature.  Skin for abnormal spots. Counseling Your health care provider may ask you questions about your:  Past medical problems.  Family's medical history.  Alcohol, tobacco, and drug use.  Emotional well-being.  Home life and relationship well-being.  Sexual activity.  Diet, exercise, and sleep habits.  Work and work Astron[employer].  Access to firearms. What immunizations do I need? Vaccines are usually given at various ages, according to a schedule. Your health care provider will recommend vaccines for you based on your age, medical history, and lifestyle or other factors, such as travel or where you work.   What tests do I need? Blood tests  Lipid and cholesterol levels. These may be checked every 5 years, or more often if you are over 14 years old.  Hepatitis C test.  Hepatitis B test. Screening  Lung cancer screening. You may have this screening every year starting at age 27 if you have a 30-pack-year history of smoking and currently smoke or have quit within the past 15 years.  Prostate cancer screening. Recommendations will vary depending on your family history and other risks.  Genital exam to check for testicular  cancer or hernias.  Colorectal cancer screening. ? All adults should have this screening starting at age 57 and continuing until age 27. ? Your health care provider may recommend screening at age 36 if you are at increased  risk. ? You will have tests every 1-10 years, depending on your results and the type of screening test.  Diabetes screening. ? This is done by checking your blood sugar (glucose) after you have not eaten for a while (fasting). ? You may have this done every 1-3 years.  STD (sexually transmitted disease) testing, if you are at risk. Follow these instructions at home: Eating and drinking  Eat a diet that includes fresh fruits and vegetables, whole grains, lean protein, and low-fat dairy products.  Take vitamin and mineral supplements as recommended by your health care provider.  Do not drink alcohol if your health care provider tells you not to drink.  If you drink alcohol: ? Limit how much you have to 0-2 drinks a day. ? Be aware of how much alcohol is in your drink. In the [place] one drink equals one 12 oz bottle of beer (355 mL), one 5 oz glass of wine (148 mL), or one 1 oz glass of hard liquor (44 mL).   Lifestyle  Take daily care of your teeth and gums. Brush your teeth every morning and night with fluoride toothpaste. Floss one time each day.  Stay active. Exercise for at least 30 minutes 5 or more days each week.  Do not use any products that contain nicotine or tobacco, such as cigarettes, e-cigarettes, and chewing tobacco. If you need help quitting, ask your health care provider.  Do not use drugs.  If you are sexually active, practice safe sex. Use a condom or other form of protection to prevent STIs (sexually transmitted infections).  If told by your health care provider, take low-dose aspirin daily starting at age 54.  Find healthy ways to cope with stress, such as: ? Meditation, yoga, or listening to music. ? Journaling. ? Talking to a trusted  person. ? Spending time with friends and family. Safety  Always wear your seat belt while driving or riding in a vehicle.  Do not drive: ? If you have been drinking alcohol. Do not ride with someone who has been drinking. ? When you are tired or distracted. ? While texting.  Wear a helmet and other protective equipment during sports activities.  If you have firearms in your house, make sure you follow all gun safety procedures. What's next?  Go to your health care provider once a year for an annual wellness visit.  Ask your health care provider how often you should have your eyes and teeth checked.  Stay up to date on all vaccines. This information is not intended to replace advice given to you by your health care provider. Make sure you discuss any questions you have with your health care provider. Document Revised: 12/02/2018 Document Reviewed: 02/27/2018 Elsevier Patient Education  2021 [employer].

## 2020-07-19 NOTE — Progress Notes (Signed)
Established Patient Office Visit  Subjective:  Patient ID: Gregory Singh, male    DOB: May 29, 1960  Age: 60 y.o. MRN: [id number]  CC:  Chief Complaint  Patient presents with  . Establish Care    NP/establish care needing referral to pulmonologist and Hepatologist.     HPI Gregory Singh presents for establishment of care.  He is accompanied by a friend.  History of hepatitis B.  He just developed a positive hepatitis B surface antigen this past year.  He had been positive for the hepatitis Be and core Ags prior to then.  He is not certain about how he contracted it.  He does not use IV drugs.  He is a [place] who has been in this country for 39 years.  Tested negative for hep C and HIV 6 years ago.  He drinks 2 beers daily.  He is smoking now about 6 cigarettes daily.  He owns his own business.  Nonfasting today.  Past Medical History:  Diagnosis Date  . Hypertension     No past surgical history on file.  Family History  Problem Relation Age of Onset  . Healthy Mother   . Diabetes Father   . Stroke Father     Social History   Socioeconomic History  . Marital status: Married    Spouse name: Not on file  . Number of children: Not on file  . Years of education: Not on file  . Highest education level: Not on file  Occupational History  . Not on file  Tobacco Use  . Smoking status: Current Every Day Smoker    Packs/day: 0.50    Types: Cigarettes  . Smokeless tobacco: Never Used  . Tobacco comment: cutting back  Vaping Use  . Vaping Use: Never used  Substance and Sexual Activity  . Alcohol use: Yes    Alcohol/week: 24.0 standard drinks    Types: 24 Standard drinks or equivalent per week    Comment: beer 2 daily  . Drug use: No  . Sexual activity: Yes  Other Topics Concern  . Not on file  Social History Narrative  . Not on file   Social Determinants of Health   Financial Resource Strain: Not on file  Food Insecurity: Not on file  Transportation Needs: Not on  file  Physical Activity: Not on file  Stress: Not on file  Social Connections: Not on file  Intimate Partner Violence: Not on file    Outpatient Medications Prior to Visit  Medication Sig Dispense Refill  . clobetasol ointment (TEMOVATE) 0.05 % Apply bid to rash on leg liberally, do not use on face or genitals 60 g 1  . gemfibrozil (LOPID) 600 MG tablet Take 600 mg by mouth 2 (two) times daily.    . Metoprolol-Hydrochlorothiazide 50-12.5 MG TB24 Take 0.5 tablets by mouth daily. 45 tablet 3  . omega-3 acid ethyl esters (LOVAZA) 1 g capsule Take 2 capsules (2 g total) by mouth daily. 30 capsule 2  . tenofovir (VIREAD) 300 MG tablet TAKE ONE Tablet BY MOUTH ONCE DAILY 30 tablet 0  . triamcinolone ointment (KENALOG) 0.5 % Apply 1 application topically 2 (two) times daily. 30 g 0  . predniSONE (DELTASONE) 20 MG tablet Take 1 tab 2x/day x3d, 1/2 tab 2x/day x3d, 1/2 tab daily x4d for neck inflammation (Patient not taking: Reported on 07/19/2020) 11 tablet 0   No facility-administered medications prior to visit.    Allergies  Allergen Reactions  . Ibuprofen Itching  ROS Review of Systems  Constitutional: Negative.   HENT: Negative.   Eyes: Negative for photophobia and visual disturbance.  Respiratory: Negative for chest tightness, shortness of breath and wheezing.   Cardiovascular: Negative.   Gastrointestinal: Negative.   Endocrine: Negative for polyphagia and polyuria.  Genitourinary: Negative.  Negative for difficulty urinating.  Musculoskeletal: Negative for gait problem.  Skin: Negative for color change and pallor.  Neurological: Negative for speech difficulty and weakness.  Hematological: Negative.   Psychiatric/Behavioral: Negative.    Depression screen [facility] 2/9 07/19/2020 11/26/2018 05/26/2018  Decreased Interest 0 0 0  Down, Depressed, Hopeless 0 0 0  PHQ - 2 Score 0 0 0      Objective:    Physical Exam Vitals and nursing note reviewed.  Constitutional:      General: He  is not in acute distress.    Appearance: Normal appearance. He is normal weight. He is not ill-appearing, toxic-appearing or diaphoretic.  HENT:     Head: Normocephalic and atraumatic.     Right Ear: Tympanic membrane, ear canal and external ear normal.     Left Ear: Tympanic membrane, ear canal and external ear normal.     Mouth/Throat:     Mouth: Mucous membranes are moist.     Pharynx: Oropharynx is clear. No oropharyngeal exudate or posterior oropharyngeal erythema.  Eyes:     General: No scleral icterus.       Right eye: No discharge.        Left eye: No discharge.     Extraocular Movements: Extraocular movements intact.     Conjunctiva/sclera: Conjunctivae normal.     Pupils: Pupils are equal, round, and reactive to light.  Neck:     Vascular: No carotid bruit.  Cardiovascular:     Rate and Rhythm: Normal rate and regular rhythm.     Pulses:          Carotid pulses are 2+ on the right side and 2+ on the left side. Pulmonary:     Effort: Pulmonary effort is normal.     Breath sounds: Normal breath sounds. Decreased air movement present.  Abdominal:     General: Abdomen is flat. Bowel sounds are normal. There is no distension.     Palpations: Abdomen is soft. There is no mass.     Tenderness: There is no abdominal tenderness. There is no guarding or rebound.     Hernia: No hernia is present.  Musculoskeletal:     Cervical back: No rigidity or tenderness.     Right lower leg: No edema.     Left lower leg: No edema.  Lymphadenopathy:     Cervical: No cervical adenopathy.  Skin:    General: Skin is warm and dry.  Neurological:     Mental Status: He is alert and oriented to person, place, and time.  Psychiatric:        Mood and Affect: Mood normal.        Behavior: Behavior normal.     BP 116/68   Pulse 69   Temp 98 F (36.7 C) (Temporal)   Ht 5\' 4"  (1.626 m)   Wt 133 lb (60.3 kg)   SpO2 96%   BMI 22.83 kg/m  Wt Readings from Last 3 Encounters:  07/19/20 133 lb  (60.3 kg)  05/26/18 125 lb (56.7 kg)  01/23/18 127 lb 3.2 oz (57.7 kg)     Health Maintenance Due  Topic Date Due  . COLONOSCOPY (Pts 45-38yrs Insurance coverage  will need to be confirmed)  Never done    There are no preventive care reminders to display for this patient.  Lab Results  Component Value Date   TSH 2.370 12/11/2016   Lab Results  Component Value Date   WBC 4.0 11/26/2018   HGB 17.0 11/26/2018   HCT 49.3 11/26/2018   MCV 101.9 (H) 11/26/2018   PLT 83 (L) 11/26/2018   Lab Results  Component Value Date   NA 144 11/26/2018   K 4.0 11/26/2018   CO2 31 11/26/2018   GLUCOSE 74 11/26/2018   BUN 9 11/26/2018   CREATININE 0.81 11/26/2018   BILITOT 0.7 11/26/2018   ALKPHOS 131 (H) 01/23/2018   AST 78 (H) 11/26/2018   ALT 52 (H) 11/26/2018   PROT 6.7 11/26/2018   ALBUMIN 4.1 01/23/2018   CALCIUM 9.4 11/26/2018   Lab Results  Component Value Date   CHOL 138 01/23/2018   Lab Results  Component Value Date   HDL 35 (L) 01/23/2018   Lab Results  Component Value Date   LDLCALC 41 01/23/2018   Lab Results  Component Value Date   TRIG 310 (H) 01/23/2018   Lab Results  Component Value Date   CHOLHDL 3.9 01/23/2018   No results found for: HGBA1C    Assessment & Plan:   Problem List Items Addressed This Visit   None   Visit Diagnoses    History of hepatitis B    -  Primary   Relevant Orders   Hepatic function panel   Ambulatory referral to Infectious Disease   Hepatitis C antibody   HIV Antibody (routine testing w rflx)   Tobacco use       Relevant Orders   Ambulatory referral to Pulmonology   DG Chest 2 View   Healthcare maintenance       Relevant Orders   Ambulatory referral to Gastroenterology   CBC   Basic metabolic panel   Lipid panel   Urinalysis, Routine w reflex microscopic   PSA   Alcohol use       Relevant Orders   Hepatic function panel   Elevated LFTs       Relevant Orders   Hepatic function panel   Hepatitis C antibody    Essential hypertension       Relevant Medications   gemfibrozil (LOPID) 600 MG tablet   Other Relevant Orders   CBC   Basic metabolic panel      No orders of the defined types were placed in this encounter.   Follow-up: Return in about 3 months (around 10/19/2020).  Patient was strongly advised to stop drinking and smoking immediately.  He will return fasting in the morning for above ordered blood work.  Chest x-ray today.  Given information on health maintenance and disease prevention.  Mliss Sax, MD

## 2020-07-19 NOTE — Addendum Note (Signed)
Addended by: Varney Biles on: 07/19/2020 04:50 PM   Modules accepted: Orders

## 2020-07-21 ENCOUNTER — Other Ambulatory Visit (INDEPENDENT_AMBULATORY_CARE_PROVIDER_SITE_OTHER): Payer: 59

## 2020-07-21 ENCOUNTER — Other Ambulatory Visit: Payer: Self-pay

## 2020-07-21 DIAGNOSIS — R7989 Other specified abnormal findings of blood chemistry: Secondary | ICD-10-CM | POA: Diagnosis not present

## 2020-07-21 DIAGNOSIS — Z7289 Other problems related to lifestyle: Secondary | ICD-10-CM | POA: Diagnosis not present

## 2020-07-21 DIAGNOSIS — Z8619 Personal history of other infectious and parasitic diseases: Secondary | ICD-10-CM

## 2020-07-21 DIAGNOSIS — I10 Essential (primary) hypertension: Secondary | ICD-10-CM

## 2020-07-21 DIAGNOSIS — Z0000 Encounter for general adult medical examination without abnormal findings: Secondary | ICD-10-CM | POA: Diagnosis not present

## 2020-07-21 DIAGNOSIS — Z789 Other specified health status: Secondary | ICD-10-CM

## 2020-07-21 LAB — CBC
HCT: 48.8 % (ref 39.0–52.0)
Hemoglobin: 17 g/dL (ref 13.0–17.0)
MCHC: 34.8 g/dL (ref 30.0–36.0)
MCV: 102.3 fl — ABNORMAL HIGH (ref 78.0–100.0)
Platelets: 89 10*3/uL — ABNORMAL LOW (ref 150.0–400.0)
RBC: 4.77 Mil/uL (ref 4.22–5.81)
RDW: 14 % (ref 11.5–15.5)
WBC: 6 10*3/uL (ref 4.0–10.5)

## 2020-07-21 LAB — HEPATIC FUNCTION PANEL
ALT: 65 U/L — ABNORMAL HIGH (ref 0–53)
AST: 71 U/L — ABNORMAL HIGH (ref 0–37)
Albumin: 4.2 g/dL (ref 3.5–5.2)
Alkaline Phosphatase: 125 U/L — ABNORMAL HIGH (ref 39–117)
Bilirubin, Direct: 0.3 mg/dL (ref 0.0–0.3)
Total Bilirubin: 1 mg/dL (ref 0.2–1.2)
Total Protein: 7.1 g/dL (ref 6.0–8.3)

## 2020-07-21 LAB — BASIC METABOLIC PANEL
BUN: 13 mg/dL (ref 6–23)
CO2: 28 mEq/L (ref 19–32)
Calcium: 9.6 mg/dL (ref 8.4–10.5)
Chloride: 102 mEq/L (ref 96–112)
Creatinine, Ser: 0.95 mg/dL (ref 0.40–1.50)
GFR: 88.61 mL/min (ref >60.00)
Glucose, Bld: 112 mg/dL — ABNORMAL HIGH (ref 70–99)
Potassium: 3.9 mEq/L (ref 3.5–5.1)
Sodium: 139 mEq/L (ref 135–145)

## 2020-07-21 LAB — URINALYSIS, ROUTINE W REFLEX MICROSCOPIC
Bilirubin Urine: NEGATIVE
Hgb urine dipstick: NEGATIVE
Ketones, ur: NEGATIVE
Leukocytes,Ua: NEGATIVE
Nitrite: NEGATIVE
RBC / HPF: NONE SEEN (ref 0–?)
Specific Gravity, Urine: 1.025 (ref 1.000–1.030)
Total Protein, Urine: NEGATIVE
Urine Glucose: NEGATIVE
Urobilinogen, UA: 0.2 (ref 0.0–1.0)
pH: 6 (ref 5.0–8.0)

## 2020-07-21 LAB — LIPID PANEL
Cholesterol: 150 mg/dL (ref 0–200)
HDL: 32.5 mg/dL — ABNORMAL LOW (ref >39.00)
LDL Cholesterol: 82 mg/dL (ref 0–99)
NonHDL: 117.83
Total CHOL/HDL Ratio: 5
Triglycerides: 181 mg/dL — ABNORMAL HIGH (ref 0.0–149.0)
VLDL: 36.2 mg/dL (ref 0.0–40.0)

## 2020-07-21 LAB — PSA: PSA: 0.56 ng/mL (ref 0.10–4.00)

## 2020-07-21 NOTE — Progress Notes (Signed)
Per orders of Dr. Kremer pt is here for labs, tolerated draw well.  

## 2020-07-22 LAB — HIV ANTIBODY (ROUTINE TESTING W REFLEX): HIV 1&2 Ab, 4th Generation: NONREACTIVE

## 2020-07-22 LAB — HEPATITIS C ANTIBODY
Hepatitis C Ab: NONREACTIVE
SIGNAL TO CUT-OFF: 0.01 (ref <1.00)

## 2020-08-02 ENCOUNTER — Ambulatory Visit: Payer: 59 | Admitting: Infectious Diseases

## 2020-08-02 NOTE — Addendum Note (Signed)
Addended by: Andrez Grime on: 08/02/2020 08:09 AM   Modules accepted: Level of Service

## 2020-08-05 ENCOUNTER — Other Ambulatory Visit: Payer: Self-pay

## 2020-08-05 ENCOUNTER — Encounter: Payer: Self-pay | Admitting: Internal Medicine

## 2020-08-05 ENCOUNTER — Ambulatory Visit (INDEPENDENT_AMBULATORY_CARE_PROVIDER_SITE_OTHER): Payer: 59 | Admitting: Internal Medicine

## 2020-08-05 VITALS — BP 115/76 | HR 66 | Temp 98.0°F | Wt 132.0 lb

## 2020-08-05 DIAGNOSIS — Z720 Tobacco use: Secondary | ICD-10-CM | POA: Diagnosis not present

## 2020-08-05 DIAGNOSIS — B181 Chronic viral hepatitis B without delta-agent: Secondary | ICD-10-CM | POA: Diagnosis not present

## 2020-08-05 DIAGNOSIS — Z7289 Other problems related to lifestyle: Secondary | ICD-10-CM | POA: Diagnosis not present

## 2020-08-05 DIAGNOSIS — Z789 Other specified health status: Secondary | ICD-10-CM

## 2020-08-05 MED ORDER — TENOFOVIR DISOPROXIL FUMARATE 300 MG PO TABS: 300.0000 mg | ORAL_TABLET | Freq: Every day | ORAL | 11 refills | Status: DC

## 2020-08-05 NOTE — Progress Notes (Signed)
[name]: chronic hepatitis b without hepatic coma  Patient ID: Gregory Singh, male   DOB: 20-Nov-1960, 60 y.o.   MRN: [id number]  HPI  59yo M with chronic hepatitis B without hepatic coma continues to take tenofovir daily. He was seen at [facility] for the past 1-28yrs due to insurance/out of network costs.  He reports that he has had 3 doses of covid vaccine. He has had some unintentional weight loss his Weight 132 -- usually weight is 138. His nadir 122 (has gained back 10 lb) -- worked too much he states- often missing meals  Worked really hard this past 18 months - didn't take any time off. Now not working as much - he states  No abdominal pain, no RUQ pain, no discoloration to stool, no jaundice  Last year colonoscopy feb 25 ,2021 what sounds like 3 polyps and needs to redo in 3 years  6 months ago had chest CT:   Also abd U/S: Nov 2021 INDICATION:HBV \ B18.0 Chronic viral hepatitis B without coma and with delta agent (HCC)  ADDITIONAL HISTORY: None.  COMPARISON: Abdominal ultrasound 12/03/2018   TECHNIQUE: Multi-planar real-time ultrasonography of the upper abdomen (right upper quadrant) using grayscale imaging, supplemented by color, power, and spectral Doppler as needed.   FINDINGS:   . Vascular: The inferior vena cava is patent. The visualized aorta is unremarkable.  . Pancreas: Incompletely imaged. Visualized portions are unremarkable.  . Liver: The liver measures 17.9 cm in its craniocaudal dimension. Diffusely increased echogenicity coarsening of the hepatic parenchyma. Geographic areas of fatty sparing without focal lesion. The portal and hepatic venous systems are patent with antegrade flow.  . Gallbladder: No gallstones or sludge. No gallbladder wall thickening or pericholecystic fluid. Negative sonographic Murphy's sign.  . Biliary: The maximum caliber of the visualized common bile duct is 3 mm. No intrahepatic or extrahepatic ductal dilatation.  . Right kidney: The right  kidney measures 10.5 cm in length. Normal size, contour, and echogenicity. No hydronephrosis or perinephric fluid. No focal mass is identified.  . Peritoneum: No ascites.  . Additional comments: None.  Outpatient Encounter Medications as of 08/05/2020  Medication Sig  . clobetasol ointment (TEMOVATE) 0.05 % Apply bid to rash on leg liberally, do not use on face or genitals  . gemfibrozil (LOPID) 600 MG tablet Take 600 mg by mouth 2 (two) times daily.  . Metoprolol-Hydrochlorothiazide 50-12.5 MG TB24 Take 0.5 tablets by mouth daily.  Marland Kitchen omega-3 acid ethyl esters (LOVAZA) 1 g capsule Take 2 capsules (2 g total) by mouth daily.  Marland Kitchen tenofovir (VIREAD) 300 MG tablet TAKE ONE Tablet BY MOUTH ONCE DAILY  . triamcinolone ointment (KENALOG) 0.5 % Apply 1 application topically 2 (two) times daily.   No facility-administered encounter medications on file as of 08/05/2020.    Sochx: + smoking and + drinking 3 budweiser/day  Patient Active Problem List   Diagnosis Date Noted  . Hepatitis B 01/19/2015  . Thrombocytopenia (HCC) 01/10/2015  . Tremor of both hands 01/10/2015  . High triglycerides 01/10/2015  . Neck pain 05/21/2011  . Shoulder pain 05/21/2011  . Neck pain on right side 05/15/2011     Health Maintenance Due  Topic Date Due  . COLONOSCOPY (Pts 45-62yrs Insurance coverage will need to be confirmed)  Never done     Review of Systems Review of Systems  Constitutional: Negative for fever, chills, diaphoresis, activity change, appetite change, fatigue and unexpected weight change.  HENT: Negative for congestion, sore throat, rhinorrhea, sneezing, trouble swallowing  and sinus pressure.  Eyes: Negative for photophobia and visual disturbance.  Respiratory: Negative for cough, chest tightness, shortness of breath, wheezing and stridor.  Cardiovascular: Negative for chest pain, palpitations and leg swelling.  Gastrointestinal: Negative for nausea, vomiting, abdominal pain, diarrhea,  constipation, blood in stool, abdominal distention and anal bleeding.  Genitourinary: Negative for dysuria, hematuria, flank pain and difficulty urinating.  Musculoskeletal: Negative for myalgias, back pain, joint swelling, arthralgias and gait problem.  Skin: Negative for color change, pallor, rash and wound.  Neurological: Negative for dizziness, tremors, weakness and light-headedness.  Hematological: Negative for adenopathy. Does not bruise/bleed easily.  Psychiatric/Behavioral: Negative for behavioral problems, confusion, sleep disturbance, dysphoric mood, decreased concentration and agitation.    Physical Exam   BP 115/76   Pulse 66   Temp 98 F (36.7 C) (Oral)   Wt 132 lb (59.9 kg)   BMI 22.66 kg/m    Physical Exam  Constitutional: He is oriented to person, place, and time. He appears well-developed and well-nourished. No distress.  HENT:  Mouth/Throat: Oropharynx is clear and moist. No oropharyngeal exudate.  Cardiovascular: Normal rate, regular rhythm and normal heart sounds. Exam reveals no gallop and no friction rub.  No murmur heard.  Pulmonary/Chest: Effort normal and breath sounds normal. No respiratory distress. He has no wheezes.  Abdominal: Soft. Bowel sounds are normal. He exhibits no distension. There is no tenderness.  Lymphadenopathy:  He has no cervical adenopathy.  Neurological: He is alert and oriented to person, place, and time.  Skin: Skin is warm and dry. No rash noted. No erythema.  Psychiatric: He has a normal mood and affect. His behavior is normal.    Lab Results  Component Value Date   HEPBSAB NON-REACTIVE 11/26/2018    CBC Lab Results  Component Value Date   WBC 6.0 07/21/2020   RBC 4.77 07/21/2020   HGB 17.0 07/21/2020   HCT 48.8 07/21/2020   PLT 89.0 (L) 07/21/2020   MCV 102.3 (H) 07/21/2020   MCH 35.1 (H) 11/26/2018   MCHC 34.8 07/21/2020   RDW 14.0 07/21/2020   LYMPHSABS 1,732 11/26/2018   MONOABS 477 04/24/2016   EOSABS 132  11/26/2018    BMET Lab Results  Component Value Date   NA 139 07/21/2020   K 3.9 07/21/2020   CL 102 07/21/2020   CO2 28 07/21/2020   GLUCOSE 112 (H) 07/21/2020   BUN 13 07/21/2020   CREATININE 0.95 07/21/2020   CALCIUM 9.6 07/21/2020   GFRNONAA 99 11/26/2018   GFRAA 115 11/26/2018      Assessment and Plan Chronic hepatitis b without hepatic coma =  - will do lab work  - ultrasound for [facility] screening  Alcohol dependence =Beer - 3 budweiser per night / see if can do bud light  Smoking - a pack last 3 days -- emphysema / has been cutting back- still motivated to decrease smoking  Thrombocytopenia= from chronic liver disease  Unintentional weight loss = recommend to do supplementation to dietary intake (additional snacks) ensure to take at least 3 meals per day. Suspect he was not taking in sufficient calorie intake during heavy months of work

## 2020-08-08 LAB — COMPREHENSIVE METABOLIC PANEL
AG Ratio: 1.5 (calc) (ref 1.0–2.5)
ALT: 74 U/L — ABNORMAL HIGH (ref 9–46)
AST: 91 U/L — ABNORMAL HIGH (ref 10–35)
Albumin: 4.2 g/dL (ref 3.6–5.1)
Alkaline phosphatase (APISO): 126 U/L (ref 35–144)
BUN: 13 mg/dL (ref 7–25)
CO2: 28 mmol/L (ref 20–32)
Calcium: 9.4 mg/dL (ref 8.6–10.3)
Chloride: 103 mmol/L (ref 98–110)
Creat: 1.01 mg/dL (ref 0.70–1.33)
Globulin: 2.8 g/dL (calc) (ref 1.9–3.7)
Glucose, Bld: 128 mg/dL — ABNORMAL HIGH (ref 65–99)
Potassium: 3.4 mmol/L — ABNORMAL LOW (ref 3.5–5.3)
Sodium: 140 mmol/L (ref 135–146)
Total Bilirubin: 0.8 mg/dL (ref 0.2–1.2)
Total Protein: 7 g/dL (ref 6.1–8.1)

## 2020-08-08 LAB — CBC
HCT: 47.5 % (ref 38.5–50.0)
Hemoglobin: 16.3 g/dL (ref 13.2–17.1)
MCH: 35.1 pg — ABNORMAL HIGH (ref 27.0–33.0)
MCHC: 34.3 g/dL (ref 32.0–36.0)
MCV: 102.4 fL — ABNORMAL HIGH (ref 80.0–100.0)
MPV: 11.8 fL (ref 7.5–12.5)
Platelets: 90 10*3/uL — ABNORMAL LOW (ref 140–400)
RBC: 4.64 10*6/uL (ref 4.20–5.80)
RDW: 12.6 % (ref 11.0–15.0)
WBC: 4.8 10*3/uL (ref 3.8–10.8)

## 2020-08-08 LAB — HEPATITIS B DNA, ULTRAQUANTITATIVE, PCR
Hepatitis B DNA (Calc): <1 Log IU/mL
Hepatitis B DNA: <10 IU/mL

## 2020-10-19 ENCOUNTER — Ambulatory Visit (INDEPENDENT_AMBULATORY_CARE_PROVIDER_SITE_OTHER): Payer: 59 | Admitting: Family Medicine

## 2020-10-19 ENCOUNTER — Encounter: Payer: Self-pay | Admitting: Family Medicine

## 2020-10-19 VITALS — BP 118/68 | HR 97 | Temp 102.5°F

## 2020-10-19 DIAGNOSIS — Z720 Tobacco use: Secondary | ICD-10-CM | POA: Diagnosis not present

## 2020-10-19 DIAGNOSIS — R509 Fever, unspecified: Secondary | ICD-10-CM | POA: Diagnosis not present

## 2020-10-19 DIAGNOSIS — Z7289 Other problems related to lifestyle: Secondary | ICD-10-CM

## 2020-10-19 DIAGNOSIS — N5201 Erectile dysfunction due to arterial insufficiency: Secondary | ICD-10-CM

## 2020-10-19 DIAGNOSIS — Z8619 Personal history of other infectious and parasitic diseases: Secondary | ICD-10-CM | POA: Diagnosis not present

## 2020-10-19 LAB — BASIC METABOLIC PANEL
BUN: 9 mg/dL (ref 6–23)
CO2: 23 mEq/L (ref 19–32)
Calcium: 9 mg/dL (ref 8.4–10.5)
Chloride: 100 mEq/L (ref 96–112)
Creatinine, Ser: 0.87 mg/dL (ref 0.40–1.50)
GFR: 95.35 mL/min (ref >60.00)
Glucose, Bld: 92 mg/dL (ref 70–99)
Potassium: 3.9 mEq/L (ref 3.5–5.1)
Sodium: 136 mEq/L (ref 135–145)

## 2020-10-19 LAB — URINALYSIS, ROUTINE W REFLEX MICROSCOPIC
Hgb urine dipstick: NEGATIVE
Ketones, ur: NEGATIVE
Leukocytes,Ua: NEGATIVE
Nitrite: NEGATIVE
RBC / HPF: NONE SEEN (ref 0–?)
Specific Gravity, Urine: 1.02 (ref 1.000–1.030)
Urine Glucose: NEGATIVE
Urobilinogen, UA: 1 (ref 0.0–1.0)
WBC, UA: NONE SEEN (ref 0–?)
pH: 6 (ref 5.0–8.0)

## 2020-10-19 LAB — CBC
HCT: 46.6 % (ref 39.0–52.0)
Hemoglobin: 16 g/dL (ref 13.0–17.0)
MCHC: 34.4 g/dL (ref 30.0–36.0)
MCV: 105 fl — ABNORMAL HIGH (ref 78.0–100.0)
Platelets: 57 10*3/uL — ABNORMAL LOW (ref 150.0–400.0)
RBC: 4.44 Mil/uL (ref 4.22–5.81)
RDW: 14.2 % (ref 11.5–15.5)
WBC: 4.5 10*3/uL (ref 4.0–10.5)

## 2020-10-19 LAB — CK: Total CK: 133 U/L (ref 7–232)

## 2020-10-19 LAB — SEDIMENTATION RATE: Sed Rate: 13 mm/hr (ref 0–20)

## 2020-10-19 MED ORDER — SILDENAFIL CITRATE 20 MG PO TABS: ORAL_TABLET | ORAL | 0 refills | Status: DC

## 2020-10-19 NOTE — Addendum Note (Signed)
Addended by: Kymberli Wiegand M on: 10/19/2020 10:12 AM   Modules accepted: Orders  

## 2020-10-19 NOTE — Progress Notes (Signed)
Established Patient Office Visit  Subjective:  Patient ID: Gregory Singh, male    DOB: October 27, 1960  Age: 60 y.o. MRN: [id number]  CC:  Chief Complaint  Patient presents with   Follow-up    Pt c/o dry mouth, chills, fever. No sore throat, cough or congestion. Pt states he possibly has ED    HPI Gregory Singh presents for a discussion of erectile dysfunction.  He is accompanied by his significant other.  They say there were no issues until a few months ago.  Currently under treatment for hepatitis B.  His significant other is aware.  She has accompanied him to his appointments with ID.  He has a 1 day history of fever.  He denies stuffy nose drainage sore throat cough difficulty breathing chest pain nausea vomiting diarrhea or rash.  He has a mild headache that is generalized.  Past Medical History:  Diagnosis Date   Hypertension     No past surgical history on file.  Family History  Problem Relation Age of Onset   Healthy Mother    Diabetes Father    Stroke Father     Social History   Socioeconomic History   Marital status: Married    Spouse name: Not on file   Number of children: Not on file   Years of education: Not on file   Highest education level: Not on file  Occupational History   Not on file  Tobacco Use   Smoking status: Every Day    Packs/day: 0.50    Types: Cigarettes   Smokeless tobacco: Never   Tobacco comments:    cutting back  Vaping Use   Vaping Use: Never used  Substance and Sexual Activity   Alcohol use: Yes    Alcohol/week: 24.0 standard drinks    Types: 24 Standard drinks or equivalent per week    Comment: beer 2 daily   Drug use: No   Sexual activity: Yes  Other Topics Concern   Not on file  Social History Narrative   Not on file   Social Determinants of Health   Financial Resource Strain: Not on file  Food Insecurity: Not on file  Transportation Needs: Not on file  Physical Activity: Not on file  Stress: Not on file  Social Connections:  Not on file  Intimate Partner Violence: Not on file    Outpatient Medications Prior to Visit  Medication Sig Dispense Refill   clobetasol ointment (TEMOVATE) 0.05 % Apply bid to rash on leg liberally, do not use on face or genitals 60 g 1   gemfibrozil (LOPID) 600 MG tablet Take 600 mg by mouth 2 (two) times daily.     Metoprolol-Hydrochlorothiazide 50-12.5 MG TB24 Take 0.5 tablets by mouth daily. 45 tablet 3   omega-3 acid ethyl esters (LOVAZA) 1 g capsule Take 2 capsules (2 g total) by mouth daily. 30 capsule 2   tenofovir (VIREAD) 300 MG tablet Take 1 tablet (300 mg total) by mouth daily. 30 tablet 11   triamcinolone ointment (KENALOG) 0.5 % Apply 1 application topically 2 (two) times daily. 30 g 0   No facility-administered medications prior to visit.    Allergies  Allergen Reactions   Ibuprofen Itching    ROS Review of Systems  Constitutional:  Positive for chills and fever. Negative for diaphoresis, fatigue and unexpected weight change.  HENT:  Negative for congestion, postnasal drip, rhinorrhea, sinus pressure, sinus pain and sore throat.   Eyes:  Negative for photophobia and visual disturbance.  Respiratory:  Negative for cough, shortness of breath and wheezing.   Cardiovascular:  Negative for chest pain.  Gastrointestinal:  Negative for abdominal pain, diarrhea, nausea and vomiting.  Genitourinary:  Negative for difficulty urinating, dysuria, frequency and urgency.  Musculoskeletal:  Negative for arthralgias and myalgias.  Skin:  Negative for rash.  Neurological:  Positive for headaches. Negative for weakness.  Psychiatric/Behavioral: Negative.       Objective:    Physical Exam Vitals and nursing note reviewed.  Constitutional:      General: He is not in acute distress.    Appearance: Normal appearance. He is normal weight. He is not ill-appearing, toxic-appearing or diaphoretic.  HENT:     Head: Normocephalic and atraumatic.     Right Ear: Tympanic membrane, ear  canal and external ear normal.     Left Ear: Tympanic membrane, ear canal and external ear normal.     Mouth/Throat:     Mouth: Mucous membranes are moist.     Pharynx: Oropharynx is clear. No oropharyngeal exudate or posterior oropharyngeal erythema.  Eyes:     General: No scleral icterus.       Right eye: No discharge.        Left eye: No discharge.     Extraocular Movements: Extraocular movements intact.     Conjunctiva/sclera: Conjunctivae normal.     Pupils: Pupils are equal, round, and reactive to light.  Neck:     Vascular: No carotid bruit.  Cardiovascular:     Rate and Rhythm: Normal rate and regular rhythm.  Pulmonary:     Effort: Pulmonary effort is normal.     Breath sounds: Normal breath sounds. No wheezing or rhonchi.  Abdominal:     General: Abdomen is flat. Bowel sounds are normal. There is no distension.     Palpations: There is no mass.     Tenderness: There is no abdominal tenderness. There is no guarding or rebound.     Hernia: No hernia is present.  Musculoskeletal:     Cervical back: No rigidity or tenderness.  Lymphadenopathy:     Cervical: No cervical adenopathy.  Skin:    General: Skin is warm and dry.     Findings: No rash.  Neurological:     Mental Status: He is alert and oriented to person, place, and time.  Psychiatric:        Mood and Affect: Mood normal.        Behavior: Behavior normal.    BP 118/68   Pulse 97   Temp (!) 102.5 F (39.2 C) (Oral)   SpO2 97%  Wt Readings from Last 3 Encounters:  08/05/20 132 lb (59.9 kg)  07/19/20 133 lb (60.3 kg)  05/26/18 125 lb (56.7 kg)     Health Maintenance Due  Topic Date Due   Pneumococcal Vaccine 38-108 Years old (1 - PCV) Never done   COLONOSCOPY (Pts 45-21yrs Insurance coverage will need to be confirmed)  Never done   Zoster Vaccines- Shingrix (1 of 2) Never done   COVID-19 Vaccine (4 - Booster for Pfizer series) 05/06/2020   INFLUENZA VACCINE  10/17/2020    There are no preventive care  reminders to display for this patient.  Lab Results  Component Value Date   TSH 2.370 12/11/2016   Lab Results  Component Value Date   WBC 4.8 08/05/2020   HGB 16.3 08/05/2020   HCT 47.5 08/05/2020   MCV 102.4 (H) 08/05/2020   PLT 90 (L) 08/05/2020  Lab Results  Component Value Date   NA 140 08/05/2020   K 3.4 (L) 08/05/2020   CO2 28 08/05/2020   GLUCOSE 128 (H) 08/05/2020   BUN 13 08/05/2020   CREATININE 1.01 08/05/2020   BILITOT 0.8 08/05/2020   ALKPHOS 125 (H) 07/21/2020   AST 91 (H) 08/05/2020   ALT 74 (H) 08/05/2020   PROT 7.0 08/05/2020   ALBUMIN 4.2 07/21/2020   CALCIUM 9.4 08/05/2020   GFR 88.61 07/21/2020   Lab Results  Component Value Date   CHOL 150 07/21/2020   Lab Results  Component Value Date   HDL 32.50 (L) 07/21/2020   Lab Results  Component Value Date   LDLCALC 82 07/21/2020   Lab Results  Component Value Date   TRIG 181.0 (H) 07/21/2020   Lab Results  Component Value Date   CHOLHDL 5 07/21/2020   No results found for: HGBA1C    Assessment & Plan:   Problem List Items Addressed This Visit   None Visit Diagnoses     History of hepatitis B    -  Primary   Tobacco use       Alcohol use       Erectile dysfunction due to arterial insufficiency       Relevant Medications   sildenafil (REVATIO) 20 MG tablet   FUO (fever of unknown origin)       Relevant Orders   CBC   Basic metabolic panel   Sedimentation rate   Serum protein electrophoresis with reflex   Lactate dehydrogenase   QuantiFERON-TB Gold Plus   CK (Creatine Kinase)   Blood culture (routine single)   Rheumatoid Factor   Antinuclear Antib (ANA)   HIV antibody (with reflex)   Urinalysis, Routine w reflex microscopic   Novel Coronavirus, NAA ([facility])       Meds ordered this encounter  Medications   sildenafil (REVATIO) 20 MG tablet    Sig: May take 3-5 tablets as needed daily 45 minutes prior to sex.    Dispense:  50 tablet    Refill:  0     Follow-up:  Return Return in 1 week if not improved.  Proceed to emergency room if worse..  Advised patient to please stop smoking.  Advised him to stop drinking.  Discussed groups that can help him and he is not interested at this time.  Advised him that drinking alcohol and smoking cigarettes could be contributing to his erectile dysfunction.  Work-up underway for FUO.  Given information on finding help for alcohol dependence.  Also given information on steps to quit take quitting smoking.  He is aware that his significant other is at risk.   Mliss Sax, MD

## 2020-10-19 NOTE — Addendum Note (Signed)
Addended by: Varney Biles on: 10/19/2020 10:12 AM   Modules accepted: Orders

## 2020-10-19 NOTE — Addendum Note (Signed)
Addended by: Renaldo Reel S on: 10/19/2020 11:29 AM   Modules accepted: Orders

## 2020-10-19 NOTE — Addendum Note (Signed)
Addended by: Varney Biles on: 10/19/2020 10:20 AM   Modules accepted: Orders

## 2020-10-20 LAB — LACTATE DEHYDROGENASE: LDH: 250 U/L (ref 120–250)

## 2020-10-20 LAB — SARS-COV-2, NAA 2 DAY TAT

## 2020-10-20 LAB — NOVEL CORONAVIRUS, NAA: SARS-CoV-2, NAA: DETECTED — AB

## 2020-10-21 ENCOUNTER — Encounter: Payer: Self-pay | Admitting: Family Medicine

## 2020-10-21 ENCOUNTER — Institutional Professional Consult (permissible substitution): Payer: 59 | Admitting: Internal Medicine

## 2020-10-21 ENCOUNTER — Telehealth (INDEPENDENT_AMBULATORY_CARE_PROVIDER_SITE_OTHER): Payer: 59 | Admitting: Family Medicine

## 2020-10-21 ENCOUNTER — Telehealth: Payer: Self-pay | Admitting: Family Medicine

## 2020-10-21 VITALS — Temp 97.9°F | Ht 64.0 in | Wt 132.0 lb

## 2020-10-21 DIAGNOSIS — U071 COVID-19: Secondary | ICD-10-CM

## 2020-10-21 DIAGNOSIS — N5201 Erectile dysfunction due to arterial insufficiency: Secondary | ICD-10-CM

## 2020-10-21 LAB — CULTURE, BLOOD (SINGLE)

## 2020-10-21 LAB — PROTEIN ELECTROPHORESIS, SERUM, WITH REFLEX
Albumin ELP: 3.9 g/dL (ref 3.8–4.8)
Alpha 1: 0.3 g/dL (ref 0.2–0.3)
Alpha 2: 0.8 g/dL (ref 0.5–0.9)
Beta 2: 0.3 g/dL (ref 0.2–0.5)
Beta Globulin: 0.4 g/dL (ref 0.4–0.6)
Gamma Globulin: 1.3 g/dL (ref 0.8–1.7)
Total Protein: 7 g/dL (ref 6.1–8.1)

## 2020-10-21 LAB — HIV ANTIBODY (ROUTINE TESTING W REFLEX): HIV 1&2 Ab, 4th Generation: NONREACTIVE

## 2020-10-21 LAB — QUANTIFERON-TB GOLD PLUS
Mitogen-NIL: 5.98 IU/mL
NIL: 0.04 IU/mL
QuantiFERON-TB Gold Plus: NEGATIVE
TB1-NIL: 0.01 IU/mL
TB2-NIL: 0.01 IU/mL

## 2020-10-21 LAB — RHEUMATOID FACTOR: Rheumatoid fact SerPl-aCnc: <14 IU/mL (ref <14)

## 2020-10-21 LAB — ANA: Anti Nuclear Antibody (ANA): NEGATIVE

## 2020-10-21 MED ORDER — NIRMATRELVIR/RITONAVIR (PAXLOVID)TABLET: 3.0000 | ORAL_TABLET | Freq: Two times a day (BID) | ORAL | 0 refills | Status: AC

## 2020-10-21 NOTE — Telephone Encounter (Signed)
Per Dr Doreene Burke:  Doreene Burke, Gregory Coventry, MD  Gregory Singh, CMA [id number], Gregory Singh Tested positive for Covid. Please put him on my schedule today. It looks like Ihave openings.    I tried to call pt and lvm for him to call back and schedule a video visit this afternoon, ok to use one of the 15 minute slots

## 2020-10-21 NOTE — Progress Notes (Signed)
Established Patient Office Visit  Subjective:  Patient ID: Gregory Singh, male    DOB: 22-Oct-1960  Age: 60 y.o. MRN: [id number]  CC:  Chief Complaint  Patient presents with   Covid Positive    Covid positive per patient fever 2 days ago but he feels fine today.     HPI Gregory Singh presents for follow-up of fever of unknown origin.  Testing has been negative to date but COVID test did come back positive.  He is feeling okay.  No longer having a fever.  His mouth is dry and he has a sore throat but denies cough or difficulty breathing.  Patient is a smoker.  He is being treated for hepatitis B.  He was unable to fill his Revatio prescription.  Past Medical History:  Diagnosis Date   Hypertension     History reviewed. No pertinent surgical history.  Family History  Problem Relation Age of Onset   Healthy Mother    Diabetes Father    Stroke Father     Social History   Socioeconomic History   Marital status: Married    Spouse name: Not on file   Number of children: Not on file   Years of education: Not on file   Highest education level: Not on file  Occupational History   Not on file  Tobacco Use   Smoking status: Every Day    Packs/day: 0.50    Types: Cigarettes   Smokeless tobacco: Never   Tobacco comments:    cutting back  Vaping Use   Vaping Use: Never used  Substance and Sexual Activity   Alcohol use: Yes    Alcohol/week: 24.0 standard drinks    Types: 24 Standard drinks or equivalent per week    Comment: beer 2 daily   Drug use: No   Sexual activity: Yes  Other Topics Concern   Not on file  Social History Narrative   Not on file   Social Determinants of Health   Financial Resource Strain: Not on file  Food Insecurity: Not on file  Transportation Needs: Not on file  Physical Activity: Not on file  Stress: Not on file  Social Connections: Not on file  Intimate Partner Violence: Not on file    Outpatient Medications Prior to Visit  Medication Sig  Dispense Refill   clobetasol ointment (TEMOVATE) 0.05 % Apply bid to rash on leg liberally, do not use on face or genitals 60 g 1   Metoprolol-Hydrochlorothiazide 50-12.5 MG TB24 Take 0.5 tablets by mouth daily. 45 tablet 3   omega-3 acid ethyl esters (LOVAZA) 1 g capsule Take 2 capsules (2 g total) by mouth daily. 30 capsule 2   tenofovir (VIREAD) 300 MG tablet Take 1 tablet (300 mg total) by mouth daily. 30 tablet 11   gemfibrozil (LOPID) 600 MG tablet Take 600 mg by mouth 2 (two) times daily.     sildenafil (REVATIO) 20 MG tablet May take 3-5 tablets as needed daily 45 minutes prior to sex. (Patient not taking: Reported on 10/21/2020) 50 tablet 0   triamcinolone ointment (KENALOG) 0.5 % Apply 1 application topically 2 (two) times daily. (Patient not taking: Reported on 10/21/2020) 30 g 0   No facility-administered medications prior to visit.    Allergies  Allergen Reactions   Ibuprofen Itching    ROS Review of Systems  Constitutional:  Negative for diaphoresis, fatigue, fever and unexpected weight change.  HENT:  Positive for sore throat. Negative for trouble swallowing and voice  change.   Respiratory:  Negative for cough and wheezing.   Cardiovascular: Negative.   Gastrointestinal:  Negative for nausea and vomiting.  Musculoskeletal:  Negative for arthralgias and myalgias.  Neurological:  Negative for headaches.     Objective:    Physical Exam Vitals and nursing note reviewed.  Constitutional:      Appearance: Normal appearance.  Eyes:     Conjunctiva/sclera: Conjunctivae normal.  Pulmonary:     Effort: Pulmonary effort is normal.  Neurological:     Mental Status: He is alert and oriented to person, place, and time.  Psychiatric:        Mood and Affect: Mood normal.        Behavior: Behavior normal.    Temp 97.9 F (36.6 C) (Temporal)   Ht 5\' 4"  (1.626 m)   Wt 132 lb (59.9 kg)   BMI 22.66 kg/m  Wt Readings from Last 3 Encounters:  10/21/20 132 lb (59.9 kg)   08/05/20 132 lb (59.9 kg)  07/19/20 133 lb (60.3 kg)     Health Maintenance Due  Topic Date Due   Pneumococcal Vaccine 43-26 Years old (1 - PCV) Never done   COLONOSCOPY (Pts 45-46yrs Insurance coverage will need to be confirmed)  Never done   Zoster Vaccines- Shingrix (1 of 2) Never done   INFLUENZA VACCINE  10/17/2020    There are no preventive care reminders to display for this patient.  Lab Results  Component Value Date   TSH 2.370 12/11/2016   Lab Results  Component Value Date   WBC 4.5 10/19/2020   HGB 16.0 10/19/2020   HCT 46.6 10/19/2020   MCV 105.0 (H) 10/19/2020   PLT 57.0 (L) 10/19/2020   Lab Results  Component Value Date   NA 136 10/19/2020   K 3.9 10/19/2020   CO2 23 10/19/2020   GLUCOSE 92 10/19/2020   BUN 9 10/19/2020   CREATININE 0.87 10/19/2020   BILITOT 0.8 08/05/2020   ALKPHOS 125 (H) 07/21/2020   AST 91 (H) 08/05/2020   ALT 74 (H) 08/05/2020   PROT 7.0 08/05/2020   ALBUMIN 4.2 07/21/2020   CALCIUM 9.0 10/19/2020   GFR 95.35 10/19/2020   Lab Results  Component Value Date   CHOL 150 07/21/2020   Lab Results  Component Value Date   HDL 32.50 (L) 07/21/2020   Lab Results  Component Value Date   LDLCALC 82 07/21/2020   Lab Results  Component Value Date   TRIG 181.0 (H) 07/21/2020   Lab Results  Component Value Date   CHOLHDL 5 07/21/2020   No results found for: HGBA1C    Assessment & Plan:   Problem List Items Addressed This Visit   None Visit Diagnoses     COVID-19    -  Primary   Relevant Medications   nirmatrelvir/ritonavir EUA (PAXLOVID) TABS   Erectile dysfunction due to arterial insufficiency           Meds ordered this encounter  Medications   nirmatrelvir/ritonavir EUA (PAXLOVID) TABS    Sig: Take 3 tablets by mouth 2 (two) times daily for 5 days. (Take nirmatrelvir 150 mg two tablets twice daily for 5 days and ritonavir 100 mg one tablet twice daily for 5 days) Patient GFR is 95    Dispense:  30 tablet     Refill:  0    Follow-up: Return in about 5 days (around 10/26/2020), or if symptoms worsen or fail to improve.  Discussed good Rx with patient.  He will look up Revatio on good Rx and give me a pharmacy to use.  We will revisit this after he finishes the Paxlovid.  Mliss Sax, MD  Virtual Visit via Video Note  I connected with Gregory Singh on 10/21/20 at  4:00 PM EDT by a video enabled telemedicine application and verified that I am speaking with the correct person using two identifiers.  Location: Patient: home with significant other.  Provider: work   I discussed the limitations of evaluation and management by telemedicine and the availability of in person appointments. The patient expressed understanding and agreed to proceed.  History of Present Illness:    Observations/Objective:   Assessment and Plan:   Follow Up Instructions:    I discussed the assessment and treatment plan with the patient. The patient was provided an opportunity to ask questions and all were answered. The patient agreed with the plan and demonstrated an understanding of the instructions.   The patient was advised to call back or seek an in-person evaluation if the symptoms worsen or if the condition fails to improve as anticipated.  I provided 20 minutes of non-face-to-face time during this encounter.   Mliss Sax, MD

## 2020-10-21 NOTE — Progress Notes (Signed)
Lvm for pt to call back so we can schedule him with a video visit this afternoon. Okay to use one of the 15 minute slots at 4 or 4:15

## 2020-10-28 ENCOUNTER — Telehealth: Payer: Self-pay | Admitting: Family Medicine

## 2020-10-28 NOTE — Telephone Encounter (Signed)
Spoke with patient who states that he have completed the medication for covid now he os wanting to know if he can take the prescription for ED? Per patient he was asked to not take ED meds until he finished covid meds. Not sure if he can pick up the prescription or if Provider was going to send in something different. Please advise.

## 2020-10-28 NOTE — Telephone Encounter (Signed)
Pt called and said said that Dr Doreene Burke said he was going to send a prescription for him on 10/26/20 but I dont see where anything was sent in. Please advise

## 2020-10-29 ENCOUNTER — Other Ambulatory Visit: Payer: Self-pay | Admitting: Family Medicine

## 2020-10-29 DIAGNOSIS — N5201 Erectile dysfunction due to arterial insufficiency: Secondary | ICD-10-CM

## 2020-10-31 NOTE — Telephone Encounter (Signed)
Patient aware that Rx sent to pharmacy and ready for pick up. Also informed patient that Rx not covered by insurance and will be $280 with discount card.

## 2020-11-08 ENCOUNTER — Encounter: Payer: Self-pay | Admitting: Internal Medicine

## 2020-11-08 ENCOUNTER — Ambulatory Visit (INDEPENDENT_AMBULATORY_CARE_PROVIDER_SITE_OTHER): Payer: 59 | Admitting: Internal Medicine

## 2020-11-08 ENCOUNTER — Other Ambulatory Visit: Payer: Self-pay

## 2020-11-08 DIAGNOSIS — F17210 Nicotine dependence, cigarettes, uncomplicated: Secondary | ICD-10-CM

## 2020-11-08 DIAGNOSIS — J449 Chronic obstructive pulmonary disease, unspecified: Secondary | ICD-10-CM | POA: Diagnosis not present

## 2020-11-08 NOTE — Assessment & Plan Note (Addendum)
Counseled re importance of smoking cessation but did not meet time criteria for separate billing    Low-dose CT lung cancer screening is recommended for patients who are 94-58 years of age with a 30+ pack-year history of smoking, and who are currently smoking or quit <=15 years ago.   >>> referred to Kandice Robinsons NP for shared decision making            Each maintenance medication was reviewed in detail including emphasizing most importantly the difference between maintenance and prns and under what circumstances the prns are to be triggered using an action plan format where appropriate.  Total time for H and P, chart review, counseling,  directly observing portions of ambulatory 02 saturation study/ and generating customized AVS unique to this office visit / same day charting = 38 min

## 2020-11-08 NOTE — Progress Notes (Signed)
Gregory Singh, male    DOB: 1960/07/22,    MRN: [id number]   Brief patient profile:  60 yo Gregory Singh (Gregory Singh) male active smoker in [place]  referred to pulmonary clinic 11/08/2020 by Dr   Farris Has for asthma/copd eval.      History of Present Illness  11/08/2020  Pulmonary/ 1st office eval/Gregory Singh  Chief Complaint  Patient presents with   Consult    Patient reports he has no concerns.  Dyspnea:  Not limited by breathing from desired activities  / not aerobic  Cough: none  Sleep: no resp complaints flat bed/ one or two pillows SABA use: none   No obvious day to day or daytime variability or assoc excess/ purulent sputum or mucus plugs or hemoptysis or cp or chest tightness, subjective wheeze or overt sinus or hb symptoms.   Sleeping  without nocturnal  or early am exacerbation  of respiratory  c/o's or need for noct saba. Also denies any obvious fluctuation of symptoms with weather or environmental changes or other aggravating or alleviating factors except as outlined above   No unusual exposure hx or h/o childhood pna/ asthma or knowledge of premature birth.  Current Allergies, Complete Past Medical History, Past Surgical History, Family History, and Social History were reviewed in [employer] record.  ROS  The following are not active complaints unless bolded Hoarseness, sore throat, dysphagia, dental problems, itching, sneezing,  nasal congestion or discharge of excess mucus or purulent secretions, ear ache,   fever, chills, sweats, unintended wt loss or wt gain, classically pleuritic or exertional cp,  orthopnea pnd or arm/hand swelling  or leg swelling, presyncope, palpitations, abdominal pain, anorexia, nausea, vomiting, diarrhea  or change in bowel habits or change in bladder habits, change in stools or change in urine, dysuria, hematuria,  rash, arthralgias, visual complaints, headache, numbness, weakness or ataxia or problems with walking or coordination,  change in mood or   memory.           Past Medical History:  Diagnosis Date   Hypertension     Outpatient Medications Prior to Visit  Medication Sig Dispense Refill   clobetasol ointment (TEMOVATE) 0.05 % Apply bid to rash on leg liberally, do not use on face or genitals 60 g 1   gemfibrozil (LOPID) 600 MG tablet Take 600 mg by mouth 2 (two) times daily.     Metoprolol-Hydrochlorothiazide 50-12.5 MG TB24 Take 0.5 tablets by mouth daily. 45 tablet 3   omega-3 acid ethyl esters (LOVAZA) 1 g capsule Take 2 capsules (2 g total) by mouth daily. 30 capsule 2   tenofovir (VIREAD) 300 MG tablet Take 1 tablet (300 mg total) by mouth daily. 30 tablet 11   sildenafil (REVATIO) 20 MG tablet May take 3-5 tablets as needed daily 45 minutes prior to sex. (Patient not taking: No sig reported) 50 tablet 0   No facility-administered medications prior to visit.     Objective:     BP 116/74 (BP Location: Left Arm, Patient Position: Sitting, Cuff Size: Normal)   Pulse 84   Ht 5\' 6"  (1.676 m)   Wt 133 lb 3.2 oz (60.4 kg)   SpO2 96% Comment: ra  BMI 21.50 kg/m   SpO2: 96 % (ra)  Pleasant amb vietnamese male nad   HEENT : pt wearing mask not removed for exam due to covid - 19 concerns.   NECK :  without JVD/Nodes/TM/ nl carotid upstrokes bilaterally   LUNGS: no acc muscle use,  Min  barrel  contour chest wall with bilateral  slightly decreased bs s audible wheeze and  without cough on insp or exp maneuvers and min  Hyperresonant  to  percussion bilaterally     CV:  RRR  no s3 or murmur or increase in P2, and no edema   ABD:  soft and nontender with pos end  insp Hoover's  in the supine position. No bruits or organomegaly appreciated, bowel sounds nl  MS:   Nl gait/  ext warm without deformities, calf tenderness, cyanosis or clubbing No obvious joint restrictions   SKIN: warm and dry without lesions    NEURO:  alert, approp, nl sensorium with  no motor or cerebellar deficits apparent.         I  personally reviewed images and agree with radiology impression as follows:  CXR:   07/19/20  No active cardiopulmonary disease.     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/ active smoker Active smoker / asymtomatic though not aerobically active  -  11/08/2020   Walked RA  2 laps @ approx 244ft each @ nl pace  stopped due to end of study  no sob with sats 96%     I reviewed the Fletcher curve with the patient that basically indicates  if you quit smoking when your best day FEV1 is still well preserved (as is likelystill  the case here)  it is highly unlikely you will progress to severe disease and informed the patient there was  no medication on the market that has proven to alter the curve/ its downward trajectory  or the likelihood of progression of their disease(unlike other chronic medical conditions such as atheroclerosis where we do think we can change the natural hx with risk reducing meds)    Therefore stopping smoking and maintaining abstinence are  the most important aspects of care, not choice of inhalers or for that matter, doctors.   Treatment other than smoking cessation  is entirely directed by severity of symptoms and focused also on reducing exacerbations, not attempting to change the natural history of the disease.  Since no symptoms or flares of aecopd no need for rx at this point other than smoking cessation (see separate a/p)   >>> rec baseline pfts/ lung cancer screening and f/u in pulmonary prn.      Cigarette smoker Counseled re importance of smoking cessation but did not meet time criteria for separate billing    Low-dose CT lung cancer screening is recommended for patients who are 54-23 years of age with a 30+ pack-year history of smoking, and who are currently smoking or quit <=15 years ago.   >>> referred to Kandice Robinsons NP for shared decision making         Each maintenance medication was reviewed in detail including emphasizing most importantly the difference between  maintenance and prns and under what circumstances the prns are to be triggered using an action plan format where appropriate.  Total time for H and P, chart review, counseling,  directly observing portions of ambulatory 02 saturation study/ and generating customized AVS unique to this office visit / same day charting = 38 min               Sandrea Hughs, MD 11/08/2020

## 2020-11-08 NOTE — Assessment & Plan Note (Addendum)
Active smoker / asymtomatic though not aerobically active  -  11/08/2020   Walked RA  2 laps @ approx 213ft each @ nl pace  stopped due to end of study  no sob with sats 96%     I reviewed the Fletcher curve with the patient that basically indicates  if you quit smoking when your best day FEV1 is still well preserved (as is likelystill  the case here)  it is highly unlikely you will progress to severe disease and informed the patient there was  no medication on the market that has proven to alter the curve/ its downward trajectory  or the likelihood of progression of their disease(unlike other chronic medical conditions such as atheroclerosis where we do think we can change the natural hx with risk reducing meds)    Therefore stopping smoking and maintaining abstinence are  the most important aspects of care, not choice of inhalers or for that matter, doctors.   Treatment other than smoking cessation  is entirely directed by severity of symptoms and focused also on reducing exacerbations, not attempting to change the natural history of the disease.  Since no symptoms or flares of aecopd no need for rx at this point other than smoking cessation (see separate a/p)   >>> rec baseline pfts/ lung cancer screening and f/u in pulmonary prn.

## 2020-11-08 NOTE — Patient Instructions (Addendum)
We will refer you to Kandice Robinsons NP for lung cancer screening   We will walk you as fast as possible to see if your oxygen level drops with exercise   We will schedule you for PFTs and I will call you with the results   The key is to stop smoking completely before smoking completely stops you!   Pulmonary follow up is as needed

## 2020-11-08 NOTE — Addendum Note (Signed)
Addended by: Arvilla Market on: 11/08/2020 04:38 PM   Modules accepted: Orders

## 2020-11-16 ENCOUNTER — Ambulatory Visit (HOSPITAL_COMMUNITY)
Admission: RE | Admit: 2020-11-16 | Discharge: 2020-11-16 | Disposition: A | Discharge status: Home / Self Care | Payer: 59 | Source: Ambulatory Visit | Attending: Internal Medicine | Admitting: Internal Medicine

## 2020-11-16 ENCOUNTER — Other Ambulatory Visit: Payer: Self-pay

## 2020-11-16 DIAGNOSIS — B181 Chronic viral hepatitis B without delta-agent: Secondary | ICD-10-CM | POA: Insufficient documentation

## 2020-12-12 ENCOUNTER — Other Ambulatory Visit: Payer: Self-pay | Admitting: *Deleted

## 2020-12-12 DIAGNOSIS — Z87891 Personal history of nicotine dependence: Secondary | ICD-10-CM

## 2020-12-12 DIAGNOSIS — F17210 Nicotine dependence, cigarettes, uncomplicated: Secondary | ICD-10-CM

## 2020-12-13 ENCOUNTER — Telehealth: Payer: Self-pay | Admitting: Family Medicine

## 2020-12-13 DIAGNOSIS — I10 Essential (primary) hypertension: Secondary | ICD-10-CM

## 2020-12-14 ENCOUNTER — Telehealth: Payer: Self-pay | Admitting: Family Medicine

## 2020-12-14 ENCOUNTER — Other Ambulatory Visit: Payer: Self-pay

## 2020-12-14 DIAGNOSIS — Z0000 Encounter for general adult medical examination without abnormal findings: Secondary | ICD-10-CM

## 2020-12-14 MED ORDER — GEMFIBROZIL 600 MG PO TABS: 600.0000 mg | ORAL_TABLET | Freq: Two times a day (BID) | ORAL | 0 refills | Status: DC

## 2020-12-14 NOTE — Telephone Encounter (Signed)
Refill for gemfibrozil sent in patient aware

## 2020-12-14 NOTE — Telephone Encounter (Signed)
Rx refill request sent 12/14/2020. Sw,cma

## 2021-01-02 ENCOUNTER — Encounter: Payer: Self-pay | Admitting: Acute Care

## 2021-01-02 ENCOUNTER — Other Ambulatory Visit: Payer: Self-pay

## 2021-01-02 ENCOUNTER — Ambulatory Visit (INDEPENDENT_AMBULATORY_CARE_PROVIDER_SITE_OTHER): Payer: 59 | Admitting: Acute Care

## 2021-01-02 DIAGNOSIS — F17210 Nicotine dependence, cigarettes, uncomplicated: Secondary | ICD-10-CM

## 2021-01-02 NOTE — Progress Notes (Signed)
Virtual Visit via Telephone Note  I connected with Gregory Singh on 01/02/21 at  4:30 PM EDT by telephone and verified that I am speaking with the correct person using two identifiers.  Location: Patient: At home Provider: [address] [place], [place], [address]    I discussed the limitations, risks, security and privacy concerns of performing an evaluation and management service by telephone and the availability of in person appointments. I also discussed with the patient that there may be a patient responsible charge related to this service. The patient expressed understanding and agreed to proceed.  Shared Decision Making Visit Lung Cancer Screening Program [phone])   Eligibility: Age 60 y.o. Pack Years Smoking History Calculation 25 pack year smoking history (# packs/per year x # years smoked) Recent History of coughing up blood  no Unexplained weight loss? no ( >Than 15 pounds within the last 6 months ) Prior History Lung / other cancer no (Diagnosis within the last 5 years already requiring surveillance chest CT Scans). Smoking Status Current Smoker Former Smokers: Years since quit: NA  Quit Date: NA  Visit Components: Discussion included one or more decision making aids. yes Discussion included risk/benefits of screening. yes Discussion included potential follow up diagnostic testing for abnormal scans. yes Discussion included meaning and risk of over diagnosis. yes Discussion included meaning and risk of False Positives. yes Discussion included meaning of total radiation exposure. yes  Counseling Included: Importance of adherence to annual lung cancer LDCT screening. yes Impact of comorbidities on ability to participate in the program. yes Ability and willingness to under diagnostic treatment. yes  Smoking Cessation Counseling: Current Smokers:  Discussed importance of smoking cessation. yes Information about tobacco cessation classes and interventions provided  to patient. yes Patient provided with "ticket" for LDCT Scan. yes Symptomatic Patient. no  Counseling NA Diagnosis Code: Tobacco Use Z72.0 Asymptomatic Patient yes  Counseling (Intermediate counseling: > three minutes counseling) [id number] Former Smokers:  Discussed the importance of maintaining cigarette abstinence. yes Diagnosis Code: Personal History of Nicotine Dependence. K35.465 Information about tobacco cessation classes and interventions provided to patient. Yes Patient provided with "ticket" for LDCT Scan. yes Written Order for Lung Cancer Screening with LDCT placed in Epic. Yes (CT Chest Lung Cancer Screening Low Dose W/O CM) [id number] Z12.2-Screening of respiratory organs Z87.891-Personal history of nicotine dependence  I have spent 25 minutes of face to face time with Gregory Singh discussing the risks and benefits of lung cancer screening. We viewed a power point together that explained in detail the above noted topics. We paused at intervals to allow for questions to be asked and answered to ensure understanding.We discussed that the single most powerful action that he can take to decrease his risk of developing lung cancer is to quit smoking. We discussed whether or not he is ready to commit to setting a quit date. We discussed options for tools to aid in quitting smoking including nicotine replacement therapy, non-nicotine medications, support groups, Quit Smart classes, and behavior modification. We discussed that often times setting smaller, more achievable goals, such as eliminating 1 cigarette a day for a week and then 2 cigarettes a day for a week can be helpful in slowly decreasing the number of cigarettes smoked. This allows for a sense of accomplishment as well as providing a clinical benefit. I gave him the " Be Stronger Than Your Excuses" card with contact information for community resources, classes, free nicotine replacement therapy, and access to mobile apps, text messaging, and  on-line smoking cessation help. I have also given him my card and contact information in the event he needs to contact me. We discussed the time and location of the scan, and that either Abigail Miyamoto RN or I will call with the results within 24-48 hours of receiving them. I have offered him  a copy of the power point we viewed  as a resource in the event they need reinforcement of the concepts we discussed today in the office. The patient verbalized understanding of all of  the above and had no further questions upon leaving the office. They have my contact information in the event they have any further questions.  I spent 3 minutes counseling on smoking cessation and the health risks of continued tobacco abuse.  I explained to the patient that there has been a high incidence of coronary artery disease noted on these exams. I explained that this is a non-gated exam therefore degree or severity cannot be determined. This patient is currently on statin therapy. I have asked the patient to follow-up with their PCP regarding any incidental finding of coronary artery disease and management with diet or medication as their PCP  feels is clinically indicated. The patient verbalized understanding of the above and had no further questions upon completion of the visit.      Bevelyn Ngo, NP 01/02/2021

## 2021-01-02 NOTE — Patient Instructions (Signed)
Thank you for participating in the [facility]. It was our pleasure to meet you today. We will call you with the results of your scan within the next few days. Your scan will be assigned a Lung RADS category score by the physicians reading the scans.  This Lung RADS score determines follow up scanning.  See below for description of categories, and follow up screening recommendations. We will be in touch to schedule your follow up screening annually or based on recommendations of our providers. We will fax a copy of your scan results to your Primary Care Physician, or the physician who referred you to the program, to ensure they have the results. Please call the office if you have any questions or concerns regarding your scanning experience or results.  Our office number is [phone]. Please speak with Denise Phelps, RN. She is our Lung Cancer Screening RN. If she is unavailable when you call, please have the office staff send her a message. She will return your call at her earliest convenience. Remember, if your scan is normal, we will scan you annually as long as you continue to meet the criteria for the program. (Age 55-77, Current smoker or smoker who has quit within the last 15 years). If you are a smoker, remember, quitting is the single most powerful action that you can take to decrease your risk of lung cancer and other pulmonary, breathing related problems. We know quitting is hard, and we are here to help.  Please let us know if there is anything we can do to help you meet your goal of quitting. If you are a former smoker, congratulations. We are proud of you! Remain smoke free! Remember you can refer friends or family members through the number above.  We will screen them to make sure they meet criteria for the program. Thank you for helping us take better care of you by participating in Lung Screening.  Lung RADS Categories:  Lung RADS 1: no nodules  or definitely non-concerning nodules.  Recommendation is for a repeat annual scan in 12 months.  Lung RADS 2:  nodules that are non-concerning in appearance and behavior with a very low likelihood of becoming an active cancer. Recommendation is for a repeat annual scan in 12 months.  Lung RADS 3: nodules that are probably non-concerning , includes nodules with a low likelihood of becoming an active cancer.  Recommendation is for a 6-month repeat screening scan. Often noted after an upper respiratory illness. We will be in touch to make sure you have no questions, and to schedule your 6-month scan.  Lung RADS 4 A: nodules with concerning findings, recommendation is most often for a follow up scan in 3 months or additional testing based on our provider's assessment of the scan. We will be in touch to make sure you have no questions and to schedule the recommended 3 month follow up scan.  Lung RADS 4 B:  indicates findings that are concerning. We will be in touch with you to schedule additional diagnostic testing based on our provider's  assessment of the scan.   

## 2021-01-03 ENCOUNTER — Ambulatory Visit
Admission: RE | Admit: 2021-01-03 | Discharge: 2021-01-03 | Disposition: A | Discharge status: Home / Self Care | Payer: 59 | Source: Ambulatory Visit | Attending: Acute Care | Admitting: Acute Care

## 2021-01-03 ENCOUNTER — Other Ambulatory Visit: Payer: Self-pay

## 2021-01-03 DIAGNOSIS — F17210 Nicotine dependence, cigarettes, uncomplicated: Secondary | ICD-10-CM

## 2021-01-03 DIAGNOSIS — Z87891 Personal history of nicotine dependence: Secondary | ICD-10-CM

## 2021-01-10 ENCOUNTER — Other Ambulatory Visit: Payer: Self-pay | Admitting: Acute Care

## 2021-01-10 DIAGNOSIS — F17210 Nicotine dependence, cigarettes, uncomplicated: Secondary | ICD-10-CM

## 2021-02-06 ENCOUNTER — Ambulatory Visit: Payer: 59 | Admitting: Internal Medicine

## 2021-03-14 ENCOUNTER — Other Ambulatory Visit: Payer: Self-pay | Admitting: Internal Medicine

## 2021-03-14 ENCOUNTER — Telehealth: Payer: Self-pay | Admitting: *Deleted

## 2021-03-14 NOTE — Telephone Encounter (Signed)
PFT on 12/29. Can the patient get covid test today?  I spoke with patient, he states he went to the testing center on 12/26 for covid testing and it was closed.  I called the testing center and was advised that he could be tested today and results would be ready prior to 12/29 as the results only take 12-24 hours.  I advised patient of this information.  He inquired about the hours of operation and I told him they were open from 8 am-3pm and provided the phone # to patient as requested.  Nothing further needed.

## 2021-03-16 ENCOUNTER — Ambulatory Visit (INDEPENDENT_AMBULATORY_CARE_PROVIDER_SITE_OTHER): Payer: 59 | Admitting: Internal Medicine

## 2021-03-16 ENCOUNTER — Other Ambulatory Visit: Payer: Self-pay

## 2021-03-16 DIAGNOSIS — F17210 Nicotine dependence, cigarettes, uncomplicated: Secondary | ICD-10-CM

## 2021-03-16 DIAGNOSIS — J449 Chronic obstructive pulmonary disease, unspecified: Secondary | ICD-10-CM

## 2021-03-16 NOTE — Progress Notes (Signed)
Full PFT completed today ? ?

## 2021-03-17 ENCOUNTER — Other Ambulatory Visit (HOSPITAL_COMMUNITY): Payer: Self-pay

## 2021-03-17 LAB — PULMONARY FUNCTION TEST
DL/VA % pred: 100 %
DL/VA: 4.38 ml/min/mmHg/L
DLCO cor % pred: 71 %
DLCO cor: 19.29 ml/min/mmHg
DLCO unc % pred: 71 %
DLCO unc: 19.29 ml/min/mmHg
FEF 25-75 Post: 0.56 L/sec
FEF 25-75 Pre: 2.5 L/sec
FEF2575-%Change-Post: -77 %
FEF2575-%Pred-Post: 20 %
FEF2575-%Pred-Pre: 93 %
FEV1-%Change-Post: -47 %
FEV1-%Pred-Post: 43 %
FEV1-%Pred-Pre: 82 %
FEV1-Post: 1.29 L
FEV1-Pre: 2.46 L
FEV1FVC-%Change-Post: -35 %
FEV1FVC-%Pred-Pre: 102 %
FEV6-%Change-Post: -19 %
FEV6-Post: 2.45 L
FEV6-Pre: 3.03 L
FEV6FVC-%Change-Post: 0 %
FVC-%Change-Post: -19 %
FVC-%Pred-Post: 64 %
FVC-%Pred-Pre: 80 %
FVC-Post: 2.45 L
FVC-Pre: 3.03 L
Post FEV1/FVC ratio: 53 %
Post FEV6/FVC ratio: 100 %
Pre FEV1/FVC ratio: 81 %
Pre FEV6/FVC Ratio: 100 %
RV % pred: 128 %
RV: 2.54 L
TLC % pred: 88 %
TLC: 5.46 L

## 2021-03-17 LAB — SARS CORONAVIRUS 2 (TAT 6-24 HRS): SARS Coronavirus 2: NEGATIVE

## 2021-03-21 ENCOUNTER — Other Ambulatory Visit: Payer: Self-pay

## 2021-03-21 ENCOUNTER — Ambulatory Visit (INDEPENDENT_AMBULATORY_CARE_PROVIDER_SITE_OTHER): Payer: 59 | Admitting: Internal Medicine

## 2021-03-21 ENCOUNTER — Telehealth: Payer: Self-pay

## 2021-03-21 ENCOUNTER — Other Ambulatory Visit (HOSPITAL_COMMUNITY): Payer: Self-pay

## 2021-03-21 ENCOUNTER — Encounter: Payer: Self-pay | Admitting: Internal Medicine

## 2021-03-21 VITALS — BP 149/83 | HR 67 | Temp 98.8°F | Wt 137.0 lb

## 2021-03-21 DIAGNOSIS — B181 Chronic viral hepatitis B without delta-agent: Secondary | ICD-10-CM | POA: Diagnosis not present

## 2021-03-21 DIAGNOSIS — Z79899 Other long term (current) drug therapy: Secondary | ICD-10-CM | POA: Diagnosis not present

## 2021-03-21 DIAGNOSIS — Z789 Other specified health status: Secondary | ICD-10-CM

## 2021-03-21 NOTE — Telephone Encounter (Signed)
[facility] Patient Advocate Encounter  Patient copay for Viread with new insurance is $18.81.  Clearance Coots, CPhT Specialty Pharmacy Patient [facility] Phone: [phone] Fax:  [phone]

## 2021-03-21 NOTE — Progress Notes (Signed)
[name]: follow up for chronic hepatitis B  Patient ID: Gregory Singh, male   DOB: [birth year], 61 y.o.   MRN: [id number]  HPI 61yo M with chronic hepatitis B without hepatic coma, alcohol dependence with 2 beers per night he did do a  colonoscopy in march 2021 -- found polyps, due march 2024. Weight stable. Good appetite. Still manages numerous property  Outpatient Encounter Medications as of 03/21/2021  Medication Sig   clobetasol ointment (TEMOVATE) 0.05 % Apply bid to rash on leg liberally, do not use on face or genitals   gemfibrozil (LOPID) 600 MG tablet Take 1 tablet (600 mg total) by mouth 2 (two) times daily.   Metoprolol-Hydrochlorothiazide 50-12.5 MG TB24 Take 0.5 tablets by mouth daily.   omega-3 acid ethyl esters (LOVAZA) 1 g capsule Take 2 capsules (2 g total) by mouth daily.   tenofovir (VIREAD) 300 MG tablet Take 1 tablet (300 mg total) by mouth daily.   No facility-administered encounter medications on file as of 03/21/2021.     Patient Active Problem List   Diagnosis Date Noted   COPD likely GOLD 0/ active smoker 11/08/2020   Cigarette smoker 11/08/2020   Hepatitis B 01/19/2015   Thrombocytopenia (HCC) 01/10/2015   Tremor of both hands 01/10/2015   High triglycerides 01/10/2015   Neck pain 05/21/2011   Shoulder pain 05/21/2011   Neck pain on right side 05/15/2011     Health Maintenance Due  Topic Date Due   Pneumococcal Vaccine 64-7 Years old (1 - PCV) Never done   COLONOSCOPY (Pts 45-71yrs Insurance coverage will need to be confirmed)  Never done   Zoster Vaccines- Shingrix (1 of 2) Never done   INFLUENZA VACCINE  10/17/2020     Review of Systems Review of Systems  Constitutional: Negative for fever, chills, diaphoresis, activity change, appetite change, fatigue and unexpected weight change.  HENT: Negative for congestion, sore throat, rhinorrhea, sneezing, trouble swallowing and sinus pressure.  Eyes: Negative for photophobia and visual disturbance.   Respiratory: Negative for cough, chest tightness, shortness of breath, wheezing and stridor.  Cardiovascular: Negative for chest pain, palpitations and leg swelling.  Gastrointestinal: Negative for nausea, vomiting, abdominal pain, diarrhea, constipation, blood in stool, abdominal distention and anal bleeding.  Genitourinary: Negative for dysuria, hematuria, flank pain and difficulty urinating.  Musculoskeletal: Negative for myalgias, back pain, joint swelling, arthralgias and gait problem.  Skin: Negative for color change, pallor, rash and wound.  Neurological: Negative for dizziness, tremors, weakness and light-headedness.  Hematological: Negative for adenopathy. Does not bruise/bleed easily.  Psychiatric/Behavioral: Negative for behavioral problems, confusion, sleep disturbance, dysphoric mood, decreased concentration and agitation.   Physical Exam   BP (!) 149/83   Pulse 67   Temp 98.8 F (37.1 C) (Oral)   Wt 137 lb (62.1 kg)   SpO2 97%   BMI 22.11 kg/m   Physical Exam  Constitutional: He is oriented to person, place, and time. He appears well-developed and well-nourished. No distress.  HENT:  Mouth/Throat: Oropharynx is clear and moist. No oropharyngeal exudate.  Cardiovascular: Normal rate, regular rhythm and normal heart sounds. Exam reveals no gallop and no friction rub.  No murmur heard.  Pulmonary/Chest: Effort normal and breath sounds normal. No respiratory distress. He has no wheezes.  Abdominal: Soft. Bowel sounds are normal. He exhibits no distension. There is no tenderness.  Lymphadenopathy:  He has no cervical adenopathy.  Neurological: He is alert and oriented to person, place, and time.  Skin: Skin is warm and dry.  No rash noted. No erythema.  Psychiatric: He has a normal mood and affect. His behavior is normal.   Lab Results  Component Value Date   HEPBSAB NON-REACTIVE 11/26/2018   No results found for: RPR, LABRPR  CBC Lab Results  Component Value Date    WBC 4.5 10/19/2020   RBC 4.44 10/19/2020   HGB 16.0 10/19/2020   HCT 46.6 10/19/2020   PLT 57.0 (L) 10/19/2020   MCV 105.0 (H) 10/19/2020   MCH 35.1 (H) 08/05/2020   MCHC 34.4 10/19/2020   RDW 14.2 10/19/2020   LYMPHSABS 1,732 11/26/2018   MONOABS 477 04/24/2016   EOSABS 132 11/26/2018    BMET Lab Results  Component Value Date   NA 136 10/19/2020   K 3.9 10/19/2020   CL 100 10/19/2020   CO2 23 10/19/2020   GLUCOSE 92 10/19/2020   BUN 9 10/19/2020   CREATININE 0.87 10/19/2020   CALCIUM 9.0 10/19/2020   GFRNONAA 99 11/26/2018   GFRAA 115 11/26/2018      Assessment and Plan  Chronic hepatitis b = will check labs to see that VL is undetectable. Continue with giving refills for viread. Also plan for U/S for [facility] surveillance  Long term medication management = will check LFT  Alcohol dependence = still recommend to cut down or switch to NA beer.

## 2021-03-24 LAB — COMPREHENSIVE METABOLIC PANEL
AG Ratio: 1 (calc) (ref 1.0–2.5)
ALT: 59 U/L — ABNORMAL HIGH (ref 9–46)
AST: 77 U/L — ABNORMAL HIGH (ref 10–35)
Albumin: 3.7 g/dL (ref 3.6–5.1)
Alkaline phosphatase (APISO): 156 U/L — ABNORMAL HIGH (ref 35–144)
BUN: 10 mg/dL (ref 7–25)
CO2: 29 mmol/L (ref 20–32)
Calcium: 9.4 mg/dL (ref 8.6–10.3)
Chloride: 103 mmol/L (ref 98–110)
Creat: 0.9 mg/dL (ref 0.70–1.30)
Globulin: 3.6 g/dL (calc) (ref 1.9–3.7)
Glucose, Bld: 153 mg/dL — ABNORMAL HIGH (ref 65–99)
Potassium: 3.5 mmol/L (ref 3.5–5.3)
Sodium: 140 mmol/L (ref 135–146)
Total Bilirubin: 0.7 mg/dL (ref 0.2–1.2)
Total Protein: 7.3 g/dL (ref 6.1–8.1)

## 2021-03-24 LAB — HEPATITIS B DNA, ULTRAQUANTITATIVE, PCR
Hepatitis B DNA (Calc): <1 Log IU/mL
Hepatitis B DNA: <10 IU/mL

## 2021-03-24 LAB — HEPATITIS B SURFACE ANTIBODY,QUALITATIVE: Hep B S Ab: NONREACTIVE

## 2021-03-24 LAB — HEPATITIS B E ANTIBODY: Hep B E Ab: REACTIVE — AB

## 2021-03-30 ENCOUNTER — Telehealth: Payer: Self-pay | Admitting: Family Medicine

## 2021-03-30 DIAGNOSIS — I10 Essential (primary) hypertension: Secondary | ICD-10-CM

## 2021-03-30 MED ORDER — METOPROLOL-HCTZ ER 50-12.5 MG PO TB24: 0.5000 | ORAL_TABLET | Freq: Every day | ORAL | 3 refills | Status: DC

## 2021-03-30 NOTE — Telephone Encounter (Signed)
Pt requesting BP medicine.

## 2021-03-30 NOTE — Telephone Encounter (Signed)
Refill sent in, patient aware 

## 2021-04-12 ENCOUNTER — Ambulatory Visit: Payer: 59 | Admitting: Internal Medicine

## 2021-04-20 MED ORDER — HYDROCHLOROTHIAZIDE 25 MG PO TABS: 25.0000 mg | ORAL_TABLET | Freq: Every day | ORAL | 0 refills | Status: DC

## 2021-04-20 MED ORDER — METOPROLOL SUCCINATE ER 50 MG PO TB24: 50.0000 mg | ORAL_TABLET | Freq: Every day | ORAL | 0 refills | Status: DC

## 2021-04-20 NOTE — Addendum Note (Signed)
Addended by: Andrez Grime on: 04/20/2021 01:39 PM   Modules accepted: Orders

## 2021-04-20 NOTE — Telephone Encounter (Signed)
Patient aware of message below and will pick up Rx 

## 2021-04-20 NOTE — Telephone Encounter (Signed)
Pt is stating his insurance(Friday) is not going to pay at all for his Metoprolol-Hydrochlorothiazide 50-12.5 MG TB24 [id number]. He is wondering if there is another med similar to this one that Friday will pay for. He is completely out.  Pharmacy  [id number]  [place], [place] [address]  Phone:  [phone]  Fax:  [phone]  DEA #:  [id number]

## 2021-04-20 NOTE — Telephone Encounter (Signed)
Please advice message below patient would like to know if Metoprolol/HCTZ could be changed to something different insurance will not cover.

## 2021-05-15 ENCOUNTER — Other Ambulatory Visit: Payer: Self-pay

## 2021-05-15 ENCOUNTER — Ambulatory Visit (INDEPENDENT_AMBULATORY_CARE_PROVIDER_SITE_OTHER): Payer: 59 | Admitting: Internal Medicine

## 2021-05-15 ENCOUNTER — Encounter: Payer: Self-pay | Admitting: Internal Medicine

## 2021-05-15 DIAGNOSIS — F17210 Nicotine dependence, cigarettes, uncomplicated: Secondary | ICD-10-CM | POA: Diagnosis not present

## 2021-05-15 DIAGNOSIS — J449 Chronic obstructive pulmonary disease, unspecified: Secondary | ICD-10-CM | POA: Diagnosis not present

## 2021-05-15 NOTE — Assessment & Plan Note (Signed)
Active smoker / asymtomatic though not aerobically active  - LDSCTChest 01/03/21  Mild paraseptal and centrilobular emphysema. Areas of varicoid bronchiectasis noted within the lower lobes bilaterally -  11/08/2020   [facility] RA  2 laps @ approx 232ft each @ nl pace  stopped due to end of study  no sob with sats 96%  - PFT's  03/16/21   WNL prior to albuterol rx   > 3 min discussion I reviewed the Fletcher curve with the patient that basically indicates  if you quit smoking when your best day FEV1 is still well preserved (as is clearly  the case here)  it is highly unlikely you will progress to severe disease and informed the patient there was  no medication on the market that has proven to alter the curve/ its downward trajectory  or the likelihood of progression of their disease(unlike other chronic medical conditions such as atheroclerosis where we do think we can change the natural hx with risk reducing meds)    Therefore stopping smoking and maintaining abstinence are  the most important aspects of his care, not choice of inhalers or for that matter, pulmonary doctors.   Treatment other than smoking cessation  is entirely directed by severity of symptoms and focused also on reducing exacerbations, not attempting to change the natural history of the disease.

## 2021-05-15 NOTE — Assessment & Plan Note (Signed)
Referred for LDSCT  11/08/20 and initial study done 01/03/21   Counseled re importance of smoking cessation but did not meet time criteria for separate billing    F/u per protocol planned          Each maintenance medication was reviewed in detail including emphasizing most importantly the difference between maintenance and prns and under what circumstances the prns are to be triggered using an action plan format where appropriate.  Total time for H and P, chart review, counseling,  and generating customized AVS unique to this office visit / same day charting = 26 min

## 2021-05-15 NOTE — Patient Instructions (Signed)
The key is to stop smoking completely before smoking completely stops you- it's not too late  Pulmonary follow up is as needed  

## 2021-05-15 NOTE — Progress Notes (Signed)
Gregory Singh, male    DOB: 19-Jul-1960,    MRN: [id number]   Brief patient profile:  61 yo [place] male active smoker  referred to pulmonary clinic 11/08/2020 by Dr   Alfonso Ramus for asthma/copd eval.      History of Present Illness  11/08/2020  Pulmonary/ 1st office eval/Nalin Mazzocco  Chief Complaint  Patient presents with   Consult    Patient reports he has no concerns.  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   Sleeping  without nocturnal  or early am exacerbation  of respiratory  c/o's or need for noct saba. Also denies any obvious fluctuation of symptoms with weather or environmental changes or other aggravating or alleviating factors except as outlined above   No unusual exposure hx or h/o childhood pna/ asthma or knowledge of premature birth.  Current Allergies, Complete Past Medical History, Past Surgical History, Family History, and Social History were reviewed in [employer] record.  ROS  The following are not active complaints unless bolded Hoarseness, sore throat, dysphagia, dental problems, itching, sneezing,  nasal congestion or discharge of excess mucus or purulent secretions, ear ache,   fever, chills, sweats, unintended wt loss or wt gain,  classically pleuritic or exertional cp,  orthopnea pnd or arm/hand swelling  or leg swelling, presyncope, palpitations, abdominal pain, anorexia, nausea, vomiting, diarrhea  or change in bowel habits or change in bladder habits, change in stools or change in urine, dysuria, hematuria,  rash, arthralgias, visual complaints, headache, numbness, weakness or ataxia or problems with walking or coordination,  change in mood or  memory.        Current Meds  Medication Sig   clobetasol ointment (TEMOVATE) 0.05 % Apply bid to rash on leg liberally, do not use on face or genitals   gemfibrozil (LOPID) 600 MG tablet Take 1 tablet (600 mg total) by mouth 2 (two) times daily.   hydrochlorothiazide (HYDRODIURIL) 25 MG tablet Take 1 tablet (25 mg total) by mouth daily.   metoprolol succinate (TOPROL-XL) 50 MG 24 hr tablet Take 1 tablet (50 mg total) by mouth daily. Take with or immediately following a meal.   omega-3 acid ethyl esters (LOVAZA) 1 g capsule Take 2 capsules (2 g total) by mouth daily.   tenofovir (VIREAD) 300 MG tablet Take 1 tablet (300 mg total) by mouth daily.         Past Medical History:  Diagnosis Date   Hypertension         Objective:       Wt Readings from Last 3 Encounters:  05/15/21 135 lb 12.8  oz (61.6 kg)  03/21/21 137 lb (62.1 kg)  11/08/20 133 lb 3.2 oz (60.4 kg)      Vital signs reviewed  05/15/2021  - Note at rest 02 sats  94% on RA   General appearance:    amb vietnamese male nad    HEENT : pt wearing mask not removed for exam due to covid - 19 concerns.   NECK :  without JVD/Nodes/TM/ nl carotid upstrokes bilaterally   LUNGS: no acc muscle use,  Min barrel  contour chest wall with bilateral  slightly decreased bs s audible wheeze and  without cough on insp or exp maneuvers and min  Hyperresonant  to  percussion bilaterally     CV:  RRR  no s3 or murmur or increase in P2, and no edema   ABD:  soft and nontender with pos end  insp Hoover's  in the supine  position. No bruits or organomegaly appreciated, bowel sounds nl  MS:   Nl gait/  ext warm without deformities, calf tenderness, cyanosis or clubbing No obvious joint restrictions   SKIN: warm and dry without lesions    NEURO:  alert, approp, nl sensorium with  no motor or cerebellar deficits apparent.            I personally reviewed images and agree with radiology impression as follows:   Chest LDSCT  01/03/21  Mild paraseptal and centrilobular emphysema. Areas of varicoid bronchiectasis noted within the lower lobes bilaterally. No pleural effusion, airspace consolidation, or pneumothorax. There is a single, tiny perifissural nodule along the major fissure has a mean derived diameter of 1.6 mm. Assessment

## 2021-06-09 NOTE — Telephone Encounter (Signed)
Error

## 2021-07-15 ENCOUNTER — Other Ambulatory Visit: Payer: Self-pay | Admitting: Family Medicine

## 2021-07-15 DIAGNOSIS — I10 Essential (primary) hypertension: Secondary | ICD-10-CM

## 2021-07-19 ENCOUNTER — Ambulatory Visit (HOSPITAL_COMMUNITY)
Admission: RE | Admit: 2021-07-19 | Discharge: 2021-07-19 | Disposition: A | Discharge status: Home / Self Care | Payer: 59 | Source: Ambulatory Visit | Attending: Internal Medicine | Admitting: Internal Medicine

## 2021-07-19 DIAGNOSIS — B181 Chronic viral hepatitis B without delta-agent: Secondary | ICD-10-CM | POA: Diagnosis not present

## 2021-08-22 ENCOUNTER — Other Ambulatory Visit: Payer: Self-pay | Admitting: Internal Medicine

## 2021-08-22 DIAGNOSIS — B181 Chronic viral hepatitis B without delta-agent: Secondary | ICD-10-CM

## 2021-09-14 ENCOUNTER — Telehealth: Payer: Self-pay | Admitting: Family Medicine

## 2021-09-14 ENCOUNTER — Other Ambulatory Visit: Payer: Self-pay | Admitting: Family Medicine

## 2021-09-14 DIAGNOSIS — Z0000 Encounter for general adult medical examination without abnormal findings: Secondary | ICD-10-CM

## 2021-09-14 MED ORDER — GEMFIBROZIL 600 MG PO TABS
600.0000 mg | ORAL_TABLET | Freq: Two times a day (BID) | ORAL | 0 refills | Status: DC
Start: 2021-09-14 — End: 2021-09-26

## 2021-09-14 NOTE — Telephone Encounter (Signed)
Caller Name: pt Call back phone #: [phone]  MEDICATION(S): gemfibrozil (LOPID) 600 MG tablet [[id number]]    Days of Med Remaining: 1 pill left  Has the patient contacted their pharmacy (YES/NO)?  Yes, contact your PCP I  Preferred Pharmacy: [id number] [place], [place] - [address] AT [facility] [address] [facility] [address]  [address] [place], [place] [address]  Phone:  [phone]  Fax:  [phone]  DEA #:  [id number]  ~~~Please advise patient/caregiver to allow 2-3 business days to process RX refills.

## 2021-09-14 NOTE — Telephone Encounter (Signed)
Refill sent in, note to pharmacy for patient to call to scheduled follow up appointment.

## 2021-09-18 NOTE — Telephone Encounter (Signed)
Apppointment scheduled for 09/26/21 @ 8:00 am.  Dm/cma

## 2021-09-19 ENCOUNTER — Other Ambulatory Visit: Payer: Self-pay | Admitting: Internal Medicine

## 2021-09-19 DIAGNOSIS — B181 Chronic viral hepatitis B without delta-agent: Secondary | ICD-10-CM

## 2021-09-21 ENCOUNTER — Ambulatory Visit (INDEPENDENT_AMBULATORY_CARE_PROVIDER_SITE_OTHER): Payer: 59 | Admitting: Internal Medicine

## 2021-09-21 ENCOUNTER — Encounter: Payer: Self-pay | Admitting: Internal Medicine

## 2021-09-21 ENCOUNTER — Other Ambulatory Visit: Payer: Self-pay

## 2021-09-21 VITALS — BP 122/75 | HR 64 | Temp 98.3°F

## 2021-09-21 DIAGNOSIS — Z789 Other specified health status: Secondary | ICD-10-CM | POA: Diagnosis not present

## 2021-09-21 DIAGNOSIS — Z79899 Other long term (current) drug therapy: Secondary | ICD-10-CM

## 2021-09-21 DIAGNOSIS — B181 Chronic viral hepatitis B without delta-agent: Secondary | ICD-10-CM

## 2021-09-21 MED ORDER — TENOFOVIR DISOPROXIL FUMARATE 300 MG PO TABS: 300.0000 mg | ORAL_TABLET | Freq: Every day | ORAL | 11 refills | Status: DC

## 2021-09-21 NOTE — Progress Notes (Signed)
[name]: follow up or chronic hep b  Patient ID: Gregory Singh, male   DOB: [birth year], 61 y.o.   MRN: [id number]  HPI  Lorik is a 61yo M with chronic hep b on tenofovir daily. He reports doing well overall. No health issues since we last saw him.  Sochx: still drinks 2-3 beers per night (Bud light) Outpatient Encounter Medications as of 09/21/2021  Medication Sig   clobetasol ointment (TEMOVATE) 0.05 % Apply bid to rash on leg liberally, do not use on face or genitals   gemfibrozil (LOPID) 600 MG tablet Take 1 tablet (600 mg total) by mouth 2 (two) times daily.   hydrochlorothiazide (HYDRODIURIL) 25 MG tablet TAKE 1 TABLET(25 MG) BY MOUTH DAILY   metoprolol succinate (TOPROL-XL) 50 MG 24 hr tablet TAKE 1 TABLET(50 MG) BY MOUTH DAILY WITH OR IMMEDIATELY FOLLOWING A MEAL   omega-3 acid ethyl esters (LOVAZA) 1 g capsule Take 2 capsules (2 g total) by mouth daily.   tenofovir (VIREAD) 300 MG tablet TAKE 1 TABLET BY MOUTH ONCE A DAY   No facility-administered encounter medications on file as of 09/21/2021.     Patient Active Problem List   Diagnosis Date Noted   COPD  GOLD 0/ active smoker 11/08/2020   Cigarette smoker 11/08/2020   Hepatitis B 01/19/2015   Thrombocytopenia (HCC) 01/10/2015   Tremor of both hands 01/10/2015   High triglycerides 01/10/2015   Neck pain 05/21/2011   Shoulder pain 05/21/2011   Neck pain on right side 05/15/2011     Health Maintenance Due  Topic Date Due   COLONOSCOPY (Pts 45-71yrs Insurance coverage will need to be confirmed)  Never done   Zoster Vaccines- Shingrix (1 of 2) Never done     Review of Systems  Constitutional: Negative for fever, chills, diaphoresis, activity change, appetite change, fatigue and unexpected weight change.  HENT: Negative for congestion, sore throat, rhinorrhea, sneezing, trouble swallowing and sinus pressure.  Eyes: Negative for photophobia and visual disturbance.  Respiratory: Negative for cough, chest tightness, shortness of  breath, wheezing and stridor.  Cardiovascular: Negative for chest pain, palpitations and leg swelling.  Gastrointestinal: Negative for nausea, vomiting, abdominal pain, diarrhea, constipation, blood in stool, abdominal distention and anal bleeding.  Genitourinary: Negative for dysuria, hematuria, flank pain and difficulty urinating.  Musculoskeletal: Negative for myalgias, back pain, joint swelling, arthralgias and gait problem.  Skin: Negative for color change, pallor, rash and wound.  Neurological: Negative for dizziness, tremors, weakness and light-headedness.  Hematological: Negative for adenopathy. Does not bruise/bleed easily.  Psychiatric/Behavioral: Negative for behavioral problems, confusion, sleep disturbance, dysphoric mood, decreased concentration and agitation.   Physical Exam   Gen = a  xo by3 in nad Heent = PERRLA eomi, no sclearal icterus Pulm= ctab no w/c/r Cors= nl s1,s2, no g/m/r Abd= no HSM, no abd tenderness Ext= no cc/e Lab Results  Component Value Date   HEPBSAB NON-REACTIVE 03/21/2021   No results found for: "RPR", "LABRPR"  CBC Lab Results  Component Value Date   WBC 4.5 10/19/2020   RBC 4.44 10/19/2020   HGB 16.0 10/19/2020   HCT 46.6 10/19/2020   PLT 57.0 (L) 10/19/2020   MCV 105.0 (H) 10/19/2020   MCH 35.1 (H) 08/05/2020   MCHC 34.4 10/19/2020   RDW 14.2 10/19/2020   LYMPHSABS 1,732 11/26/2018   MONOABS 477 04/24/2016   EOSABS 132 11/26/2018    BMET Lab Results  Component Value Date   NA 140 03/21/2021   K 3.5 03/21/2021  CL 103 03/21/2021   CO2 29 03/21/2021   GLUCOSE 153 (H) 03/21/2021   BUN 10 03/21/2021   CREATININE 0.90 03/21/2021   CALCIUM 9.4 03/21/2021   GFRNONAA 99 11/26/2018   GFRAA 115 11/26/2018      Assessment and Plan Chronic hepatitis b = will plan to continue on tenofovir and check Hep b labs  Alcohol use = will recommend to do trail of  Na beer rec  Long term medication management =will check LFT and cr  function Rtc 6 mo

## 2021-09-23 LAB — COMPREHENSIVE METABOLIC PANEL
AG Ratio: 1.3 (calc) (ref 1.0–2.5)
ALT: 58 U/L — ABNORMAL HIGH (ref 9–46)
AST: 84 U/L — ABNORMAL HIGH (ref 10–35)
Albumin: 3.8 g/dL (ref 3.6–5.1)
Alkaline phosphatase (APISO): 134 U/L (ref 35–144)
BUN: 12 mg/dL (ref 7–25)
CO2: 31 mmol/L (ref 20–32)
Calcium: 9.3 mg/dL (ref 8.6–10.3)
Chloride: 99 mmol/L (ref 98–110)
Creat: 0.97 mg/dL (ref 0.70–1.30)
Globulin: 2.9 g/dL (calc) (ref 1.9–3.7)
Glucose, Bld: 183 mg/dL — ABNORMAL HIGH (ref 65–99)
Potassium: 3.1 mmol/L — ABNORMAL LOW (ref 3.5–5.3)
Sodium: 140 mmol/L (ref 135–146)
Total Bilirubin: 1.1 mg/dL (ref 0.2–1.2)
Total Protein: 6.7 g/dL (ref 6.1–8.1)

## 2021-09-23 LAB — HEPATITIS B DNA, ULTRAQUANTITATIVE, PCR
Hepatitis B DNA: <10 IU/mL — ABNORMAL HIGH
Hepatitis B virus DNA: <1 Log IU/mL — ABNORMAL HIGH

## 2021-09-23 LAB — HEPATITIS B SURFACE ANTIBODY,QUALITATIVE: Hep B S Ab: NONREACTIVE

## 2021-09-26 ENCOUNTER — Encounter: Payer: Self-pay | Admitting: Family Medicine

## 2021-09-26 ENCOUNTER — Ambulatory Visit (INDEPENDENT_AMBULATORY_CARE_PROVIDER_SITE_OTHER): Payer: 59 | Admitting: Family Medicine

## 2021-09-26 ENCOUNTER — Telehealth: Payer: Self-pay

## 2021-09-26 VITALS — BP 106/68 | HR 68 | Temp 98.0°F | Ht 66.0 in | Wt 129.8 lb

## 2021-09-26 DIAGNOSIS — E781 Pure hyperglyceridemia: Secondary | ICD-10-CM | POA: Diagnosis not present

## 2021-09-26 DIAGNOSIS — Z789 Other specified health status: Secondary | ICD-10-CM

## 2021-09-26 DIAGNOSIS — Z0000 Encounter for general adult medical examination without abnormal findings: Secondary | ICD-10-CM | POA: Diagnosis not present

## 2021-09-26 DIAGNOSIS — F1090 Alcohol use, unspecified, uncomplicated: Secondary | ICD-10-CM

## 2021-09-26 DIAGNOSIS — I10 Essential (primary) hypertension: Secondary | ICD-10-CM

## 2021-09-26 DIAGNOSIS — Z23 Encounter for immunization: Secondary | ICD-10-CM | POA: Diagnosis not present

## 2021-09-26 DIAGNOSIS — N5201 Erectile dysfunction due to arterial insufficiency: Secondary | ICD-10-CM | POA: Diagnosis not present

## 2021-09-26 DIAGNOSIS — Z8619 Personal history of other infectious and parasitic diseases: Secondary | ICD-10-CM

## 2021-09-26 DIAGNOSIS — R7309 Other abnormal glucose: Secondary | ICD-10-CM | POA: Diagnosis not present

## 2021-09-26 DIAGNOSIS — E876 Hypokalemia: Secondary | ICD-10-CM

## 2021-09-26 DIAGNOSIS — F17210 Nicotine dependence, cigarettes, uncomplicated: Secondary | ICD-10-CM | POA: Diagnosis not present

## 2021-09-26 DIAGNOSIS — R7989 Other specified abnormal findings of blood chemistry: Secondary | ICD-10-CM

## 2021-09-26 DIAGNOSIS — Z125 Encounter for screening for malignant neoplasm of prostate: Secondary | ICD-10-CM | POA: Diagnosis not present

## 2021-09-26 LAB — LIPID PANEL
Cholesterol: 149 mg/dL (ref 0–200)
HDL: 31.2 mg/dL — ABNORMAL LOW (ref >39.00)
NonHDL: 117.92
Total CHOL/HDL Ratio: 5
Triglycerides: 215 mg/dL — ABNORMAL HIGH (ref 0.0–149.0)
VLDL: 43 mg/dL — ABNORMAL HIGH (ref 0.0–40.0)

## 2021-09-26 LAB — COMPREHENSIVE METABOLIC PANEL
ALT: 65 U/L — ABNORMAL HIGH (ref 0–53)
AST: 109 U/L — ABNORMAL HIGH (ref 0–37)
Albumin: 4 g/dL (ref 3.5–5.2)
Alkaline Phosphatase: 125 U/L — ABNORMAL HIGH (ref 39–117)
BUN: 10 mg/dL (ref 6–23)
CO2: 27 mEq/L (ref 19–32)
Calcium: 9.3 mg/dL (ref 8.4–10.5)
Chloride: 101 mEq/L (ref 96–112)
Creatinine, Ser: 0.81 mg/dL (ref 0.40–1.50)
GFR: 96.79 mL/min (ref >60.00)
Glucose, Bld: 107 mg/dL — ABNORMAL HIGH (ref 70–99)
Potassium: 3.3 mEq/L — ABNORMAL LOW (ref 3.5–5.1)
Sodium: 140 mEq/L (ref 135–145)
Total Bilirubin: 1.3 mg/dL — ABNORMAL HIGH (ref 0.2–1.2)
Total Protein: 7.2 g/dL (ref 6.0–8.3)

## 2021-09-26 LAB — LDL CHOLESTEROL, DIRECT: Direct LDL: 91 mg/dL

## 2021-09-26 LAB — URINALYSIS, ROUTINE W REFLEX MICROSCOPIC
Hgb urine dipstick: NEGATIVE
Leukocytes,Ua: NEGATIVE
Nitrite: NEGATIVE
RBC / HPF: NONE SEEN (ref 0–?)
Specific Gravity, Urine: 1.02 (ref 1.000–1.030)
Urine Glucose: NEGATIVE
Urobilinogen, UA: 1 (ref 0.0–1.0)
pH: 6 (ref 5.0–8.0)

## 2021-09-26 LAB — CBC
HCT: 53.6 % — ABNORMAL HIGH (ref 39.0–52.0)
Hemoglobin: 18.2 g/dL (ref 13.0–17.0)
MCHC: 33.9 g/dL (ref 30.0–36.0)
MCV: 106.3 fl — ABNORMAL HIGH (ref 78.0–100.0)
Platelets: 79 10*3/uL — ABNORMAL LOW (ref 150.0–400.0)
RBC: 5.04 Mil/uL (ref 4.22–5.81)
RDW: 14.7 % (ref 11.5–15.5)
WBC: 5.3 10*3/uL (ref 4.0–10.5)

## 2021-09-26 LAB — MICROALBUMIN / CREATININE URINE RATIO
Creatinine,U: 185.6 mg/dL
Microalb Creat Ratio: 0.7 mg/g (ref 0.0–30.0)
Microalb, Ur: 1.3 mg/dL (ref 0.0–1.9)

## 2021-09-26 LAB — HEMOGLOBIN A1C: Hgb A1c MFr Bld: 5.9 % (ref 4.6–6.5)

## 2021-09-26 LAB — PSA: PSA: 0.49 ng/mL (ref 0.10–4.00)

## 2021-09-26 MED ORDER — GEMFIBROZIL 600 MG PO TABS: 600.0000 mg | ORAL_TABLET | Freq: Two times a day (BID) | ORAL | 0 refills | Status: DC

## 2021-09-26 MED ORDER — SILDENAFIL CITRATE 20 MG PO TABS: ORAL_TABLET | ORAL | 1 refills | Status: DC

## 2021-09-26 MED ORDER — HYDROCHLOROTHIAZIDE 25 MG PO TABS: ORAL_TABLET | ORAL | 1 refills | Status: DC

## 2021-09-26 NOTE — Progress Notes (Signed)
Established Patient Office Visit  Subjective   Patient ID: Gregory Singh, male    DOB: [birth year]  Age: 61 y.o. MRN: [id number]  Chief Complaint  Patient presents with  . Follow-up    Routine follow up on medication, discuss sildenafil and the cost. Patient fasting.     HPI follow-up of hypertension, elevated triglycerides, ED, tobacco use, alcohol use with history of hepatitis B elevated liver enzymes.  Chart review shows that hepatitis B infection has responded to treatment.  Continues metoprolol and HCTZ hypertension.  Blood pressures have been running in the 1 teens over 60s to 70s.  Patient continues to smoke 6 to 7 cigarettes daily.  He drinks 3 beers daily.  Continues with sildenafil for ED but says that it is expensive.  Accompanied by his wife today.  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  He has not seen a dentist regularly.  {History (Optional):23778}  Review of Systems  Constitutional: Negative.   HENT: Negative.    Eyes:  Negative for blurred vision, discharge and redness.  Respiratory: Negative.    Cardiovascular: Negative.   Gastrointestinal:  Negative for abdominal pain.  Genitourinary: Negative.   Musculoskeletal: Negative.  Negative for myalgias.  Skin:  Negative for rash.  Neurological:  Negative for tingling, loss of consciousness and weakness.  Endo/Heme/Allergies:  Negative for polydipsia.      Objective:     BP 106/68 (BP Location: Right Arm, Patient Position: Sitting, Cuff Size: Normal)   Pulse 68   Temp 98 F (36.7 C) (Temporal)   Ht 5\' 6"  (1.676 m)   Wt 129 lb 12.8 oz (58.9 kg)   SpO2 94%   BMI 20.95 kg/m  BP Readings from Last 3 Encounters:  09/26/21 106/68  09/21/21 122/75  05/15/21 116/62   Wt Readings from Last 3 Encounters:  09/26/21 129 lb 12.8 oz (58.9 kg)  05/15/21 135 lb 12.8 oz (61.6 kg)  03/21/21 137 lb (62.1 kg)      Physical Exam Constitutional:      General: He is not in acute distress.    Appearance: Normal  appearance. He is not ill-appearing, toxic-appearing or diaphoretic.  HENT:     Head: Normocephalic and atraumatic.     Right Ear: External ear normal.     Left Ear: External ear normal.     Mouth/Throat:     Mouth: Mucous membranes are moist.     Pharynx: Oropharynx is clear. No oropharyngeal exudate or posterior oropharyngeal erythema.  Eyes:     General: No scleral icterus.       Right eye: No discharge.        Left eye: No discharge.     Extraocular Movements: Extraocular movements intact.     Conjunctiva/sclera: Conjunctivae normal.     Pupils: Pupils are equal, round, and reactive to light.  Cardiovascular:     Rate and Rhythm: Normal rate and regular rhythm.  Pulmonary:     Effort: Pulmonary effort is normal. No respiratory distress.     Breath sounds: Normal breath sounds.  Abdominal:     General: Bowel sounds are normal.     Tenderness: There is no abdominal tenderness. There is no guarding.  Musculoskeletal:     Cervical back: No rigidity or tenderness.  Skin:    General: Skin is warm and dry.  Neurological:     Mental Status: He is alert and oriented to person, place, and time.  Psychiatric:  Mood and Affect: Mood normal.        Behavior: Behavior normal.     No results found for any visits on 09/26/21.  {Labs (Optional):23779}  The 10-year ASCVD risk score (Arnett DK, et al., 2019) is: 11.5%    Assessment & Plan:   Problem List Items Addressed This Visit       Other   High triglycerides   Relevant Medications   hydrochlorothiazide (HYDRODIURIL) 25 MG tablet   gemfibrozil (LOPID) 600 MG tablet   sildenafil (REVATIO) 20 MG tablet   Other Relevant Orders   Comprehensive metabolic panel   Lipid panel   Cigarette smoker   Other Visit Diagnoses     Healthcare maintenance    -  Primary   Relevant Medications   gemfibrozil (LOPID) 600 MG tablet   Essential hypertension       Relevant Medications   hydrochlorothiazide (HYDRODIURIL) 25 MG  tablet   gemfibrozil (LOPID) 600 MG tablet   sildenafil (REVATIO) 20 MG tablet   Other Relevant Orders   CBC   Comprehensive metabolic panel   Urinalysis, Routine w reflex microscopic   Microalbumin / creatinine urine ratio   Erectile dysfunction due to arterial insufficiency       Relevant Medications   hydrochlorothiazide (HYDRODIURIL) 25 MG tablet   gemfibrozil (LOPID) 600 MG tablet   sildenafil (REVATIO) 20 MG tablet   Alcohol use       History of hepatitis B       Relevant Orders   Comprehensive metabolic panel   Elevated LFTs       Relevant Orders   Comprehensive metabolic panel   Screening for prostate cancer       Relevant Orders   PSA   Need for shingles vaccine       Relevant Orders   Varicella-zoster vaccine IM (Completed)   Elevated glucose       Relevant Orders   Comprehensive metabolic panel   Hemoglobin A1c       Return in about 3 months (around 12/27/2021).  Encouraged him to go ahead and quit smoking and to drink no more than 2 beers daily.  Advised that these things will help his ED.  We will discontinue the metoprolol and follow-up in 3 months for recheck.  Urged him to see a dentist on a regular basis.  Information was given on health maintenance and disease prevention.  First Shingrix vaccine today.  Follow-up in 3 months for recheck of blood pressure and his second Shingrix vaccine.  Mliss Sax, MD

## 2021-09-26 NOTE — Telephone Encounter (Signed)
[facility]: Clydie Braun Patient Name: Gregory Singh, Gregory Singh.O.B: [birth year] CRITICAL VALUE: hemoglobin 18.2

## 2021-09-27 ENCOUNTER — Encounter: Payer: Self-pay | Admitting: Family Medicine

## 2021-09-27 MED ORDER — POTASSIUM CHLORIDE CRYS ER 20 MEQ PO TBCR: 20.0000 meq | EXTENDED_RELEASE_TABLET | Freq: Every day | ORAL | 1 refills | Status: DC

## 2021-09-27 NOTE — Progress Notes (Signed)
1. Hgb is elevated. It is most important for him to stop smoking as advised during his visit. Please be sure to follow up in 3 months as directed.  2. Liver enzymes are elevated. It looks as though this could be associated with his alcohol intake. As discussed during the visit. Limit alcohol intake to no more than 2 12 ounce beers daily. Let me know if this is a problem.   3 Potassium is low. Have sent Rx for patient to take a potassium pill daily. Follow up in 3 months as directed.

## 2021-10-10 ENCOUNTER — Other Ambulatory Visit: Payer: Self-pay | Admitting: Family Medicine

## 2021-10-10 DIAGNOSIS — I10 Essential (primary) hypertension: Secondary | ICD-10-CM

## 2021-10-10 DIAGNOSIS — Z0000 Encounter for general adult medical examination without abnormal findings: Secondary | ICD-10-CM

## 2021-11-07 ENCOUNTER — Telehealth: Payer: Self-pay | Admitting: Family Medicine

## 2021-11-07 NOTE — Telephone Encounter (Signed)
Caller Name: Pistol Kessenich Call back phone #: [phone]  Reason for Call: Pt would like prescriptions to be sent in as 90 day supply, he says that he does not have time to go every 30 days.  [facility] #[id number] [place], [place] - [address] AT [facility] OF [address]   Phone:  [phone]  Fax:  [phone]

## 2021-11-08 NOTE — Telephone Encounter (Signed)
Patient aware 90 day supply of Gemfibrozil was sent in 09/26/21

## 2021-11-09 ENCOUNTER — Other Ambulatory Visit: Payer: Self-pay | Admitting: Family Medicine

## 2021-11-09 ENCOUNTER — Other Ambulatory Visit: Payer: Self-pay

## 2021-11-09 ENCOUNTER — Telehealth: Payer: Self-pay | Admitting: Family Medicine

## 2021-11-09 DIAGNOSIS — Z0000 Encounter for general adult medical examination without abnormal findings: Secondary | ICD-10-CM

## 2021-11-09 MED ORDER — GEMFIBROZIL 600 MG PO TABS: 600.0000 mg | ORAL_TABLET | Freq: Two times a day (BID) | ORAL | 0 refills | Status: DC

## 2021-11-09 NOTE — Telephone Encounter (Signed)
Caller Name: pharmacy  Call back phone #:   MEDICATION(S): gemfibrozil (LOPID) 600 MG tablet [[id number]]   Days of Med Remaining:   Has the patient contacted their pharmacy (YES/NO)? y What did pharmacy advise? Pt get a 30 or 90 day supply put in because pt insurance will cover it  Preferred Pharmacy:  [facility] #[id number] - [place], [place] - [address] AT [facility] OF [address] Phone:  [phone]      ~~~Please advise patient/caregiver to allow 2-3 business days to process RX refills.

## 2021-11-10 ENCOUNTER — Other Ambulatory Visit: Payer: Self-pay

## 2021-11-10 DIAGNOSIS — B181 Chronic viral hepatitis B without delta-agent: Secondary | ICD-10-CM

## 2021-11-10 MED ORDER — TENOFOVIR DISOPROXIL FUMARATE 300 MG PO TABS: 300.0000 mg | ORAL_TABLET | Freq: Every day | ORAL | 11 refills | Status: DC

## 2021-12-27 ENCOUNTER — Encounter: Payer: Self-pay | Admitting: Family Medicine

## 2021-12-27 ENCOUNTER — Ambulatory Visit (INDEPENDENT_AMBULATORY_CARE_PROVIDER_SITE_OTHER): Payer: Commercial Managed Care - HMO | Admitting: Family Medicine

## 2021-12-27 VITALS — BP 118/66 | HR 80 | Temp 98.0°F | Ht 66.0 in | Wt 125.6 lb

## 2021-12-27 DIAGNOSIS — Z23 Encounter for immunization: Secondary | ICD-10-CM | POA: Diagnosis not present

## 2021-12-27 DIAGNOSIS — Z789 Other specified health status: Secondary | ICD-10-CM | POA: Diagnosis not present

## 2021-12-27 DIAGNOSIS — Z8619 Personal history of other infectious and parasitic diseases: Secondary | ICD-10-CM

## 2021-12-27 DIAGNOSIS — N5201 Erectile dysfunction due to arterial insufficiency: Secondary | ICD-10-CM | POA: Diagnosis not present

## 2021-12-27 DIAGNOSIS — F17210 Nicotine dependence, cigarettes, uncomplicated: Secondary | ICD-10-CM | POA: Diagnosis not present

## 2021-12-27 DIAGNOSIS — R21 Rash and other nonspecific skin eruption: Secondary | ICD-10-CM

## 2021-12-27 DIAGNOSIS — I10 Essential (primary) hypertension: Secondary | ICD-10-CM | POA: Diagnosis not present

## 2021-12-27 DIAGNOSIS — E781 Pure hyperglyceridemia: Secondary | ICD-10-CM | POA: Diagnosis not present

## 2021-12-27 LAB — LIPID PANEL
Cholesterol: 151 mg/dL (ref 0–200)
HDL: 41.2 mg/dL (ref >39.00)
LDL Cholesterol: 79 mg/dL (ref 0–99)
NonHDL: 109.65
Total CHOL/HDL Ratio: 4
Triglycerides: 151 mg/dL — ABNORMAL HIGH (ref 0.0–149.0)
VLDL: 30.2 mg/dL (ref 0.0–40.0)

## 2021-12-27 LAB — COMPREHENSIVE METABOLIC PANEL
ALT: 66 U/L — ABNORMAL HIGH (ref 0–53)
AST: 95 U/L — ABNORMAL HIGH (ref 0–37)
Albumin: 4.2 g/dL (ref 3.5–5.2)
Alkaline Phosphatase: 138 U/L — ABNORMAL HIGH (ref 39–117)
BUN: 11 mg/dL (ref 6–23)
CO2: 30 mEq/L (ref 19–32)
Calcium: 9.8 mg/dL (ref 8.4–10.5)
Chloride: 99 mEq/L (ref 96–112)
Creatinine, Ser: 0.88 mg/dL (ref 0.40–1.50)
GFR: 94.23 mL/min (ref >60.00)
Glucose, Bld: 130 mg/dL — ABNORMAL HIGH (ref 70–99)
Potassium: 3.5 mEq/L (ref 3.5–5.1)
Sodium: 139 mEq/L (ref 135–145)
Total Bilirubin: 1.1 mg/dL (ref 0.2–1.2)
Total Protein: 7.7 g/dL (ref 6.0–8.3)

## 2021-12-27 LAB — CBC
HCT: 49 % (ref 39.0–52.0)
Hemoglobin: 16.7 g/dL (ref 13.0–17.0)
MCHC: 34.2 g/dL (ref 30.0–36.0)
MCV: 105.3 fl — ABNORMAL HIGH (ref 78.0–100.0)
Platelets: 80 10*3/uL — ABNORMAL LOW (ref 150.0–400.0)
RBC: 4.65 Mil/uL (ref 4.22–5.81)
RDW: 13.8 % (ref 11.5–15.5)
WBC: 4.2 10*3/uL (ref 4.0–10.5)

## 2021-12-27 MED ORDER — SILDENAFIL CITRATE 20 MG PO TABS: ORAL_TABLET | ORAL | 3 refills | Status: DC

## 2021-12-27 MED ORDER — CLOBETASOL PROPIONATE 0.05 % EX OINT: TOPICAL_OINTMENT | CUTANEOUS | 1 refills | Status: AC

## 2021-12-27 NOTE — Progress Notes (Signed)
Established Patient Office Visit  Subjective   Patient ID: Gregory Singh, male    DOB: May 17, 1960  Age: 61 y.o. MRN: [id number]  Chief Complaint  Patient presents with   Follow-up    3 month follow up, no concerns. Patient fasting.     HPI 82-month follow-up for second Shingrix, flu vaccine, elevated triglycerides, alcohol use, tobacco use ED and rash on knees.  He is using the clobetasol cream exclusively for rash on his anterior knees.  It is helping.  He understands that it is not to be used on the face or private areas.  Accompanied by his wife today.  He is seeing hepatology every 6 months and pulmonology as well.  Continues to drink 3 beers daily.  He has cut back on his smoking to half a pack every 3 days.  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  Continues to have difficulty finding a dentist.  Sildenafil has been helpful for ED.  Continues HCTZ 25 mg for controlled hypertension with supplemental potassium.    Review of Systems  Constitutional: Negative.   HENT: Negative.    Eyes:  Negative for blurred vision, discharge and redness.  Respiratory: Negative.    Cardiovascular: Negative.   Gastrointestinal:  Negative for abdominal pain.  Genitourinary: Negative.   Musculoskeletal: Negative.  Negative for myalgias.  Skin:  Negative for rash.  Neurological:  Negative for tingling, loss of consciousness and weakness.  Endo/Heme/Allergies:  Negative for polydipsia.      Objective:     BP 118/66 (BP Location: Right Arm, Patient Position: Sitting, Cuff Size: Normal)   Pulse 80   Temp 98 F (36.7 C) (Temporal)   Ht 5\' 6"  (1.676 m)   Wt 125 lb 9.6 oz (57 kg)   SpO2 96%   BMI 20.27 kg/m    Physical Exam   No results found for any visits on 12/27/21.    The 10-year ASCVD risk score (Arnett DK, et al., 2019) is: 14.1%    Assessment & Plan:   Problem List Items Addressed This Visit       Other   High triglycerides   Relevant Medications   sildenafil (REVATIO) 20 MG  tablet   Other Relevant Orders   CBC   Comprehensive metabolic panel   Lipid panel   Cigarette smoker - Primary   Relevant Orders   CBC   Other Visit Diagnoses     Erectile dysfunction due to arterial insufficiency       Relevant Medications   sildenafil (REVATIO) 20 MG tablet   Rash and nonspecific skin eruption       Relevant Medications   clobetasol ointment (TEMOVATE) 0.05 %   Alcohol use       Relevant Orders   Comprehensive metabolic panel   History of hepatitis B       Relevant Orders   Comprehensive metabolic panel   Need for shingles vaccine       Relevant Orders   Varicella-zoster vaccine IM (Completed)   Need for influenza vaccination       Relevant Orders   Flu Vaccine QUAD 6+ mos PF IM (Fluarix Quad PF) (Completed)   Essential hypertension       Relevant Medications   sildenafil (REVATIO) 20 MG tablet   Other Relevant Orders   Comprehensive metabolic panel       Return in about 6 months (around 06/28/2022).  Please continue smoking cessation efforts.  Congratulated him on decreasing that to half a pack  over 3 days.  Please decrease alcohol intake to no more than 2 beers daily.  Please continue follow-up with hepatology and pulmonology.  Second and final shingles vaccine today.  Flu shot today.  Continue search for dental care.  Refilled sildenafil.  Checking lipids.  Advised follow-up COVID-vaccine.  Mliss Sax, MD

## 2022-01-03 ENCOUNTER — Ambulatory Visit
Admission: RE | Admit: 2022-01-03 | Discharge: 2022-01-03 | Disposition: A | Discharge status: Home / Self Care | Payer: Commercial Managed Care - HMO | Source: Ambulatory Visit | Attending: Acute Care | Admitting: Acute Care

## 2022-01-03 DIAGNOSIS — F17210 Nicotine dependence, cigarettes, uncomplicated: Secondary | ICD-10-CM

## 2022-01-08 ENCOUNTER — Other Ambulatory Visit: Payer: Self-pay

## 2022-01-08 DIAGNOSIS — F17210 Nicotine dependence, cigarettes, uncomplicated: Secondary | ICD-10-CM

## 2022-01-08 DIAGNOSIS — Z87891 Personal history of nicotine dependence: Secondary | ICD-10-CM

## 2022-01-08 DIAGNOSIS — Z122 Encounter for screening for malignant neoplasm of respiratory organs: Secondary | ICD-10-CM

## 2022-02-05 ENCOUNTER — Other Ambulatory Visit: Payer: Self-pay | Admitting: Family Medicine

## 2022-02-05 DIAGNOSIS — Z0000 Encounter for general adult medical examination without abnormal findings: Secondary | ICD-10-CM

## 2022-03-09 ENCOUNTER — Other Ambulatory Visit: Payer: Self-pay | Admitting: Family Medicine

## 2022-03-09 DIAGNOSIS — I10 Essential (primary) hypertension: Secondary | ICD-10-CM

## 2022-03-20 ENCOUNTER — Other Ambulatory Visit: Payer: Self-pay | Admitting: Family Medicine

## 2022-03-20 DIAGNOSIS — E876 Hypokalemia: Secondary | ICD-10-CM

## 2022-03-22 ENCOUNTER — Other Ambulatory Visit: Payer: Self-pay | Admitting: Internal Medicine

## 2022-03-22 ENCOUNTER — Ambulatory Visit: Payer: BLUE CROSS/BLUE SHIELD | Admitting: Internal Medicine

## 2022-03-22 ENCOUNTER — Other Ambulatory Visit: Payer: Commercial Managed Care - HMO

## 2022-03-22 ENCOUNTER — Other Ambulatory Visit: Payer: Self-pay

## 2022-03-22 DIAGNOSIS — B181 Chronic viral hepatitis B without delta-agent: Secondary | ICD-10-CM

## 2022-03-22 NOTE — Addendum Note (Signed)
Addended by: Caffie Pinto on: 03/22/2022 03:17 PM   Modules accepted: Orders

## 2022-03-22 NOTE — Addendum Note (Signed)
Addended by: Caffie Pinto on: 03/22/2022 03:20 PM   Modules accepted: Orders

## 2022-03-26 LAB — CBC WITH DIFFERENTIAL/PLATELET
Absolute Monocytes: 809 cells/uL (ref 200–950)
Basophils Absolute: 61 cells/uL (ref 0–200)
Basophils Relative: 0.9 %
Eosinophils Absolute: 129 cells/uL (ref 15–500)
Eosinophils Relative: 1.9 %
HCT: 47.8 % (ref 38.5–50.0)
Hemoglobin: 17.2 g/dL — ABNORMAL HIGH (ref 13.2–17.1)
Lymphs Abs: 2366 cells/uL (ref 850–3900)
MCH: 35.5 pg — ABNORMAL HIGH (ref 27.0–33.0)
MCHC: 36 g/dL (ref 32.0–36.0)
MCV: 98.8 fL (ref 80.0–100.0)
MPV: 11.9 fL (ref 7.5–12.5)
Monocytes Relative: 11.9 %
Neutro Abs: 3434 cells/uL (ref 1500–7800)
Neutrophils Relative %: 50.5 %
Platelets: 110 10*3/uL — ABNORMAL LOW (ref 140–400)
RBC: 4.84 10*6/uL (ref 4.20–5.80)
RDW: 12.2 % (ref 11.0–15.0)
Total Lymphocyte: 34.8 %
WBC: 6.8 10*3/uL (ref 3.8–10.8)

## 2022-03-26 LAB — COMPLETE METABOLIC PANEL WITH GFR
AG Ratio: 1.3 (calc) (ref 1.0–2.5)
ALT: 42 U/L (ref 9–46)
AST: 52 U/L — ABNORMAL HIGH (ref 10–35)
Albumin: 4.3 g/dL (ref 3.6–5.1)
Alkaline phosphatase (APISO): 164 U/L — ABNORMAL HIGH (ref 35–144)
BUN: 13 mg/dL (ref 7–25)
CO2: 28 mmol/L (ref 20–32)
Calcium: 10 mg/dL (ref 8.6–10.3)
Chloride: 103 mmol/L (ref 98–110)
Creat: 0.89 mg/dL (ref 0.70–1.30)
Globulin: 3.2 g/dL (calc) (ref 1.9–3.7)
Glucose, Bld: 113 mg/dL — ABNORMAL HIGH (ref 65–99)
Potassium: 3.7 mmol/L (ref 3.5–5.3)
Sodium: 142 mmol/L (ref 135–146)
Total Bilirubin: 0.7 mg/dL (ref 0.2–1.2)
Total Protein: 7.5 g/dL (ref 6.1–8.1)
eGFR: 99 mL/min/{1.73_m2} (ref >=60)

## 2022-03-26 LAB — HEPATITIS B DNA, ULTRAQUANTITATIVE, PCR
Hepatitis B DNA: NOT DETECTED IU/mL
Hepatitis B virus DNA: NOT DETECTED Log IU/mL

## 2022-03-27 ENCOUNTER — Other Ambulatory Visit: Payer: Self-pay | Admitting: Family Medicine

## 2022-03-27 DIAGNOSIS — N5201 Erectile dysfunction due to arterial insufficiency: Secondary | ICD-10-CM

## 2022-03-28 ENCOUNTER — Ambulatory Visit
Admission: RE | Admit: 2022-03-28 | Discharge: 2022-03-28 | Disposition: A | Discharge status: Home / Self Care | Payer: Commercial Managed Care - HMO | Source: Ambulatory Visit | Attending: Internal Medicine | Admitting: Internal Medicine

## 2022-03-28 DIAGNOSIS — B181 Chronic viral hepatitis B without delta-agent: Secondary | ICD-10-CM

## 2022-04-05 ENCOUNTER — Ambulatory Visit (INDEPENDENT_AMBULATORY_CARE_PROVIDER_SITE_OTHER): Payer: Commercial Managed Care - HMO | Admitting: Internal Medicine

## 2022-04-05 ENCOUNTER — Encounter: Payer: Self-pay | Admitting: Internal Medicine

## 2022-04-05 ENCOUNTER — Other Ambulatory Visit: Payer: Self-pay

## 2022-04-05 VITALS — BP 143/89 | HR 72 | Temp 98.5°F | Ht 66.0 in | Wt 128.0 lb

## 2022-04-05 DIAGNOSIS — Z86010 Personal history of colonic polyps: Secondary | ICD-10-CM | POA: Diagnosis not present

## 2022-04-05 DIAGNOSIS — B181 Chronic viral hepatitis B without delta-agent: Secondary | ICD-10-CM

## 2022-04-05 DIAGNOSIS — Z79899 Other long term (current) drug therapy: Secondary | ICD-10-CM

## 2022-04-05 NOTE — Progress Notes (Signed)
[name]: follow up for chronic hepatitis B  Patient ID: Gregory Singh, male   DOB: 1960/06/06, 62 y.o.   MRN: [id number]  HPI Gregory Singh is a 62yo asian male with chronic hepatitis B, currently on tenofovir. Doing well with adherence. Decreasing beer drinking.  Had colonoscopy saw 3 polyps - feb 2021 -  needs repeat colonscopy this year.   Outpatient Encounter Medications as of 04/05/2022  Medication Sig   clobetasol ointment (TEMOVATE) 0.05 % Apply bid to rash on leg liberally, do not use on face or genitals   gemfibrozil (LOPID) 600 MG tablet TAKE 1 TABLET(600 MG) BY MOUTH TWICE DAILY   hydrochlorothiazide (HYDRODIURIL) 25 MG tablet TAKE 1 TABLET(25 MG) BY MOUTH DAILY   omega-3 acid ethyl esters (LOVAZA) 1 g capsule Take 2 capsules (2 g total) by mouth daily.   potassium chloride SA (KLOR-CON M) 20 MEQ tablet TAKE 1 TABLET(20 MEQ) BY MOUTH DAILY   sildenafil (REVATIO) 20 MG tablet TAKE NO MORE THAN 3-5 TABLETS BY MOUTH 45 MINUTES PRIOR AS NEEDED   tenofovir (VIREAD) 300 MG tablet Take 1 tablet (300 mg total) by mouth daily.   No facility-administered encounter medications on file as of 04/05/2022.     Patient Active Problem List   Diagnosis Date Noted   COPD  GOLD 0/ active smoker 11/08/2020   Cigarette smoker 11/08/2020   Hepatitis B 01/19/2015   Thrombocytopenia ([place]) 01/10/2015   Tremor of both hands 01/10/2015   High triglycerides 01/10/2015   Neck pain 05/21/2011   Shoulder pain 05/21/2011   Neck pain on right side 05/15/2011     There are no preventive care reminders to display for this patient.   Review of Systems Review of Systems  Constitutional: Negative for fever, chills, diaphoresis, activity change, appetite change, fatigue and unexpected weight change.  HENT: Negative for congestion, sore throat, rhinorrhea, sneezing, trouble swallowing and sinus pressure.  Eyes: Negative for photophobia and visual disturbance.  Respiratory: Negative for cough, chest tightness, shortness  of breath, wheezing and stridor.  Cardiovascular: Negative for chest pain, palpitations and leg swelling.  Gastrointestinal: Negative for nausea, vomiting, abdominal pain, diarrhea, constipation, blood in stool, abdominal distention and anal bleeding.  Genitourinary: Negative for dysuria, hematuria, flank pain and difficulty urinating.  Musculoskeletal: Negative for myalgias, back pain, joint swelling, arthralgias and gait problem.  Skin: Negative for color change, pallor, rash and wound.  Neurological: Negative for dizziness, tremors, weakness and light-headedness.  Hematological: Negative for adenopathy. Does not bruise/bleed easily.  Psychiatric/Behavioral: Negative for behavioral problems, confusion, sleep disturbance, dysphoric mood, decreased concentration and agitation.   Physical Exam   BP (!) 143/89   Pulse 72   Temp 98.5 F (36.9 C) (Temporal)   Ht 5\' 6"  (1.676 m)   Wt 128 lb (58.1 kg)   SpO2 94%   BMI 20.66 kg/m    Physical Exam  Constitutional: He is oriented to person, place, and time. He appears well-developed and well-nourished. No distress.  HENT:  Mouth/Throat: Oropharynx is clear and moist. No oropharyngeal exudate.  Cardiovascular: Normal rate, regular rhythm and normal heart sounds. Exam reveals no gallop and no friction rub.  No murmur heard.  Pulmonary/Chest: Effort normal and breath sounds normal. No respiratory distress. He has no wheezes.  Abdominal: Soft. Bowel sounds are normal. He exhibits no distension. There is no tenderness.  Lymphadenopathy:  He has no cervical adenopathy.  Neurological: He is alert and oriented to person, place, and time.  Skin: Skin is warm and dry. No  rash noted. No erythema.  Psychiatric: He has a normal mood and affect. His behavior is normal.   Lab Results  Component Value Date   HEPBSAB NON-REACTIVE 09/21/2021   No results found for: "RPR", "LABRPR"  CBC Lab Results  Component Value Date   WBC 6.8 03/22/2022   RBC  4.84 03/22/2022   HGB 17.2 (H) 03/22/2022   HCT 47.8 03/22/2022   PLT 110 (L) 03/22/2022   MCV 98.8 03/22/2022   MCH 35.5 (H) 03/22/2022   MCHC 36.0 03/22/2022   RDW 12.2 03/22/2022   LYMPHSABS 2,366 03/22/2022   MONOABS 477 04/24/2016   EOSABS 129 03/22/2022    BMET Lab Results  Component Value Date   NA 142 03/22/2022   K 3.7 03/22/2022   CL 103 03/22/2022   CO2 28 03/22/2022   GLUCOSE 113 (H) 03/22/2022   BUN 13 03/22/2022   CREATININE 0.89 03/22/2022   CALCIUM 10.0 03/22/2022   GFRNONAA 99 11/26/2018   GFRAA 115 11/26/2018      Assessment and Plan Chronic hepatitis b = reviewed labs and recent ultrasound for hcc surveillance, all looking good. Plan to Refill tenofovir and have him  Long term medication management = cr stable Return in 6 months  Hx of colonic polyps= make sure to do repeat colonoscopy this spring.

## 2022-04-26 ENCOUNTER — Other Ambulatory Visit: Payer: Self-pay | Admitting: Family Medicine

## 2022-04-26 DIAGNOSIS — N5201 Erectile dysfunction due to arterial insufficiency: Secondary | ICD-10-CM

## 2022-05-14 ENCOUNTER — Other Ambulatory Visit: Payer: Self-pay | Admitting: Family Medicine

## 2022-05-14 DIAGNOSIS — I10 Essential (primary) hypertension: Secondary | ICD-10-CM

## 2022-06-08 ENCOUNTER — Other Ambulatory Visit: Payer: Self-pay | Admitting: Family Medicine

## 2022-06-08 DIAGNOSIS — N5201 Erectile dysfunction due to arterial insufficiency: Secondary | ICD-10-CM

## 2022-06-28 ENCOUNTER — Encounter: Payer: Self-pay | Admitting: Family Medicine

## 2022-06-28 ENCOUNTER — Ambulatory Visit (INDEPENDENT_AMBULATORY_CARE_PROVIDER_SITE_OTHER): Payer: Commercial Managed Care - HMO | Admitting: Family Medicine

## 2022-06-28 VITALS — BP 122/72 | HR 74 | Temp 98.7°F | Resp 82 | Ht 66.0 in | Wt 130.4 lb

## 2022-06-28 DIAGNOSIS — F17210 Nicotine dependence, cigarettes, uncomplicated: Secondary | ICD-10-CM

## 2022-06-28 DIAGNOSIS — E876 Hypokalemia: Secondary | ICD-10-CM | POA: Diagnosis not present

## 2022-06-28 DIAGNOSIS — E781 Pure hyperglyceridemia: Secondary | ICD-10-CM | POA: Diagnosis not present

## 2022-06-28 DIAGNOSIS — N5201 Erectile dysfunction due to arterial insufficiency: Secondary | ICD-10-CM

## 2022-06-28 DIAGNOSIS — R7303 Prediabetes: Secondary | ICD-10-CM | POA: Diagnosis not present

## 2022-06-28 DIAGNOSIS — I10 Essential (primary) hypertension: Secondary | ICD-10-CM

## 2022-06-28 LAB — LIPID PANEL
Cholesterol: 151 mg/dL (ref 0–200)
HDL: 37 mg/dL — ABNORMAL LOW (ref >39.00)
LDL Cholesterol: 76 mg/dL (ref 0–99)
NonHDL: 113.81
Total CHOL/HDL Ratio: 4
Triglycerides: 190 mg/dL — ABNORMAL HIGH (ref 0.0–149.0)
VLDL: 38 mg/dL (ref 0.0–40.0)

## 2022-06-28 LAB — BASIC METABOLIC PANEL
BUN: 8 mg/dL (ref 6–23)
CO2: 28 mEq/L (ref 19–32)
Calcium: 9.4 mg/dL (ref 8.4–10.5)
Chloride: 102 mEq/L (ref 96–112)
Creatinine, Ser: 0.85 mg/dL (ref 0.40–1.50)
GFR: 94.89 mL/min (ref >60.00)
Glucose, Bld: 109 mg/dL — ABNORMAL HIGH (ref 70–99)
Potassium: 3.4 mEq/L — ABNORMAL LOW (ref 3.5–5.1)
Sodium: 141 mEq/L (ref 135–145)

## 2022-06-28 LAB — HEMOGLOBIN A1C: Hgb A1c MFr Bld: 5.6 % (ref 4.6–6.5)

## 2022-06-28 MED ORDER — SILDENAFIL CITRATE 100 MG PO TABS: 50.0000 mg | ORAL_TABLET | Freq: Every day | ORAL | 6 refills | Status: DC | PRN

## 2022-06-28 NOTE — Progress Notes (Addendum)
Established Patient Office Visit   Subjective:  Patient ID: Gregory Singh, male    DOB: Aug 30, 1960  Age: 62 y.o. MRN: [id number]  Chief Complaint  Patient presents with   6 month follow up    Concerns/ questions: nonr    HPI Encounter Diagnoses  Name Primary?   Cigarette smoker Yes   High triglycerides    Hypokalemia    Erectile dysfunction due to arterial insufficiency    Prediabetes    Essential hypertension    Follow-up of above.  Blood pressure controlled with HCTZ 25.  Down to quarter pack of cigarettes daily.  Continues with 2-3 beers daily.  Discussed prediabetes.  His father has diabetes.   ROS   Current Outpatient Medications:    clobetasol ointment (TEMOVATE) 0.05 %, Apply bid to rash on leg liberally, do not use on face or genitals, Disp: 60 g, Rfl: 1   gemfibrozil (LOPID) 600 MG tablet, TAKE 1 TABLET(600 MG) BY MOUTH TWICE DAILY, Disp: 180 tablet, Rfl: 3   hydrochlorothiazide (HYDRODIURIL) 25 MG tablet, TAKE 1 TABLET(25 MG) BY MOUTH DAILY, Disp: 90 tablet, Rfl: 1   omega-3 acid ethyl esters (LOVAZA) 1 g capsule, Take 2 capsules (2 g total) by mouth daily., Disp: 30 capsule, Rfl: 2   tenofovir (VIREAD) 300 MG tablet, Take 1 tablet (300 mg total) by mouth daily., Disp: 30 tablet, Rfl: 11   potassium chloride SA (KLOR-CON M) 20 MEQ tablet, TAKE 1 TABLET(20 MEQ) BY MOUTH DAILY, Disp: 90 tablet, Rfl: 2   sildenafil (VIAGRA) 100 MG tablet, Take 0.5-1 tablets (50-100 mg total) by mouth daily as needed for erectile dysfunction., Disp: 20 tablet, Rfl: 6   Objective:     BP 122/72 (BP Location: Left Arm, Patient Position: Sitting, Cuff Size: Normal)   Pulse 74   Temp 98.7 F (37.1 C) (Temporal)   Resp (!) 82   Ht 5\' 6"  (1.676 m)   Wt 130 lb 6.4 oz (59.1 kg)   SpO2 97%   BMI 21.05 kg/m    Physical Exam   Results for orders placed or performed in visit on 06/28/22  Hemoglobin A1c  Result Value Ref Range   Hgb A1c MFr Bld 5.6 4.6 - 6.5 %  Lipid panel  Result  Value Ref Range   Cholesterol 151 0 - 200 mg/dL   Triglycerides 409.8 (H) 0.0 - 149.0 mg/dL   HDL 11.91 (L) >47.82 mg/dL   VLDL 95.6 0.0 - 21.3 mg/dL   LDL Cholesterol 76 0 - 99 mg/dL   Total CHOL/HDL Ratio 4    NonHDL 113.81   Basic metabolic panel  Result Value Ref Range   Sodium 141 135 - 145 mEq/L   Potassium 3.4 (L) 3.5 - 5.1 mEq/L   Chloride 102 96 - 112 mEq/L   CO2 28 19 - 32 mEq/L   Glucose, Bld 109 (H) 70 - 99 mg/dL   BUN 8 6 - 23 mg/dL   Creatinine, Ser 0.86 0.40 - 1.50 mg/dL   GFR 57.84 >69.62 mL/min   Calcium 9.4 8.4 - 10.5 mg/dL      The 95-MWUX ASCVD risk score (Arnett DK, et al., 2019) is: 13.3%    Assessment & Plan:   Cigarette smoker  High triglycerides -     Lipid panel  Hypokalemia -     Basic metabolic panel -     Potassium Chloride Crys ER; TAKE 1 TABLET(20 MEQ) BY MOUTH DAILY  Dispense: 90 tablet; Refill: 2  Erectile dysfunction  due to arterial insufficiency -     Sildenafil Citrate; Take 0.5-1 tablets (50-100 mg total) by mouth daily as needed for erectile dysfunction.  Dispense: 20 tablet; Refill: 6  Prediabetes -     Hemoglobin A1c -     Basic metabolic panel  Essential hypertension    Return in about 6 months (around 12/28/2022).  He is down to 1/4 pack of cigarettes daily.  Encouraged him to continue cessation efforts but at this time he is not ready to move past.  Frequent request for Revatio  20 mg.  He takes 4 at a time.  Will try the higher dose generic Viagra.  Discussed starting Glucophage for prediabetes to prevent progression.  Mliss Sax, MD

## 2022-06-29 MED ORDER — POTASSIUM CHLORIDE CRYS ER 20 MEQ PO TBCR: EXTENDED_RELEASE_TABLET | ORAL | 2 refills | Status: DC

## 2022-06-29 NOTE — Addendum Note (Signed)
Addended by: Andrez Grime on: 06/29/2022 07:58 AM   Modules accepted: Orders

## 2022-07-20 ENCOUNTER — Telehealth: Payer: Self-pay | Admitting: Family Medicine

## 2022-07-20 DIAGNOSIS — Z0000 Encounter for general adult medical examination without abnormal findings: Secondary | ICD-10-CM

## 2022-07-20 DIAGNOSIS — I10 Essential (primary) hypertension: Secondary | ICD-10-CM

## 2022-07-20 MED ORDER — HYDROCHLOROTHIAZIDE 25 MG PO TABS: 25.0000 mg | ORAL_TABLET | Freq: Every day | ORAL | 1 refills | Status: DC

## 2022-07-20 NOTE — Telephone Encounter (Signed)
Pt would like all his meds to be changed to [facility] as his pharmacy.

## 2022-08-16 NOTE — Telephone Encounter (Signed)
Pt did not receive his med Temfibrozil please fill asap. He wants all his meds to go to [facility] on [address].

## 2022-08-17 ENCOUNTER — Telehealth: Payer: Self-pay | Admitting: Family Medicine

## 2022-08-17 DIAGNOSIS — Z1211 Encounter for screening for malignant neoplasm of colon: Secondary | ICD-10-CM

## 2022-08-17 NOTE — Telephone Encounter (Signed)
Pt stated that he has requested a colonoscopy and is checking on the status of thsi order.

## 2022-08-20 MED ORDER — GEMFIBROZIL 600 MG PO TABS: ORAL_TABLET | ORAL | 3 refills | Status: DC

## 2022-08-20 NOTE — Telephone Encounter (Signed)
Pt is needing a refill on his gemfibrozil (LOPID) 600 MG tablet [[id number]] . He has one pill left. Pt at [phone]   [facility] #[id number] [place], [place] - [address] AT [facility] OF [facility] [address] [place], [place] [address] Phone: [phone]  Fax: [phone] DEA #: [id number]

## 2022-08-20 NOTE — Addendum Note (Signed)
Addended by: Lake Bells on: 08/20/2022 03:31 PM   Modules accepted: Orders

## 2022-08-21 ENCOUNTER — Other Ambulatory Visit: Payer: Self-pay

## 2022-08-21 DIAGNOSIS — Z0000 Encounter for general adult medical examination without abnormal findings: Secondary | ICD-10-CM

## 2022-08-21 MED ORDER — GEMFIBROZIL 600 MG PO TABS: ORAL_TABLET | ORAL | 3 refills | Status: DC

## 2022-08-21 NOTE — Telephone Encounter (Signed)
Called patient regarding requested refill on Tenofovir per patient he only needs refill on Gemfibrozil which was sent to [facility] on 08/20/22. Patient would like this sent to [facility] instead. Rx sent to requested pharmacy patient aware

## 2022-08-21 NOTE — Telephone Encounter (Signed)
Requested referral placed patient will call to schedule appointment if he does not hear from anyone to schedule

## 2022-08-21 NOTE — Telephone Encounter (Signed)
Pt is upset that he has called for a refill several times for tenofovir (VIREAD) 300 MG tablet [[id number]] He is almost out and needs this asap.  He has changed his pharmacy to [facility] on [address]. I see it still says [facility].

## 2022-08-31 ENCOUNTER — Encounter: Payer: Self-pay | Admitting: Gastroenterology

## 2022-08-31 ENCOUNTER — Ambulatory Visit (AMBULATORY_SURGERY_CENTER): Payer: Commercial Managed Care - HMO | Admitting: *Deleted

## 2022-08-31 VITALS — Ht 66.0 in | Wt 130.0 lb

## 2022-08-31 DIAGNOSIS — Z1211 Encounter for screening for malignant neoplasm of colon: Secondary | ICD-10-CM

## 2022-08-31 MED ORDER — NA SULFATE-K SULFATE-MG SULF 17.5-3.13-1.6 GM/177ML PO SOLN
1.0000 | Freq: Once | ORAL | 0 refills | Status: AC
Start: 2022-08-31 — End: 2022-08-31

## 2022-08-31 NOTE — Progress Notes (Signed)
Pt's name and DOB verified at the beginning of the pre-visit.  Pt denies any difficulty with ambulating,sitting, laying down or rolling side to side Gave both [facility] main # and MD on call # prior to instructions.  No egg or soy allergy known to patient  No issues known to pt with past sedation with any surgeries or procedures  Pt has no issues moving head neck or swallowing No FH of Malignant Hyperthermia Pt is not on diet pills Pt is not on home 02  Pt is not on blood thinners  Pt denies issues with constipation  Pt is not on dialysis Pt denise any abnormal heart rhythms  Pt denies any upcoming cardiac testing Pt encouraged to use to use Singlecare or Goodrx to reduce cost  Patient's chart reviewed by Cathlyn Parsons CNRA prior to pre-visit and patient appropriate for the [facility].  Pre-visit completed and red dot placed by patient's name on their procedure day (on provider's schedule).  . Visit by phone Pt states weight is 130lb Instructed pt why it is important to and  to call if they have any changes in health or new medications. Directed them to the # given and on instructions.   Pt states they will.  Instructions reviewed with pt and pt states understanding. Instructed to review again prior to procedure. Pt states they will.  Instructions sent by mail with coupon and by my chart

## 2022-09-01 IMAGING — CT CT CHEST LUNG CANCER SCREENING LOW DOSE W/O CM
1 series · 10 of 10 positions shown, 13 images · non-contrast
Comparison: None.

CLINICAL DATA: Lung cancer screening. Current asymptomatic smoker.
Twenty-five pack-year history.

EXAM:
CT CHEST WITHOUT CONTRAST LOW-DOSE FOR LUNG CANCER SCREENING
TECHNIQUE: Multidetector CT imaging of the chest was performed following the
standard protocol without IV contrast.

[ct lung segmentation data · axial · 0.66mm/px · z∈[-292,-292]mm · 10 of 303 frames shown]
[frame 1/303  mediastinal]
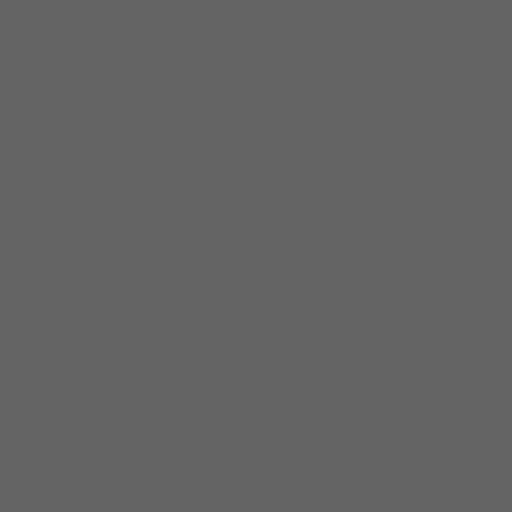
[frame 1/303  lung]
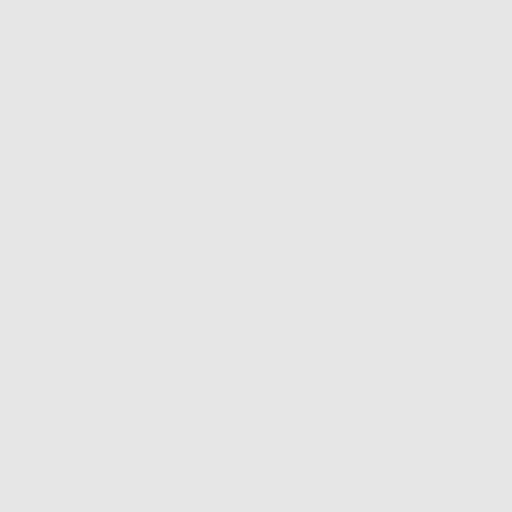
[frame 34/303  lung]
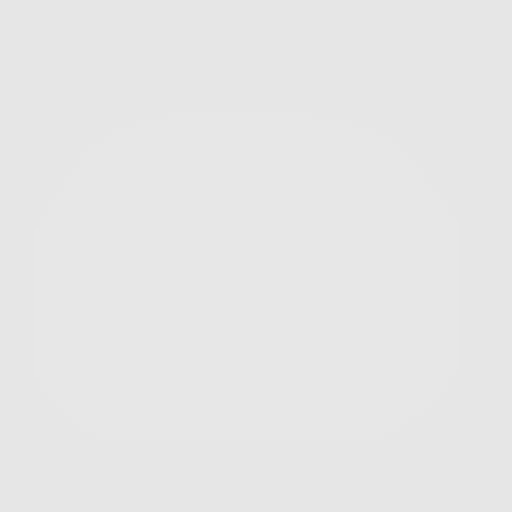
[frame 68/303  lung]
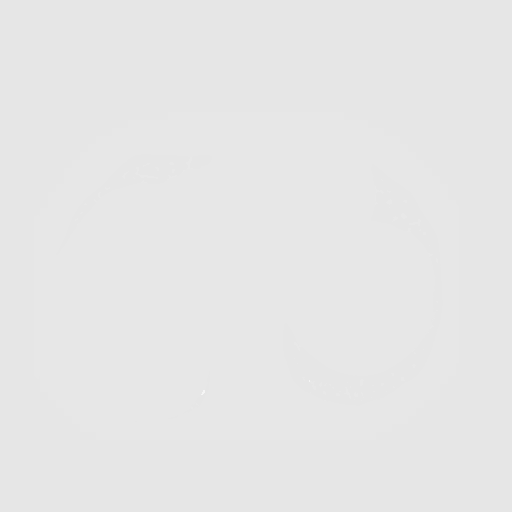
[frame 101/303  lung]
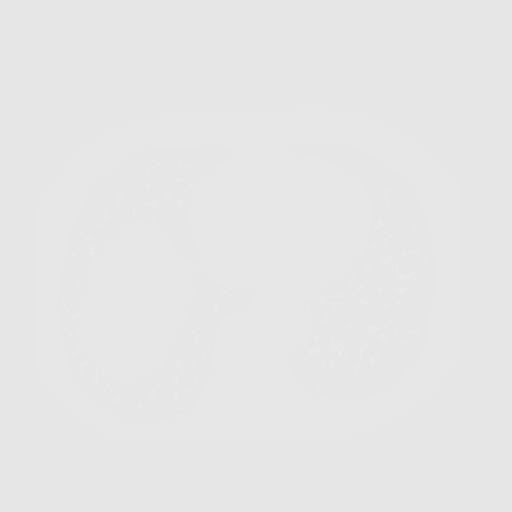
[frame 135/303  mediastinal]
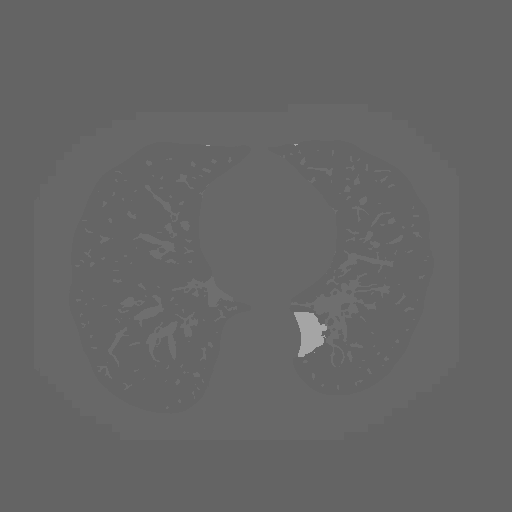
[frame 135/303  lung]
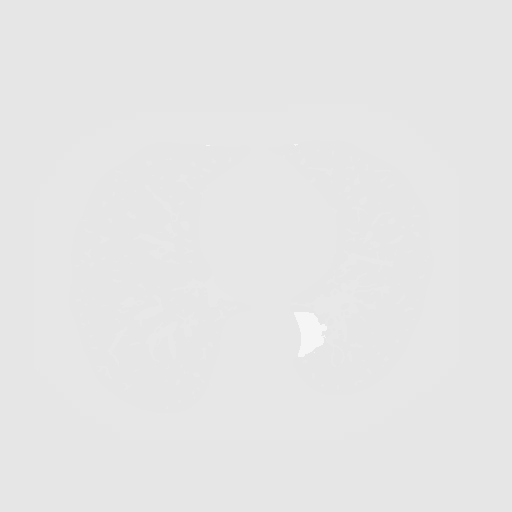
[frame 168/303  lung]
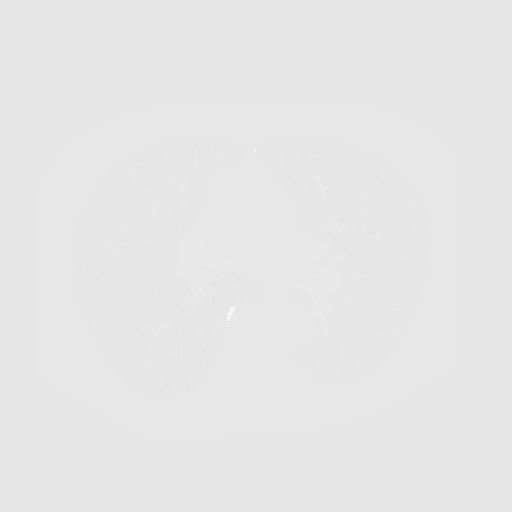
[frame 202/303  lung]
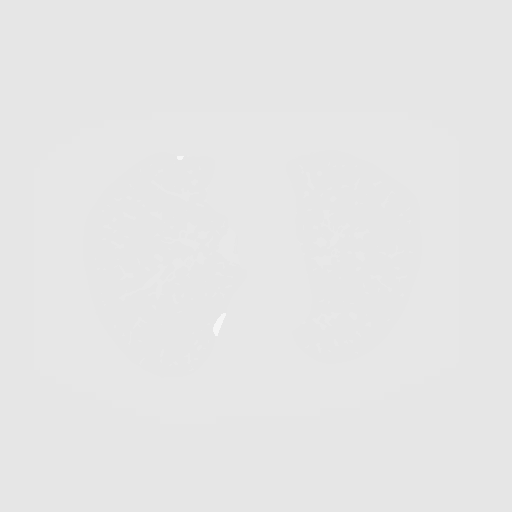
[frame 235/303  lung]
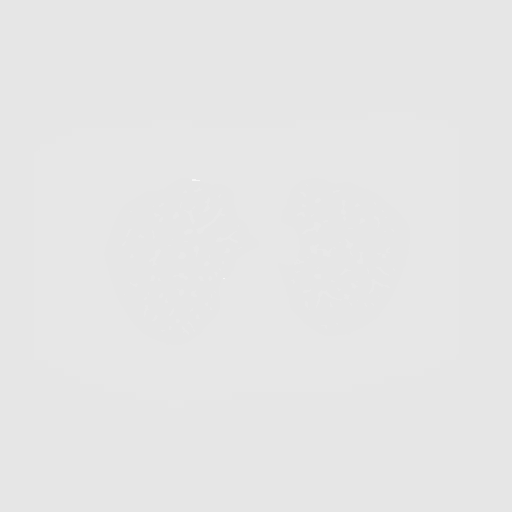
[frame 269/303  mediastinal]
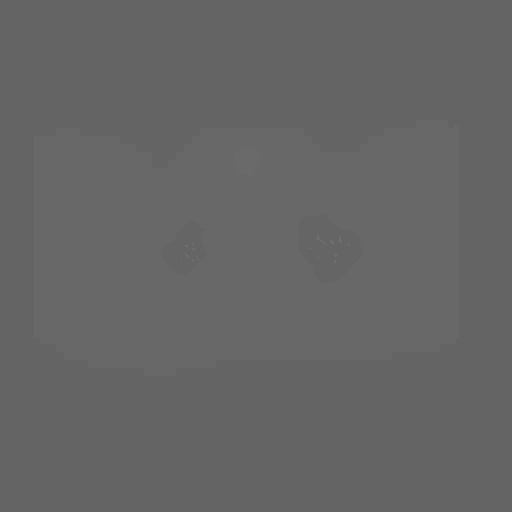
[frame 269/303  lung]
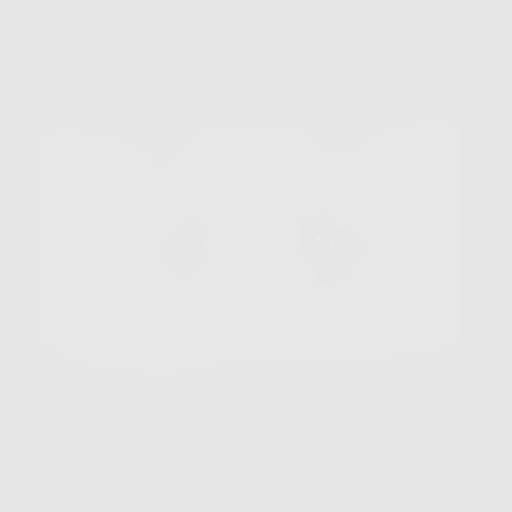
[frame 303/303  lung]
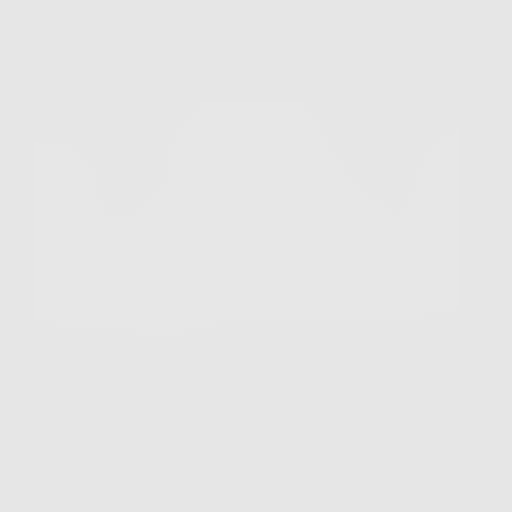

[10 of 10 positions shown; findings below may reference images not displayed]

FINDINGS: Cardiovascular: Heart size is normal. Aortic atherosclerosis.
Coronary artery calcifications. No pericardial effusion.

Mediastinum/Nodes: No enlarged mediastinal, hilar, or axillary lymph
nodes. Thyroid gland, trachea, and esophagus demonstrate no
significant findings.

Lungs/Pleura: Mild paraseptal and centrilobular emphysema. Areas of
varicoid bronchiectasis noted within the lower lobes bilaterally. No
pleural effusion, airspace consolidation, or pneumothorax. There is
a single, tiny perifissural nodule along the major fissure has a
mean derived diameter of 1.6 mm.

Upper Abdomen: Hepatic steatosis. No acute or suspicious osseous
findings.

Musculoskeletal: No chest wall mass or suspicious bone lesions
identified.
IMPRESSION: 1. Lung-RADS 1, negative. Continue annual screening with low-dose
chest CT without contrast in 12 months.
2. Hepatic steatosis.
3. Aortic Atherosclerosis (H7PDV-G4A.A) and Emphysema (H7PDV-RCI.7).

## 2022-09-05 ENCOUNTER — Other Ambulatory Visit: Payer: Self-pay | Admitting: Family Medicine

## 2022-09-05 DIAGNOSIS — E876 Hypokalemia: Secondary | ICD-10-CM

## 2022-09-06 ENCOUNTER — Encounter: Payer: Self-pay | Admitting: Gastroenterology

## 2022-09-06 ENCOUNTER — Telehealth: Payer: Self-pay | Admitting: Gastroenterology

## 2022-09-06 DIAGNOSIS — Z1211 Encounter for screening for malignant neoplasm of colon: Secondary | ICD-10-CM

## 2022-09-06 MED ORDER — NA SULFATE-K SULFATE-MG SULF 17.5-3.13-1.6 GM/177ML PO SOLN
1.0000 | Freq: Once | ORAL | 0 refills | Status: AC
Start: 2022-09-06 — End: 2022-09-06

## 2022-09-06 NOTE — Telephone Encounter (Signed)
Sent Suprep into [facility] on [address].  Pt made aware

## 2022-09-06 NOTE — Telephone Encounter (Signed)
PT is calling to have prep RX sent to [facility] on [address]

## 2022-09-12 ENCOUNTER — Encounter: Payer: Commercial Managed Care - HMO | Admitting: Gastroenterology

## 2022-10-02 ENCOUNTER — Ambulatory Visit (INDEPENDENT_AMBULATORY_CARE_PROVIDER_SITE_OTHER): Payer: Commercial Managed Care - HMO | Admitting: Internal Medicine

## 2022-10-02 ENCOUNTER — Other Ambulatory Visit: Payer: Self-pay

## 2022-10-02 ENCOUNTER — Encounter: Payer: Self-pay | Admitting: Internal Medicine

## 2022-10-02 VITALS — BP 121/72 | HR 77 | Resp 16 | Ht 66.0 in | Wt 128.0 lb

## 2022-10-02 DIAGNOSIS — B181 Chronic viral hepatitis B without delta-agent: Secondary | ICD-10-CM

## 2022-10-02 MED ORDER — TENOFOVIR DISOPROXIL FUMARATE 300 MG PO TABS: 300.0000 mg | ORAL_TABLET | Freq: Every day | ORAL | 11 refills | Status: DC

## 2022-10-02 NOTE — Progress Notes (Signed)
Patient ID: Gregory Singh, male   DOB: [birth year], 62 y.o.   MRN: [id number]  HPI Gregory Singh is a 62yo M with well controlled chronic hep B -currently on tenofovir. Doing well. Has oop cost of $33 per month. Doing well otherwise with his health. Good appetite. 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 Takes all other medications.  Outpatient Encounter Medications as of 10/02/2022  Medication Sig   b complex vitamins capsule Take 1 capsule by mouth daily.   clobetasol ointment (TEMOVATE) 0.05 % Apply bid to rash on leg liberally, do not use on face or genitals   gemfibrozil (LOPID) 600 MG tablet TAKE 1 TABLET(600 MG) BY MOUTH TWICE DAILY   hydrochlorothiazide (HYDRODIURIL) 25 MG tablet Take 1 tablet (25 mg total) by mouth daily.   Multiple Vitamins-Minerals (MULTIVITAMIN WITH IRON-MINERALS) liquid Take by mouth daily.   omega-3 acid ethyl esters (LOVAZA) 1 g capsule Take 2 capsules (2 g total) by mouth daily.   potassium chloride SA (KLOR-CON M) 20 MEQ tablet TAKE 1 TABLET(20 MEQ) BY MOUTH DAILY   sildenafil (VIAGRA) 100 MG tablet Take 0.5-1 tablets (50-100 mg total) by mouth daily as needed for erectile dysfunction.   tenofovir (VIREAD) 300 MG tablet Take 1 tablet (300 mg total) by mouth daily. (Patient not taking: Reported on 08/31/2022)   No facility-administered encounter medications on file as of 10/02/2022.     Patient Active Problem List   Diagnosis Date Noted   COPD  GOLD 0/ active smoker 11/08/2020   Cigarette smoker 11/08/2020   Hepatitis B 01/19/2015   Thrombocytopenia (HCC) 01/10/2015   Tremor of both hands 01/10/2015   High triglycerides 01/10/2015   Neck pain 05/21/2011   Shoulder pain 05/21/2011   Neck pain on right side 05/15/2011     There are no preventive care reminders to display for this patient.   Review of Systems Review of Systems  Constitutional: Negative for fever, chills, diaphoresis, activity change, appetite change, fatigue and unexpected  weight change.  HENT: Negative for congestion, sore throat, rhinorrhea, sneezing, trouble swallowing and sinus pressure.  Eyes: Negative for photophobia and visual disturbance.  Respiratory: Negative for cough, chest tightness, shortness of breath, wheezing and stridor.  Cardiovascular: Negative for chest pain, palpitations and leg swelling.  Gastrointestinal: Negative for nausea, vomiting, abdominal pain, diarrhea, constipation, blood in stool, abdominal distention and anal bleeding.  Genitourinary: Negative for dysuria, hematuria, flank pain and difficulty urinating.  Musculoskeletal: Negative for myalgias, back pain, joint swelling, arthralgias and gait problem.  Skin: Negative for color change, pallor, rash and wound.  Neurological: Negative for dizziness, tremors, weakness and light-headedness.  Hematological: Negative for adenopathy. Does not bruise/bleed easily.  Psychiatric/Behavioral: Negative for behavioral problems, confusion, sleep disturbance, dysphoric mood, decreased concentration and agitation.   Physical Exam   BP 121/72   Pulse 77   Resp 16   Ht 5\' 6"  (1.676 m)   Wt 128 lb (58.1 kg)   SpO2 98%   BMI 20.66 kg/m    Physical Exam  Constitutional: He is oriented to person, place, and time. He appears well-developed and well-nourished. No distress.  HENT:  Mouth/Throat: Oropharynx is clear and moist. No oropharyngeal exudate.  Cardiovascular: Normal rate, regular rhythm and normal heart sounds. Exam reveals no gallop and no friction rub.  No murmur heard.  Pulmonary/Chest: Effort normal and breath sounds normal. No respiratory distress. He has no wheezes.  Abdominal: Soft. Bowel sounds are normal. He exhibits no distension. There is no  tenderness.  Lymphadenopathy:  He has no cervical adenopathy.  Neurological: He is alert and oriented to person, place, and time.  Skin: Skin is warm and dry. No rash noted. No erythema.  Psychiatric: He has a normal mood and affect.  His behavior is normal.   Lab Results  Component Value Date   HEPBSAB NON-REACTIVE 09/21/2021     CBC Lab Results  Component Value Date   WBC 6.8 03/22/2022   RBC 4.84 03/22/2022   HGB 17.2 (H) 03/22/2022   HCT 47.8 03/22/2022   PLT 110 (L) 03/22/2022   MCV 98.8 03/22/2022   MCH 35.5 (H) 03/22/2022   MCHC 36.0 03/22/2022   RDW 12.2 03/22/2022   LYMPHSABS 2,366 03/22/2022   MONOABS 477 04/24/2016   EOSABS 129 03/22/2022    BMET Lab Results  Component Value Date   NA 141 06/28/2022   K 3.4 (L) 06/28/2022   CL 102 06/28/2022   CO2 28 06/28/2022   GLUCOSE 109 (H) 06/28/2022   BUN 8 06/28/2022   CREATININE 0.85 06/28/2022   CALCIUM 9.4 06/28/2022   GFRNONAA 99 11/26/2018   GFRAA 115 11/26/2018      Assessment and Plan  Will check hep B labs to see that viral load is low/undetectable Will provide refills on tenofovir and also have them give coupon card for good rx Will also set up for RUQ U/S for [facility] surveillance

## 2022-10-04 ENCOUNTER — Ambulatory Visit: Payer: Commercial Managed Care - HMO | Admitting: Internal Medicine

## 2022-10-04 LAB — HEPATIC FUNCTION PANEL
AG Ratio: 1.4 (calc) (ref 1.0–2.5)
ALT: 58 U/L — ABNORMAL HIGH (ref 9–46)
AST: 95 U/L — ABNORMAL HIGH (ref 10–35)
Albumin: 4.1 g/dL (ref 3.6–5.1)
Alkaline phosphatase (APISO): 123 U/L (ref 35–144)
Bilirubin, Direct: 0.4 mg/dL — ABNORMAL HIGH (ref 0.0–0.2)
Globulin: 2.9 g/dL (calc) (ref 1.9–3.7)
Indirect Bilirubin: 0.7 mg/dL (calc) (ref 0.2–1.2)
Total Bilirubin: 1.1 mg/dL (ref 0.2–1.2)
Total Protein: 7 g/dL (ref 6.1–8.1)

## 2022-10-04 LAB — HEPATITIS B DNA, ULTRAQUANTITATIVE, PCR
Hepatitis B DNA: NOT DETECTED IU/mL
Hepatitis B virus DNA: NOT DETECTED Log IU/mL

## 2022-10-04 LAB — HEPATITIS B E ANTIBODY: Hep B E Ab: REACTIVE — AB

## 2022-10-04 LAB — HEPATITIS B SURFACE ANTIBODY,QUALITATIVE: Hep B S Ab: NONREACTIVE

## 2022-10-17 ENCOUNTER — Ambulatory Visit
Admission: RE | Admit: 2022-10-17 | Discharge: 2022-10-17 | Disposition: A | Discharge status: Home / Self Care | Payer: Commercial Managed Care - HMO | Source: Ambulatory Visit | Attending: Internal Medicine | Admitting: Internal Medicine

## 2022-10-17 DIAGNOSIS — B181 Chronic viral hepatitis B without delta-agent: Secondary | ICD-10-CM

## 2022-10-31 ENCOUNTER — Ambulatory Visit (AMBULATORY_SURGERY_CENTER): Payer: Commercial Managed Care - HMO | Admitting: Gastroenterology

## 2022-10-31 ENCOUNTER — Encounter: Payer: Self-pay | Admitting: Gastroenterology

## 2022-10-31 VITALS — BP 125/79 | HR 63 | Temp 99.1°F | Resp 13

## 2022-10-31 DIAGNOSIS — Z09 Encounter for follow-up examination after completed treatment for conditions other than malignant neoplasm: Secondary | ICD-10-CM

## 2022-10-31 DIAGNOSIS — Z86010 Personal history of colonic polyps: Secondary | ICD-10-CM

## 2022-10-31 DIAGNOSIS — Z1211 Encounter for screening for malignant neoplasm of colon: Secondary | ICD-10-CM

## 2022-10-31 MED ORDER — SODIUM CHLORIDE 0.9 % IV SOLN: 500.0000 mL | Freq: Once | INTRAVENOUS | Status: DC

## 2022-10-31 NOTE — Progress Notes (Signed)
Uneventful anesthetic. Report to pacu rn. Vss. Care resumed by rn. 

## 2022-10-31 NOTE — Patient Instructions (Signed)
Reschedule colonoscopy for inadequate prep. (Scheduled)  YOU HAD AN ENDOSCOPIC PROCEDURE TODAY AT THE [facility]:   Refer to the procedure report that was given to you for any specific questions about what was found during the examination.  If the procedure report does not answer your questions, please call your gastroenterologist to clarify.  If you requested that your care partner not be given the details of your procedure findings, then the procedure report has been included in a sealed envelope for you to review at your convenience later.  YOU SHOULD EXPECT: Some feelings of bloating in the abdomen. Passage of more gas than usual.  Walking can help get rid of the air that was put into your GI tract during the procedure and reduce the bloating. If you had a lower endoscopy (such as a colonoscopy or flexible sigmoidoscopy) you may notice spotting of blood in your stool or on the toilet paper. If you underwent a bowel prep for your procedure, you may not have a normal bowel movement for a few days.  Please Note:  You might notice some irritation and congestion in your nose or some drainage.  This is from the oxygen used during your procedure.  There is no need for concern and it should clear up in a day or so.  SYMPTOMS TO REPORT IMMEDIATELY:  Following lower endoscopy (colonoscopy or flexible sigmoidoscopy):  Excessive amounts of blood in the stool  Significant tenderness or worsening of abdominal pains  Swelling of the abdomen that is new, acute  Fever of 100F or higher    For urgent or emergent issues, a gastroenterologist can be reached at any hour by calling [phone]. Do not use MyChart messaging for urgent concerns.    DIET:  We do recommend a small meal at first, but then you may proceed to your regular diet.  Drink plenty of fluids but you should avoid alcoholic beverages for 24 hours.  ACTIVITY:  You should plan to take it easy for the rest of today and you  should NOT DRIVE or use heavy machinery until tomorrow (because of the sedation medicines used during the test).    FOLLOW UP: Our staff will call the number listed on your records the next business day following your procedure.  We will call around 7:15- 8:00 am to check on you and address any questions or concerns that you may have regarding the information given to you following your procedure. If we do not reach you, we will leave a message.     If any biopsies were taken you will be contacted by phone or by letter within the next 1-3 weeks.  Please call us at [phone] if you have not heard about the biopsies in 3 weeks.    SIGNATURES/CONFIDENTIALITY: You and/or your care partner have signed paperwork which will be entered into your electronic medical record.  These signatures attest to the fact that that the information above on your After Visit Summary has been reviewed and is understood.  Full responsibility of the confidentiality of this discharge information lies with you and/or your care-partner.

## 2022-10-31 NOTE — Progress Notes (Signed)
[facility] History and Physical   Primary Care Physician:  Mliss Sax, MD   Reason for Procedure:   H/O Polyps ( had 3 prior colonoscopies in [facility]-  records not available with polyps)  Plan:    Colon     HPI: Gregory Singh is a 62 y.o. male    with history of polyps- 3 prior colonoscopies at [facility]- ?bethany  last colonoscopy 2 years ago per patient had 3 polyps  being followed by Dr.Nunez for hepB Past Medical History:  Diagnosis Date   Hypertension     History reviewed. No pertinent surgical history.  Prior to Admission medications   Medication Sig Start Date End Date Taking? Authorizing Provider  b complex vitamins capsule Take 1 capsule by mouth daily.   Yes [provider]  gemfibrozil (LOPID) 600 MG tablet TAKE 1 TABLET(600 MG) BY MOUTH TWICE DAILY 08/21/22  Yes Mliss Sax, MD  hydrochlorothiazide (HYDRODIURIL) 25 MG tablet Take 1 tablet (25 mg total) by mouth daily. 07/20/22  Yes Mliss Sax, MD  Multiple Vitamins-Minerals (MULTIVITAMIN WITH IRON-MINERALS) liquid Take by mouth daily.   Yes [provider]  omega-3 acid ethyl esters (LOVAZA) 1 g capsule Take 2 capsules (2 g total) by mouth daily. 01/15/17  Yes English, Judeth Cornfield D, PA  potassium chloride SA (KLOR-CON M) 20 MEQ tablet TAKE 1 TABLET(20 MEQ) BY MOUTH DAILY 09/05/22  Yes Mliss Sax, MD  tenofovir (VIREAD) 300 MG tablet Take 1 tablet (300 mg total) by mouth daily. 10/02/22  Yes Judyann Munson, MD  clobetasol ointment (TEMOVATE) 0.05 % Apply bid to rash on leg liberally, do not use on face or genitals 12/27/21   Mliss Sax, MD  sildenafil (VIAGRA) 100 MG tablet Take 0.5-1 tablets (50-100 mg total) by mouth daily as needed for erectile dysfunction. 06/28/22   Mliss Sax, MD    Current Outpatient Medications  Medication Sig Dispense Refill   b complex vitamins capsule Take 1 capsule by mouth daily.     gemfibrozil  (LOPID) 600 MG tablet TAKE 1 TABLET(600 MG) BY MOUTH TWICE DAILY 180 tablet 3   hydrochlorothiazide (HYDRODIURIL) 25 MG tablet Take 1 tablet (25 mg total) by mouth daily. 90 tablet 1   Multiple Vitamins-Minerals (MULTIVITAMIN WITH IRON-MINERALS) liquid Take by mouth daily.     omega-3 acid ethyl esters (LOVAZA) 1 g capsule Take 2 capsules (2 g total) by mouth daily. 30 capsule 2   potassium chloride SA (KLOR-CON M) 20 MEQ tablet TAKE 1 TABLET(20 MEQ) BY MOUTH DAILY 90 tablet 2   tenofovir (VIREAD) 300 MG tablet Take 1 tablet (300 mg total) by mouth daily. 30 tablet 11   clobetasol ointment (TEMOVATE) 0.05 % Apply bid to rash on leg liberally, do not use on face or genitals 60 g 1   sildenafil (VIAGRA) 100 MG tablet Take 0.5-1 tablets (50-100 mg total) by mouth daily as needed for erectile dysfunction. 20 tablet 6   Current Facility-Administered Medications  Medication Dose Route Frequency Provider Last Rate Last Admin   0.9 %  sodium chloride infusion  500 mL Intravenous Once Lynann Bologna, MD        Allergies as of 10/31/2022   (No Known Allergies)    Family History  Problem Relation Age of Onset   Healthy Mother    Diabetes Father    Stroke Father    Colon cancer Neg Hx    Colon polyps Neg Hx    Esophageal cancer Neg  Hx    Rectal cancer Neg Hx    Stomach cancer Neg Hx     Social History   Socioeconomic History   Marital status: Married    Spouse name: Not on file   Number of children: Not on file   Years of education: Not on file   Highest education level: Not on file  Occupational History   Not on file  Tobacco Use   Smoking status: Every Day    Current packs/day: 0.25    Average packs/day: 0.2 packs/day for 42.6 years (10.7 ttl pk-yrs)    Types: Cigarettes    Start date: 03/19/1980   Smokeless tobacco: Never   Tobacco comments:    Patient reports 6 cigs a day.  Vaping Use   Vaping status: Never Used  Substance and Sexual Activity   Alcohol use: Yes     Alcohol/week: 24.0 standard drinks of alcohol    Types: 24 Standard drinks or equivalent per week    Comment: beer 2 daily   Drug use: No   Sexual activity: Yes  Other Topics Concern   Not on file  Social History Narrative   Not on file   Social Determinants of Health   Financial Resource Strain: Not on file  Food Insecurity: Not on file  Transportation Needs: Not on file  Physical Activity: Not on file  Stress: Not on file  Social Connections: Not on file  Intimate Partner Violence: Not on file    Review of Systems: Positive for none All other review of systems negative except as mentioned in the HPI.  Physical Exam: Vital signs in last 24 hours: @VSRANGES @   General:   Alert,  Well-developed, well-nourished, pleasant and cooperative in NAD Lungs:  Clear throughout to auscultation.   Heart:  Regular rate and rhythm; no murmurs, clicks, rubs,  or gallops. Abdomen:  Soft, nontender and nondistended. Normal bowel sounds.   Neuro/Psych:  Alert and cooperative. Normal mood and affect. A and O x 3    No significant changes were identified.  The patient continues to be an appropriate candidate for the planned procedure and anesthesia.   Edman Circle, MD. [facility] 10/31/2022 8:53 AM@

## 2022-10-31 NOTE — Op Note (Signed)
[facility] Patient Name: Gregory Singh Procedure Date: 10/31/2022 8:52 AM MRN: [id number] Endoscopist: Lynann Bologna , MD, [id number] Age: 62 Referring MD:  Date of Birth: Jan 10, 1961 Gender: Male Account #: [id number] Procedure:                Colonoscopy Indications:              High risk colon cancer surveillance: Personal                            history of colonic polyps Medicines:                Monitored Anesthesia Care Procedure:                Pre-Anesthesia Assessment:                           - Prior to the procedure, a History and Physical                            was performed, and patient medications and                            allergies were reviewed. The patient's tolerance of                            previous anesthesia was also reviewed. The risks                            and benefits of the procedure and the sedation                            options and risks were discussed with the patient.                            All questions were answered, and informed consent                            was obtained. Prior Anticoagulants: The patient has                            taken no anticoagulant or antiplatelet agents. ASA                            Grade Assessment: II - A patient with mild systemic                            disease. After reviewing the risks and benefits,                            the patient was deemed in satisfactory condition to                            undergo the procedure.  After obtaining informed consent, the colonoscope                            was passed under direct vision. Throughout the                            procedure, the patient's blood pressure, pulse, and                            oxygen saturations were monitored continuously. The                            Olympus PCF-H190DL (#[id number]) Colonoscope was                            introduced through the anus with the intention of                             advancing to the cecum. The scope was advanced to                            the sigmoid colon before the procedure was aborted.                            Medications were given. The colonoscopy was                            performed without difficulty. The patient tolerated                            the procedure well. The quality of the bowel                            preparation was unsatisfactory. Scope In: 8:59:52 AM Scope Out: 9:03:41 AM Total Procedure Duration: 0 hours 3 minutes 49 seconds  Findings:                 Stool was found in the rectum, in the recto-sigmoid                            colon and in the sigmoid colon, precluding                            visualization. Complications:            No immediate complications. Estimated Blood Loss:     Estimated blood loss: none. Impression:               - Preparation of the colon was unsatisfactory.                           - Stool in the rectum, in the recto-sigmoid colon                            and in the sigmoid colon.                           -  No specimens collected. Recommendation:           - Patient has a contact number available for                            emergencies. The signs and symptoms of potential                            delayed complications were discussed with the                            patient. Return to normal activities tomorrow.                            Written discharge instructions were provided to the                            patient.                           - Resume previous diet.                           - Rpt prep and rpt colon                           - The findings and recommendations were discussed                            with the patient's family. Lynann Bologna, MD 10/31/2022 9:15:30 AM This report has been signed electronically.

## 2022-10-31 NOTE — Progress Notes (Signed)
Pt's states no medical or surgical changes since previsit or office visit. 

## 2022-11-01 ENCOUNTER — Telehealth: Payer: Self-pay

## 2022-11-01 NOTE — Telephone Encounter (Signed)
No answer, unable to leave a message, B.Schwartz RN. 

## 2022-12-27 ENCOUNTER — Other Ambulatory Visit: Payer: Self-pay | Admitting: Family Medicine

## 2022-12-27 DIAGNOSIS — I10 Essential (primary) hypertension: Secondary | ICD-10-CM

## 2022-12-31 ENCOUNTER — Ambulatory Visit (INDEPENDENT_AMBULATORY_CARE_PROVIDER_SITE_OTHER): Payer: Managed Care, Other (non HMO) | Admitting: Family Medicine

## 2022-12-31 ENCOUNTER — Encounter: Payer: Self-pay | Admitting: Family Medicine

## 2022-12-31 VITALS — BP 118/72 | HR 75 | Temp 98.5°F | Ht 66.0 in | Wt 116.0 lb

## 2022-12-31 DIAGNOSIS — F1090 Alcohol use, unspecified, uncomplicated: Secondary | ICD-10-CM

## 2022-12-31 DIAGNOSIS — I10 Essential (primary) hypertension: Secondary | ICD-10-CM

## 2022-12-31 DIAGNOSIS — R7303 Prediabetes: Secondary | ICD-10-CM | POA: Diagnosis not present

## 2022-12-31 DIAGNOSIS — Z125 Encounter for screening for malignant neoplasm of prostate: Secondary | ICD-10-CM | POA: Diagnosis not present

## 2022-12-31 DIAGNOSIS — Z0000 Encounter for general adult medical examination without abnormal findings: Secondary | ICD-10-CM

## 2022-12-31 DIAGNOSIS — Z23 Encounter for immunization: Secondary | ICD-10-CM

## 2022-12-31 DIAGNOSIS — E781 Pure hyperglyceridemia: Secondary | ICD-10-CM

## 2022-12-31 DIAGNOSIS — F17210 Nicotine dependence, cigarettes, uncomplicated: Secondary | ICD-10-CM

## 2022-12-31 DIAGNOSIS — E876 Hypokalemia: Secondary | ICD-10-CM

## 2022-12-31 DIAGNOSIS — Z789 Other specified health status: Secondary | ICD-10-CM

## 2022-12-31 LAB — BASIC METABOLIC PANEL
BUN: 11 mg/dL (ref 6–23)
CO2: 31 meq/L (ref 19–32)
Calcium: 9.8 mg/dL (ref 8.4–10.5)
Chloride: 98 meq/L (ref 96–112)
Creatinine, Ser: 0.85 mg/dL (ref 0.40–1.50)
GFR: 94.55 mL/min (ref >60.00)
Glucose, Bld: 84 mg/dL (ref 70–99)
Potassium: 3.1 meq/L — ABNORMAL LOW (ref 3.5–5.1)
Sodium: 139 meq/L (ref 135–145)

## 2022-12-31 LAB — CBC
HCT: 49.8 % (ref 39.0–52.0)
Hemoglobin: 16.9 g/dL (ref 13.0–17.0)
MCHC: 34 g/dL (ref 30.0–36.0)
MCV: 105.3 fL — ABNORMAL HIGH (ref 78.0–100.0)
Platelets: 105 10*3/uL — ABNORMAL LOW (ref 150.0–400.0)
RBC: 4.73 Mil/uL (ref 4.22–5.81)
RDW: 13.8 % (ref 11.5–15.5)
WBC: 4.3 10*3/uL (ref 4.0–10.5)

## 2022-12-31 LAB — URINALYSIS, ROUTINE W REFLEX MICROSCOPIC
Bilirubin Urine: NEGATIVE
Hgb urine dipstick: NEGATIVE
Leukocytes,Ua: NEGATIVE
Nitrite: NEGATIVE
RBC / HPF: NONE SEEN (ref 0–?)
Specific Gravity, Urine: 1.02 (ref 1.000–1.030)
Total Protein, Urine: NEGATIVE
Urine Glucose: NEGATIVE
Urobilinogen, UA: 1 (ref 0.0–1.0)
pH: 6.5 (ref 5.0–8.0)

## 2022-12-31 LAB — LIPID PANEL
Cholesterol: 152 mg/dL (ref 0–200)
HDL: 47.9 mg/dL (ref >39.00)
LDL Cholesterol: 81 mg/dL (ref 0–99)
NonHDL: 103.6
Total CHOL/HDL Ratio: 3
Triglycerides: 112 mg/dL (ref 0.0–149.0)
VLDL: 22.4 mg/dL (ref 0.0–40.0)

## 2022-12-31 LAB — HEMOGLOBIN A1C: Hgb A1c MFr Bld: 5.3 % (ref 4.6–6.5)

## 2022-12-31 LAB — PSA: PSA: 0.57 ng/mL (ref 0.10–4.00)

## 2022-12-31 MED ORDER — POTASSIUM CHLORIDE CRYS ER 20 MEQ PO TBCR
EXTENDED_RELEASE_TABLET | ORAL | 2 refills | Status: DC
Start: 2022-12-31 — End: 2023-03-30

## 2022-12-31 NOTE — Progress Notes (Addendum)
Established Patient Office Visit   Subjective:  Patient ID: Gregory Singh, male    DOB: [birth year]  Age: 62 y.o. MRN: [id number]  Chief Complaint  Patient presents with   Follow-up    14month fu    HPI Encounter Diagnoses  Name Primary?   Healthcare maintenance Yes   Essential hypertension    High triglycerides    Prediabetes    Cigarette smoker    Alcohol use    Need for vaccination    Screening for prostate cancer    Hypokalemia    In for physical and follow-up of above.  Doing well and feels well.  Continues to work long hours in his [employer] business.  Continues follow-up with ID for chronic hepatitis B.  Blood pressure controlled with 25 mg of HCTZ taken with potassium.  Smokes a third of a pack a day with yearly follow-up CTs.  Continues to consume about 3 beers daily.  He is quite active on his job.  Going to [place] at the end of November to visit his parents for 45 days.  He is accompanied by his significant other.   Review of Systems  Constitutional: Negative.   HENT: Negative.    Eyes:  Negative for blurred vision, discharge and redness.  Respiratory: Negative.  Negative for shortness of breath.   Cardiovascular: Negative.   Gastrointestinal:  Negative for abdominal pain.  Genitourinary: Negative.   Musculoskeletal: Negative.  Negative for myalgias.  Skin:  Negative for rash.  Neurological:  Negative for tingling, loss of consciousness and weakness.  Endo/Heme/Allergies:  Negative for polydipsia.     Current Outpatient Medications:    b complex vitamins capsule, Take 1 capsule by mouth daily., Disp: , Rfl:    clobetasol ointment (TEMOVATE) 0.05 %, Apply bid to rash on leg liberally, do not use on face or genitals, Disp: 60 g, Rfl: 1   gemfibrozil (LOPID) 600 MG tablet, TAKE 1 TABLET(600 MG) BY MOUTH TWICE DAILY, Disp: 180 tablet, Rfl: 3   hydrochlorothiazide (HYDRODIURIL) 25 MG tablet, Take 1 tablet by mouth once daily, Disp: 90 tablet, Rfl: 0   Multiple  Vitamins-Minerals (MULTIVITAMIN WITH IRON-MINERALS) liquid, Take by mouth daily., Disp: , Rfl:    omega-3 acid ethyl esters (LOVAZA) 1 g capsule, Take 2 capsules (2 g total) by mouth daily., Disp: 30 capsule, Rfl: 2   potassium chloride SA (KLOR-CON M) 20 MEQ tablet, TAKE 1 TABLET(20 MEQ) BY MOUTH DAILY, Disp: 90 tablet, Rfl: 2   sildenafil (VIAGRA) 100 MG tablet, Take 0.5-1 tablets (50-100 mg total) by mouth daily as needed for erectile dysfunction., Disp: 20 tablet, Rfl: 6   tenofovir (VIREAD) 300 MG tablet, Take 1 tablet (300 mg total) by mouth daily., Disp: 30 tablet, Rfl: 11   Objective:     BP 118/72 (BP Location: Left Arm, Patient Position: Sitting, Cuff Size: Normal)   Pulse 75   Temp 98.5 F (36.9 C) (Temporal)   Ht 5\' 6"  (1.676 m)   Wt 116 lb (52.6 kg)   SpO2 96%   BMI 18.72 kg/m    Physical Exam Constitutional:      General: He is not in acute distress.    Appearance: Normal appearance. He is not ill-appearing, toxic-appearing or diaphoretic.  HENT:     Head: Normocephalic and atraumatic.     Right Ear: External ear normal.     Left Ear: External ear normal.     Ears:     Comments: TMs are scarred  Mouth/Throat:     Mouth: Mucous membranes are moist.     Pharynx: Oropharynx is clear. No oropharyngeal exudate or posterior oropharyngeal erythema.  Eyes:     General: No scleral icterus.       Right eye: No discharge.        Left eye: No discharge.     Extraocular Movements: Extraocular movements intact.     Conjunctiva/sclera: Conjunctivae normal.     Pupils: Pupils are equal, round, and reactive to light.  Cardiovascular:     Rate and Rhythm: Normal rate and regular rhythm.  Pulmonary:     Effort: Pulmonary effort is normal. No respiratory distress.     Breath sounds: Decreased air movement present. Decreased breath sounds present.  Abdominal:     General: Bowel sounds are normal.     Tenderness: There is no abdominal tenderness. There is no guarding.   Musculoskeletal:     Cervical back: No rigidity or tenderness.  Skin:    General: Skin is warm and dry.  Neurological:     Mental Status: He is alert and oriented to person, place, and time.  Psychiatric:        Mood and Affect: Mood normal.        Behavior: Behavior normal.      Results for orders placed or performed in visit on 12/31/22  CBC  Result Value Ref Range   WBC 4.3 4.0 - 10.5 K/uL   RBC 4.73 4.22 - 5.81 Mil/uL   Platelets 105.0 (L) 150.0 - 400.0 K/uL   Hemoglobin 16.9 13.0 - 17.0 g/dL   HCT 40.9 81.1 - 91.4 %   MCV 105.3 (H) 78.0 - 100.0 fl   MCHC 34.0 30.0 - 36.0 g/dL   RDW 78.2 95.6 - 21.3 %  Hemoglobin A1c  Result Value Ref Range   Hgb A1c MFr Bld 5.3 4.6 - 6.5 %  Lipid panel  Result Value Ref Range   Cholesterol 152 0 - 200 mg/dL   Triglycerides 086.5 0.0 - 149.0 mg/dL   HDL 78.46 >96.29 mg/dL   VLDL 52.8 0.0 - 41.3 mg/dL   LDL Cholesterol 81 0 - 99 mg/dL   Total CHOL/HDL Ratio 3    NonHDL 103.60   Basic metabolic panel  Result Value Ref Range   Sodium 139 135 - 145 mEq/L   Potassium 3.1 (L) 3.5 - 5.1 mEq/L   Chloride 98 96 - 112 mEq/L   CO2 31 19 - 32 mEq/L   Glucose, Bld 84 70 - 99 mg/dL   BUN 11 6 - 23 mg/dL   Creatinine, Ser 2.44 0.40 - 1.50 mg/dL   GFR 01.02 >72.53 mL/min   Calcium 9.8 8.4 - 10.5 mg/dL  PSA  Result Value Ref Range   PSA 0.57 0.10 - 4.00 ng/mL  Urinalysis, Routine w reflex microscopic  Result Value Ref Range   Color, Urine Dark Yellow (A) Yellow;Lt. Yellow;Straw;Dark Yellow;Amber;Green;Red;Brown   APPearance CLEAR Clear;Turbid;Slightly Cloudy;Cloudy   Specific Gravity, Urine 1.020 1.000 - 1.030   pH 6.5 5.0 - 8.0   Total Protein, Urine NEGATIVE Negative   Urine Glucose NEGATIVE Negative   Ketones, ur TRACE (A) Negative   Bilirubin Urine NEGATIVE Negative   Hgb urine dipstick NEGATIVE Negative   Urobilinogen, UA 1.0 0.0 - 1.0   Leukocytes,Ua NEGATIVE Negative   Nitrite NEGATIVE Negative   WBC, UA 0-2/hpf 0-2/hpf    RBC / HPF none seen 0-2/hpf   Squamous Epithelial / HPF Rare(0-4/hpf) Rare(0-4/hpf)  The 10-year ASCVD risk score (Arnett DK, et al., 2019) is: 11.2%    Assessment & Plan:   Healthcare maintenance  Essential hypertension -     CBC -     Urinalysis, Routine w reflex microscopic  High triglycerides -     Lipid panel  Prediabetes -     Hemoglobin A1c -     Basic metabolic panel  Cigarette smoker  Alcohol use  Need for vaccination -     Flu vaccine trivalent PF, 6mos and older(Flulaval,Afluria,Fluarix,Fluzone)  Screening for prostate cancer -     PSA  Hypokalemia -     Potassium Chloride Crys ER; TAKE 1 TABLET(20 MEQ) BY MOUTH DAILY  Dispense: 90 tablet; Refill: 2 -     Potassium; Future    Return in about 6 months (around 07/01/2023).  Again strongly encouraged smoking cessation.  Remarked that it was not moving air in and out of his lungs efficiently.  Quitting smoking would help.  Information was given in [place] about smoking cessation.  Asked him to drink no more than 1 beer daily.  Mliss Sax, MD

## 2022-12-31 NOTE — Addendum Note (Signed)
Addended by: Andrez Grime on: 12/31/2022 03:13 PM   Modules accepted: Orders

## 2023-01-01 ENCOUNTER — Encounter: Payer: Commercial Managed Care - HMO | Admitting: Gastroenterology

## 2023-01-07 ENCOUNTER — Ambulatory Visit
Admission: RE | Admit: 2023-01-07 | Discharge: 2023-01-07 | Disposition: A | Discharge status: Home / Self Care | Payer: Managed Care, Other (non HMO) | Source: Ambulatory Visit | Attending: Family Medicine | Admitting: Family Medicine

## 2023-01-07 DIAGNOSIS — Z87891 Personal history of nicotine dependence: Secondary | ICD-10-CM

## 2023-01-07 DIAGNOSIS — F17210 Nicotine dependence, cigarettes, uncomplicated: Secondary | ICD-10-CM

## 2023-01-07 DIAGNOSIS — Z122 Encounter for screening for malignant neoplasm of respiratory organs: Secondary | ICD-10-CM

## 2023-01-23 ENCOUNTER — Telehealth: Payer: Self-pay | Admitting: Acute Care

## 2023-01-23 NOTE — Telephone Encounter (Signed)
Confirmed call report received:  IMPRESSION: Lung-RADS 4A, suspicious. Interval development of new nodularity in the posterior left lower lobe associated with bronchiectatic airways. 7.5 mm nodule and adjacent 7.6 mm nodule are probably mucoid airway impaction. After therapy, follow up low-dose chest CT without contrast in 3 months (please use the following order, "CT CHEST LCS NODULE FOLLOW-UP W/O CM") is recommended. Alternatively, PET may be considered when there is a solid component 8mm or larger.   Aortic Atherosclerosis (ICD10-I70.0) and Emphysema (ICD10-J43.9).

## 2023-01-23 NOTE — Telephone Encounter (Signed)
Lung cancer screening from October 29th

## 2023-01-24 ENCOUNTER — Telehealth: Payer: Self-pay | Admitting: Acute Care

## 2023-01-24 NOTE — Telephone Encounter (Signed)
I have attempted to call the patient with the results of their  Low Dose CT Chest Lung cancer screening scan. There was no answer. I have left a HIPPA compliant VM requesting the patient call the office for the scan results. I included the office contact information in the message. We will await his return call. If no return call we will continue to call until patient is contacted.   Ladies, this patient's scan looks like it could be mucoid impaction.O  Please call him and ask him if he has been sick. Ask him if he has had additional secretions compared to his baseline.  We will just do a 3 month follow up from the date the scan was done. If he has been sick, make sure he goes to his PCP for treatment if he is still symptomatic before the next scan. Thanks so much.  Once patient has been reached , please send results to PCP, Order 3 month follow up CT Chest due after 04/13/2023, and  let them know plan of care,  Thanks so much

## 2023-01-25 NOTE — Telephone Encounter (Signed)
Attempt to call. Phone rang several times then was disconnected.

## 2023-01-29 NOTE — Telephone Encounter (Signed)
Letter to mychart to call for results of LDCT

## 2023-01-30 ENCOUNTER — Telehealth: Payer: Self-pay | Admitting: Acute Care

## 2023-01-30 DIAGNOSIS — R911 Solitary pulmonary nodule: Secondary | ICD-10-CM

## 2023-01-30 NOTE — Telephone Encounter (Signed)
I have attempted to call the patient again regarding the results of his low-dose screening CT.  There is no answer and there is no option for leaving a message as once the phone rings it then goes to a fast busy signal. As the patient has had an abnormal screening scan and we are unable to get in touch with him I did reach out to his emergency contact. She stated that the patient is out of the country in [place] visiting his parents.  She confirmed that he will be back on December 13 and that we should be able to get in touch with him at that time. Plan is for a 107-month follow-up that will be due after April 13, 2023. Sherre Lain, Robynn Pane, and Revonda Standard, please call patient after 12/13 to make sure he received the letter, and to make sure he understands the importance of follow up after 04/13/2023. Thanks so much

## 2023-02-01 NOTE — Telephone Encounter (Signed)
Order placed for 3 month follow up scan. Will hold message in box to call patient on 12/14.

## 2023-03-04 NOTE — Telephone Encounter (Signed)
Patient returned call and LVM advises he received his letter. Returned call, no VM setup. Call drops to busy signal. Will try again later.

## 2023-03-04 NOTE — Telephone Encounter (Signed)
Spoke with patient by phone to review results of recent LDCT. Patient states he had been out of the country and couldn't be reached from our previous calls.  Discussed results showing new lung nodules with recommendation to repeat CT in 3 months.  Patient denies noting any recent changes in lung congestion or signs of illness.  He is in agreement with plan to follow up CT and was scheduled for 04/10/2023 for imaging appt.  Atherosclerosis and emphysema, as previously noted.  Results/plan faxed to PCP.

## 2023-03-28 ENCOUNTER — Other Ambulatory Visit: Payer: Self-pay

## 2023-03-28 ENCOUNTER — Observation Stay (HOSPITAL_COMMUNITY)
Admission: EM | Admit: 2023-03-28 | Discharge: 2023-03-30 | Disposition: A | Discharge status: Home / Self Care | Payer: Commercial Managed Care - HMO | Attending: Internal Medicine | Admitting: Internal Medicine

## 2023-03-28 ENCOUNTER — Encounter (HOSPITAL_COMMUNITY): Payer: Self-pay

## 2023-03-28 ENCOUNTER — Emergency Department (HOSPITAL_COMMUNITY): Payer: Commercial Managed Care - HMO

## 2023-03-28 ENCOUNTER — Ambulatory Visit: Payer: Self-pay | Admitting: Family Medicine

## 2023-03-28 DIAGNOSIS — Z79899 Other long term (current) drug therapy: Secondary | ICD-10-CM | POA: Diagnosis not present

## 2023-03-28 DIAGNOSIS — B1910 Unspecified viral hepatitis B without hepatic coma: Secondary | ICD-10-CM | POA: Diagnosis present

## 2023-03-28 DIAGNOSIS — R079 Chest pain, unspecified: Secondary | ICD-10-CM | POA: Diagnosis present

## 2023-03-28 DIAGNOSIS — D62 Acute posthemorrhagic anemia: Secondary | ICD-10-CM | POA: Diagnosis not present

## 2023-03-28 DIAGNOSIS — D696 Thrombocytopenia, unspecified: Secondary | ICD-10-CM | POA: Diagnosis present

## 2023-03-28 DIAGNOSIS — D500 Iron deficiency anemia secondary to blood loss (chronic): Secondary | ICD-10-CM | POA: Insufficient documentation

## 2023-03-28 DIAGNOSIS — R7989 Other specified abnormal findings of blood chemistry: Secondary | ICD-10-CM | POA: Diagnosis not present

## 2023-03-28 DIAGNOSIS — R55 Syncope and collapse: Secondary | ICD-10-CM | POA: Diagnosis not present

## 2023-03-28 DIAGNOSIS — K7030 Alcoholic cirrhosis of liver without ascites: Secondary | ICD-10-CM

## 2023-03-28 DIAGNOSIS — K7460 Unspecified cirrhosis of liver: Secondary | ICD-10-CM | POA: Diagnosis not present

## 2023-03-28 DIAGNOSIS — F17210 Nicotine dependence, cigarettes, uncomplicated: Secondary | ICD-10-CM | POA: Diagnosis not present

## 2023-03-28 DIAGNOSIS — K2951 Unspecified chronic gastritis with bleeding: Principal | ICD-10-CM | POA: Insufficient documentation

## 2023-03-28 DIAGNOSIS — K2101 Gastro-esophageal reflux disease with esophagitis, with bleeding: Secondary | ICD-10-CM | POA: Diagnosis not present

## 2023-03-28 DIAGNOSIS — K922 Gastrointestinal hemorrhage, unspecified: Principal | ICD-10-CM

## 2023-03-28 DIAGNOSIS — I10 Essential (primary) hypertension: Secondary | ICD-10-CM | POA: Diagnosis not present

## 2023-03-28 DIAGNOSIS — S0990XA Unspecified injury of head, initial encounter: Secondary | ICD-10-CM

## 2023-03-28 DIAGNOSIS — K921 Melena: Secondary | ICD-10-CM | POA: Diagnosis not present

## 2023-03-28 DIAGNOSIS — F1010 Alcohol abuse, uncomplicated: Secondary | ICD-10-CM | POA: Diagnosis not present

## 2023-03-28 DIAGNOSIS — B181 Chronic viral hepatitis B without delta-agent: Secondary | ICD-10-CM

## 2023-03-28 DIAGNOSIS — K259 Gastric ulcer, unspecified as acute or chronic, without hemorrhage or perforation: Secondary | ICD-10-CM | POA: Diagnosis not present

## 2023-03-28 DIAGNOSIS — K920 Hematemesis: Secondary | ICD-10-CM | POA: Diagnosis present

## 2023-03-28 DIAGNOSIS — S0181XA Laceration without foreign body of other part of head, initial encounter: Secondary | ICD-10-CM

## 2023-03-28 DIAGNOSIS — B169 Acute hepatitis B without delta-agent and without hepatic coma: Secondary | ICD-10-CM | POA: Insufficient documentation

## 2023-03-28 LAB — HEMOGLOBIN AND HEMATOCRIT, BLOOD
HCT: 34.1 % — ABNORMAL LOW (ref 39.0–52.0)
HCT: 27.3 % — ABNORMAL LOW (ref 39.0–52.0)
Hemoglobin: 12 g/dL — ABNORMAL LOW (ref 13.0–17.0)
Hemoglobin: 9.5 g/dL — ABNORMAL LOW (ref 13.0–17.0)

## 2023-03-28 LAB — COMPREHENSIVE METABOLIC PANEL
ALT: 44 U/L (ref 0–44)
AST: 51 U/L — ABNORMAL HIGH (ref 15–41)
Albumin: 2.9 g/dL — ABNORMAL LOW (ref 3.5–5.0)
Alkaline Phosphatase: 122 U/L (ref 38–126)
Anion gap: 11 (ref 5–15)
BUN: 32 mg/dL — ABNORMAL HIGH (ref 6–20)
CO2: 26 mmol/L (ref 22–32)
Calcium: 9 mg/dL (ref 8.9–10.3)
Chloride: 98 mmol/L (ref 98–111)
Creatinine, Ser: 0.9 mg/dL (ref 0.61–1.24)
GFR, Estimated: >60 mL/min (ref >60)
Glucose, Bld: 266 mg/dL — ABNORMAL HIGH (ref 70–99)
Potassium: 3.9 mmol/L (ref 3.5–5.1)
Sodium: 135 mmol/L (ref 135–145)
Total Bilirubin: 0.8 mg/dL (ref 0.0–1.2)
Total Protein: 6.2 g/dL — ABNORMAL LOW (ref 6.5–8.1)

## 2023-03-28 LAB — TYPE AND SCREEN
ABO/RH(D): O POS
Antibody Screen: NEGATIVE

## 2023-03-28 LAB — CBC
HCT: 36.3 % — ABNORMAL LOW (ref 39.0–52.0)
Hemoglobin: 12.5 g/dL — ABNORMAL LOW (ref 13.0–17.0)
MCH: 35.4 pg — ABNORMAL HIGH (ref 26.0–34.0)
MCHC: 34.4 g/dL (ref 30.0–36.0)
MCV: 102.8 fL — ABNORMAL HIGH (ref 80.0–100.0)
Platelets: 113 10*3/uL — ABNORMAL LOW (ref 150–400)
RBC: 3.53 MIL/uL — ABNORMAL LOW (ref 4.22–5.81)
RDW: 13.2 % (ref 11.5–15.5)
WBC: 11.1 10*3/uL — ABNORMAL HIGH (ref 4.0–10.5)
nRBC: 0 % (ref 0.0–0.2)

## 2023-03-28 LAB — TROPONIN I (HIGH SENSITIVITY)
Troponin I (High Sensitivity): 53 ng/L — ABNORMAL HIGH
Troponin I (High Sensitivity): 50 ng/L — ABNORMAL HIGH (ref <18)

## 2023-03-28 LAB — HEMOGLOBIN A1C
Hgb A1c MFr Bld: 5.4 % (ref 4.8–5.6)
Mean Plasma Glucose: 108.28 mg/dL

## 2023-03-28 LAB — ETHANOL: Alcohol, Ethyl (B): <10 mg/dL (ref <10)

## 2023-03-28 LAB — PROTIME-INR
INR: 1.2 (ref 0.8–1.2)
Prothrombin Time: 15.8 s — ABNORMAL HIGH (ref 11.4–15.2)

## 2023-03-28 LAB — HIV ANTIBODY (ROUTINE TESTING W REFLEX): HIV Screen 4th Generation wRfx: NONREACTIVE

## 2023-03-28 MED ORDER — SODIUM CHLORIDE 0.9% FLUSH
3.0000 mL | Freq: Two times a day (BID) | INTRAVENOUS | Status: DC
  Administered 2023-03-28 (×2): 3 mL via INTRAVENOUS

## 2023-03-28 MED ORDER — PANTOPRAZOLE SODIUM 40 MG IV SOLR
40.0000 mg | Freq: Two times a day (BID) | INTRAVENOUS | Status: DC
  Administered 2023-03-29: 40 mg via INTRAVENOUS
  Filled 2023-03-28: qty 10

## 2023-03-28 MED ORDER — ACETAMINOPHEN 325 MG PO TABS: 650.0000 mg | ORAL_TABLET | Freq: Four times a day (QID) | ORAL | Status: DC | PRN

## 2023-03-28 MED ORDER — ACETAMINOPHEN 650 MG RE SUPP: 650.0000 mg | Freq: Four times a day (QID) | RECTAL | Status: DC | PRN

## 2023-03-28 MED ORDER — THIAMINE MONONITRATE 100 MG PO TABS
100.0000 mg | ORAL_TABLET | Freq: Every day | ORAL | Status: DC
  Administered 2023-03-28 – 2023-03-30 (×3): 100 mg via ORAL
  Filled 2023-03-28 (×3): qty 1

## 2023-03-28 MED ORDER — LIDOCAINE-EPINEPHRINE (PF) 2 %-1:200000 IJ SOLN
10.0000 mL | Freq: Once | INTRAMUSCULAR | Status: DC
  Filled 2023-03-28: qty 20

## 2023-03-28 MED ORDER — LORAZEPAM 1 MG PO TABS: 1.0000 mg | ORAL_TABLET | ORAL | Status: DC | PRN

## 2023-03-28 MED ORDER — FOLIC ACID 1 MG PO TABS
1.0000 mg | ORAL_TABLET | Freq: Every day | ORAL | Status: DC
  Administered 2023-03-28 – 2023-03-30 (×3): 1 mg via ORAL
  Filled 2023-03-28 (×3): qty 1

## 2023-03-28 MED ORDER — SODIUM CHLORIDE 0.9 % IV SOLN
50.0000 ug/h | INTRAVENOUS | Status: DC
  Administered 2023-03-28 – 2023-03-29 (×2): 50 ug/h via INTRAVENOUS
  Filled 2023-03-28 (×2): qty 1

## 2023-03-28 MED ORDER — SODIUM CHLORIDE 0.9 % IV SOLN
1.0000 g | INTRAVENOUS | Status: DC
  Administered 2023-03-28: 1 g via INTRAVENOUS
  Filled 2023-03-28: qty 10

## 2023-03-28 MED ORDER — ALBUTEROL SULFATE (2.5 MG/3ML) 0.083% IN NEBU: 2.5000 mg | INHALATION_SOLUTION | Freq: Four times a day (QID) | RESPIRATORY_TRACT | Status: DC | PRN

## 2023-03-28 MED ORDER — IOHEXOL 350 MG/ML SOLN
60.0000 mL | Freq: Once | INTRAVENOUS | Status: AC | PRN
  Administered 2023-03-28: 60 mL via INTRAVENOUS

## 2023-03-28 MED ORDER — TENOFOVIR DISOPROXIL FUMARATE 300 MG PO TABS
300.0000 mg | ORAL_TABLET | Freq: Every day | ORAL | Status: DC
  Administered 2023-03-28 – 2023-03-30 (×3): 300 mg via ORAL
  Filled 2023-03-28 (×3): qty 1

## 2023-03-28 MED ORDER — ADULT MULTIVITAMIN W/MINERALS CH
1.0000 | ORAL_TABLET | Freq: Every day | ORAL | Status: DC
  Administered 2023-03-28 – 2023-03-30 (×3): 1 via ORAL
  Filled 2023-03-28 (×3): qty 1

## 2023-03-28 MED ORDER — OCTREOTIDE LOAD VIA INFUSION
50.0000 ug | Freq: Once | INTRAVENOUS | Status: AC
  Administered 2023-03-28: 50 ug via INTRAVENOUS
  Filled 2023-03-28: qty 25

## 2023-03-28 MED ORDER — SODIUM CHLORIDE 0.9 % IV BOLUS
1000.0000 mL | Freq: Once | INTRAVENOUS | Status: AC
Start: 2023-03-28 — End: 2023-03-29
  Administered 2023-03-28: 1000 mL via INTRAVENOUS

## 2023-03-28 MED ORDER — LORAZEPAM 2 MG/ML IJ SOLN: 1.0000 mg | INTRAMUSCULAR | Status: DC | PRN

## 2023-03-28 MED ORDER — ONDANSETRON HCL 4 MG PO TABS: 4.0000 mg | ORAL_TABLET | Freq: Four times a day (QID) | ORAL | Status: DC | PRN

## 2023-03-28 MED ORDER — GEMFIBROZIL 600 MG PO TABS
600.0000 mg | ORAL_TABLET | Freq: Two times a day (BID) | ORAL | Status: DC
  Administered 2023-03-29 – 2023-03-30 (×3): 600 mg via ORAL
  Filled 2023-03-28 (×4): qty 1

## 2023-03-28 MED ORDER — NICOTINE 21 MG/24HR TD PT24
21.0000 mg | MEDICATED_PATCH | Freq: Every day | TRANSDERMAL | Status: DC
  Administered 2023-03-28 – 2023-03-30 (×2): 21 mg via TRANSDERMAL
  Filled 2023-03-28 (×3): qty 1

## 2023-03-28 MED ORDER — THIAMINE HCL 100 MG/ML IJ SOLN: 100.0000 mg | Freq: Every day | INTRAMUSCULAR | Status: DC

## 2023-03-28 MED ORDER — ONDANSETRON HCL 4 MG/2ML IJ SOLN: 4.0000 mg | Freq: Four times a day (QID) | INTRAMUSCULAR | Status: DC | PRN

## 2023-03-28 MED ORDER — PANTOPRAZOLE SODIUM 40 MG IV SOLR
40.0000 mg | INTRAVENOUS | Status: AC
  Administered 2023-03-28 (×2): 40 mg via INTRAVENOUS
  Filled 2023-03-28 (×2): qty 10

## 2023-03-28 NOTE — Telephone Encounter (Signed)
  Chief Complaint: Vomiting Symptoms: vomiting coffee ground emesis, discomfort in chest, feeling tired dark stools Frequency: began 0200 this morning Pertinent Negatives: Patient denies blood in stool, dizziness, difficulty urinating Disposition: [x] ED /[] Urgent Care (no appt availability in office) / [] Appointment(In office/virtual)/ []  [facility]/ [] Home Care/ [] Refused Recommended Disposition /[] [facility]/ []  Follow-up with PCP Additional Notes: Patient calls stating he woke up around two this morning not feeling well. Reports he felt like he had heartburn- states when he got up to brush his teeth he vomited approx 1 cup of fluid that looked like coffee grounds, dark black. Patient also reports that his stools are dark black, solid in nature. He states he feels like he has air in his chest, denies pain. Denies blood thinner, dizziness, difficulty urinating. Per protocol, patient to call 911 now. Patient declines dispo. This RN asked if patient would be willing to have someone drive him to the ER, patient declined. Care advice reviewed. Alerting PCP for review and follow-up.    Copied from CRM [id number]. Topic: Clinical - Red Word Triage >> Mar 28, 2023  8:58 AM Rosina BIRCH wrote: Red Word that prompted transfer to Nurse Triage: heartburn, dizzy and throwing up Reason for Disposition  [1] Vomited blood AND [2] large amount (example: a cup of blood)  Answer Assessment - Initial Assessment Questions 1. APPEARANCE of BLOOD: What does the blood look like? (e.g., pink, red blood, coffee-grounds)     Coffee grounds 2. AMOUNT: How much blood was lost? (e.g., few streaks or strands, tablespoon, cup)     Cup 3. VOMITING BLOOD: How many times did it happen? or How many times in the past 24 hours?     Approx 0200 this morning 4. VOMITING WITHOUT BLOOD: How many times in the past 24 hours?      None, vomited one time dark 5. ONSET: When did vomiting of blood  begin?     This morning 6. CAUSE: What do you think is causing the vomiting of blood?     Unknown 7. BLOOD THINNERS: Do you take any blood thinners? (e.g., Coumadin/warfarin, Pradaxa/dabigatran, aspirin)     Denies 8. DEHYDRATION: Are there any signs of dehydration? When was the last time you urinated? Do you feel dizzy?     Feels tired 9. ABDOMEN PAIN: Are you having any abdomen pain? If Yes, ask: What does it feel like?  (e.g., crampy, dull, intermittent, constant)      Denies, states feels like air is coming up in chest 10. DIARRHEA: Is there any diarrhea? If Yes, ask: How many times today?        Denies 11. OTHER SYMPTOMS: Do you have any other symptoms? (e.g., fever, blood in stool)       Denies  Protocols used: Vomiting Blood-A-AH

## 2023-03-28 NOTE — ED Triage Notes (Signed)
 Pt c.o chest pain, hematemesis (black) and black stools since 7am this morning. Denies hx of same or taking and blood thinners. States he drinks 3 beers a day. Denies abd pain

## 2023-03-28 NOTE — ED Notes (Signed)
 Pt was brought to bathroom from triage by tech, pt locked door and wife was standing by door. Wife was concerned that pt was not speaking back to her and told this RN that he was not responding, I tried going in bathroom and the door was locked, I immediately contacted security to get door unlocked. Once unlocked pt was found in floor with pants pulled down emesis all over face and floor and bloody stools on the toilet, patient and bathroom floor. Upon assessing pt, there was a lac to upper right eye noticed and pt is A&OX4. Pt is unsure how he fell, he states he was sitting on toilet and got dizzy and needed to puke and then fell. MD notified and RN assigned to this patient notified.

## 2023-03-28 NOTE — H&P (View-Only) (Signed)
 Consultation  Referring Provider: [facility] Primary Care Physician:  Berneta Elsie Sayre, MD Primary Gastroenterologist:  Dr.Gupta  Reason for Consultation: Acute GI bleed, coffee-ground emesis, melena, fall with syncope in the ER  HPI: Gregory Singh is a 63 y.o. male, known to Dr. Charlanne from colonoscopy which had been done as an outpatient for screening in August 2024.  He had a poor prep, with some residual stool in the rectosigmoid, no other pertinent findings. 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  He has history of hepatitis B, chronic and has been on tenofovir , hypertension and hyperlipidemia. He relates no known history of underlying liver disease/cirrhosis.  No prior history of GI bleeding And is not on any regular aspirin or NSAIDs, no blood thinners.  He states that noticing black stools at home over the past 4 to 5 days but thought perhaps related to something he had eaten.  Early in the morning hours today he woke up nauseated and uncomfortable in his abdomen but without pain.  About 7:30 AM he vomited what he describes as black material perhaps half a cup or so.  He continued to have dark stools this morning, and had a large diarrheal stool while in the ER which is described as dark to avaya.  He did have a syncopal episode without event. CT angiography was done and shows no evidence of active extravasation, no acute inflammatory process, liver appears cirrhotic there is mild feeling of fullness and a few varices in the lower thoracic esophagus/GE junction area, no splenomegaly or ascites.  Labs with hemoglobin 12.5/hematocrit 36.5/platelets 113 Potassium 3.9/BUN 32/creatinine 0.9 LFTs with AST 51/ALT 44 INR 1.2/pro time 15.8 Troponin 50. Repeat hemoglobin 12 EtOH pending  Currently hemodynamically stable    Past Medical History:  Diagnosis Date    Hypertension     History reviewed. No pertinent surgical history.  Prior to Admission medications   Medication Sig Start Date End Date Taking? Authorizing Provider  b complex vitamins capsule Take 1 capsule by mouth daily.   Yes [provider]  clobetasol  ointment (TEMOVATE ) 0.05 % Apply bid to rash on leg liberally, do not use on face or genitals 12/27/21  Yes Berneta Elsie Sayre, MD  gemfibrozil  (LOPID ) 600 MG tablet TAKE 1 TABLET(600 MG) BY MOUTH TWICE DAILY 08/21/22  Yes Berneta Elsie Sayre, MD  hydrochlorothiazide  (HYDRODIURIL ) 25 MG tablet Take 1 tablet by mouth once daily 12/28/22  Yes Berneta Elsie Sayre, MD  Multiple Vitamins-Minerals (MULTIVITAMIN WITH IRON-MINERALS) liquid Take by mouth daily.   Yes [provider]  omega-3 acid ethyl esters (LOVAZA ) 1 g capsule Take 2 capsules (2 g total) by mouth daily. 01/15/17  Yes English, Corean D, PA  potassium chloride  SA (KLOR-CON  M) 20 MEQ tablet TAKE 1 TABLET(20 MEQ) BY MOUTH DAILY 12/31/22  Yes Berneta Elsie Sayre, MD  sildenafil  (VIAGRA ) 100 MG tablet Take 0.5-1 tablets (50-100 mg total) by mouth daily as needed for erectile dysfunction. 06/28/22  Yes Berneta Elsie Sayre, MD  tenofovir  (VIREAD ) 300 MG tablet Take 1 tablet (300 mg total) by mouth daily. 10/02/22  Yes Luiz Channel, MD    Current Facility-Administered Medications  Medication Dose Route Frequency Provider Last Rate Last Admin   acetaminophen  (TYLENOL ) tablet 650 mg  650 mg Oral Q6H PRN Smith, Rondell A, MD       Or   acetaminophen  (TYLENOL ) suppository 650 mg  650 mg Rectal Q6H PRN Smith, Rondell A, MD       albuterol  (PROVENTIL ) (2.5 MG/3ML) 0.083% nebulizer solution 2.5 mg  2.5 mg Nebulization Q6H PRN Claudene, Rondell A, MD       cefTRIAXone  (ROCEPHIN ) 1 g in sodium chloride  0.9 % 100 mL IVPB  1 g Intravenous Q24H Smith, Rondell A, MD       folic acid  (FOLVITE ) tablet 1 mg  1 mg Oral Daily Geiple, Joshua, PA-C        lidocaine -EPINEPHrine  (XYLOCAINE  W/EPI) 2 %-1:200000 (PF) injection 10 mL  10 mL Infiltration Once Desiderio Chew, PA-C       LORazepam  (ATIVAN ) tablet 1-4 mg  1-4 mg Oral Q1H PRN Geiple, Joshua, PA-C       Or   LORazepam  (ATIVAN ) injection 1-4 mg  1-4 mg Intravenous Q1H PRN Geiple, Joshua, PA-C       multivitamin with minerals tablet 1 tablet  1 tablet Oral Daily Geiple, Joshua, PA-C       octreotide  (SANDOSTATIN ) 2 mcg/mL load via infusion 50 mcg  50 mcg Intravenous Once Smith, Rondell A, MD       And   octreotide  (SANDOSTATIN ) 500 mcg in sodium chloride  0.9 % 250 mL (2 mcg/mL) infusion  50 mcg/hr Intravenous Continuous Smith, Rondell A, MD       ondansetron  (ZOFRAN ) tablet 4 mg  4 mg Oral Q6H PRN Smith, Rondell A, MD       Or   ondansetron  (ZOFRAN ) injection 4 mg  4 mg Intravenous Q6H PRN Smith, Rondell A, MD       [START ON 03/29/2023] pantoprazole  (PROTONIX ) injection 40 mg  40 mg Intravenous Q12H Desiderio Chew, PA-C       sodium chloride  flush (NS) 0.9 % injection 3 mL  3 mL Intravenous Q12H Smith, Rondell A, MD       thiamine  (VITAMIN B1) tablet 100 mg  100 mg Oral Daily Geiple, Joshua, PA-C       Or   thiamine  (VITAMIN B1) injection 100 mg  100 mg Intravenous Daily Geiple, Joshua, PA-C       Current Outpatient Medications  Medication Sig Dispense Refill   b complex vitamins capsule Take 1 capsule by mouth daily.     clobetasol  ointment (TEMOVATE ) 0.05 % Apply bid to rash on leg liberally, do not use on face or genitals 60 g 1   gemfibrozil  (LOPID ) 600 MG tablet TAKE 1 TABLET(600 MG) BY MOUTH TWICE DAILY 180 tablet 3   hydrochlorothiazide  (HYDRODIURIL ) 25 MG tablet Take 1 tablet by mouth once daily 90 tablet 0   Multiple Vitamins-Minerals (MULTIVITAMIN WITH IRON-MINERALS) liquid Take by mouth daily.     omega-3 acid ethyl esters (LOVAZA ) 1 g capsule Take 2 capsules (2 g total) by mouth daily. 30 capsule 2   potassium chloride  SA (KLOR-CON  M) 20 MEQ tablet TAKE 1 TABLET(20 MEQ) BY  MOUTH DAILY 90 tablet 2   sildenafil  (VIAGRA ) 100 MG tablet Take 0.5-1 tablets (50-100 mg total) by mouth daily as needed for erectile dysfunction. 20 tablet 6   tenofovir  (VIREAD ) 300 MG tablet Take 1 tablet (300 mg total) by mouth daily. 30 tablet 11    Allergies as of 03/28/2023   (No Known Allergies)    Family History  Problem Relation Age of Onset   Healthy Mother    Diabetes Father    Stroke Father    Colon cancer Neg Hx    Colon polyps Neg Hx    Esophageal  cancer Neg Hx    Rectal cancer Neg Hx    Stomach cancer Neg Hx     Social History   Socioeconomic History   Marital status: Married    Spouse name: Not on file   Number of children: Not on file   Years of education: Not on file   Highest education level: Not on file  Occupational History   Not on file  Tobacco Use   Smoking status: Every Day    Current packs/day: 0.25    Average packs/day: 0.3 packs/day for 43.0 years (10.8 ttl pk-yrs)    Types: Cigarettes    Start date: 03/19/1980   Smokeless tobacco: Never   Tobacco comments:    Patient reports 6 cigs a day.  Vaping Use   Vaping status: Never Used  Substance and Sexual Activity   Alcohol use: Yes    Alcohol/week: 24.0 standard drinks of alcohol    Types: 24 Standard drinks or equivalent per week    Comment: beer 2 daily   Drug use: No   Sexual activity: Yes  Other Topics Concern   Not on file  Social History Narrative   Not on file   Social Drivers of Health   Financial Resource Strain: Not on file  Food Insecurity: Not on file  Transportation Needs: Not on file  Physical Activity: Not on file  Stress: Not on file  Social Connections: Not on file  Intimate Partner Violence: Not on file    Review of Systems: Pertinent positive and negative review of systems were noted in the above HPI section.  All other review of systems was otherwise negative.   Physical Exam: Vital signs in last 24 hours: Temp:  [97.5 F (36.4 C)-98.3 F (36.8 C)]  98.3 F (36.8 C) (01/09 1254) Pulse Rate:  [90-127] 90 (01/09 1545) Resp:  [17-24] 17 (01/09 1545) BP: (98-132)/(62-87) 127/71 (01/09 1545) SpO2:  [97 %-100 %] 100 % (01/09 1545) Weight:  [59 kg] 59 kg (01/09 1047)   General:   Alert,  Well-developed, well-nourished,older asian male  pleasant and cooperative in NAD, family at bedside Head: Edema over the right eyebrow which has been sutured, mild bruising Eyes:  Sclera clear, no icterus.   Conjunctiva pink. Ears:  Normal auditory acuity. Nose:  No deformity, discharge,  or lesions. Mouth:  No deformity or lesions.   Neck:  Supple; no masses or thyromegaly. Lungs:  Clear throughout to auscultation.   No wheezes, crackles, or rhonchi.  Heart:  Regular rate and rhythm; no murmurs, clicks, rubs,  or gallops. Abdomen:  Soft,nontender, BS active,nonpalp mass or hsm. No fluid wave  Rectal:  melena per ER provider Msk:  Symmetrical without gross deformities. . Pulses:  Normal pulses noted. Extremities:  Without clubbing or edema. Neurologic:  Alert and  oriented x4;  grossly normal neurologically. Skin:  Intact without significant lesions or rashes.. Psych:  Alert and cooperative. Normal mood and affect.  Intake/Output from previous day: No intake/output data recorded. Intake/Output this shift: No intake/output data recorded.  Lab Results: Recent Labs    03/28/23 1053 03/28/23 1300  WBC 11.1*  --   HGB 12.5* 12.0*  HCT 36.3* 34.1*  PLT 113*  --    BMET Recent Labs    03/28/23 1053  NA 135  K 3.9  CL 98  CO2 26  GLUCOSE 266*  BUN 32*  CREATININE 0.90  CALCIUM  9.0   LFT Recent Labs    03/28/23 1053  PROT 6.2*  ALBUMIN 2.9*  AST 51*  ALT 44  ALKPHOS 122  BILITOT 0.8   PT/INR Recent Labs    03/28/23 1300  LABPROT 15.8*  INR 1.2   Hepatitis Panel No results for input(s): HEPBSAG, HCVAB, HEPAIGM, HEPBIGM in the last 72 hours.   IMPRESSION:  #45 63 year old Asian male presenting with acute/subacute  upper GI bleed with melena at home over the past 4 to 5 days, onset of nausea and then coffee-ground emesis at home this morning followed by 2-3 melenic stools and a large diarrhea melenic/maroon stool in the ER. Syncopal episode with the bowel movement in the ER  CT angiography negative for active extravasation, does show cirrhotic appearing liver and fullness and varices in the distal esophagus/GE junction otherwise unremarkable no ascites and normal-appearing spleen  Last hemoglobin 12 INR 1.2  Patient has not had any prior episodes of GI bleeding.  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 after midnight Agree with IV Rocephin  and IV octreotide   PPI twice daily Serial hemoglobins every 4-6 hours, and transfuse as indicated. Patient will be scheduled for EGD with Dr. Anyra Kaufman in a.m. tomorrow 03/29/2023.  Procedure was discussed in detail with the patient and his family member in detail including indications risk benefits and he is agreeable to proceed. GI will follow closely with you    Amy Esterwood PA-C 03/28/2023, 4:03 PM   Attending physician's note  I have taken a history, reviewed the chart and examined the patient. I performed a substantive portion of this encounter, including complete performance of at least one of the key components, in conjunction with the APP. I agree with the APP's note, impression and recommendations.   This 63 year old very pleasant male with history of chronic hep B on tenofovir , daily alcohol use, compensated cirrhosis admitted with coffee-ground emesis and melena concerning for acute upper GI bleed.  Compensated cirrhosis, no  ascites  Noted varices GE junction on CT, no active extravasation  Plan to proceed with EGD for further evaluation and endoscopic therapeutic intervention as needed Continue octreotide  gtt. PPI IV twice daily IV ceftriaxone   Monitor hemoglobin and transfuse if below 7, avoid over transfusion N.p.o. after midnight  The patient was provided an opportunity to ask questions and all were answered. The patient agreed with the plan and demonstrated an understanding of the instructions.  LOIS Wilkie Mcgee , MD [phone]

## 2023-03-28 NOTE — Telephone Encounter (Signed)
 Called to speak with [facility] regarding symptoms.

## 2023-03-28 NOTE — Consult Note (Addendum)
 Consultation  Referring Provider: ERMD/MC Primary Care Physician:  Berneta Elsie Sayre, MD Primary Gastroenterologist:  Dr.Gupta  Reason for Consultation: Acute GI bleed, coffee-ground emesis, melena, fall with syncope in the ER  HPI: Gregory Singh is a 63 y.o. male, known to Dr. Charlanne from colonoscopy which had been done as an outpatient for screening in August 2024.  He had a poor prep, with some residual stool in the rectosigmoid, no other pertinent findings. 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  He has history of hepatitis B, chronic and has been on tenofovir , hypertension and hyperlipidemia. He relates no known history of underlying liver disease/cirrhosis.  No prior history of GI bleeding And is not on any regular aspirin or NSAIDs, no blood thinners.  He states that noticing black stools at home over the past 4 to 5 days but thought perhaps related to something he had eaten.  Early in the morning hours today he woke up nauseated and uncomfortable in his abdomen but without pain.  About 7:30 AM he vomited what he describes as black material perhaps half a cup or so.  He continued to have dark stools this morning, and had a large diarrheal stool while in the ER which is described as dark to avaya.  He did have a syncopal episode without event. CT angiography was done and shows no evidence of active extravasation, no acute inflammatory process, liver appears cirrhotic there is mild feeling of fullness and a few varices in the lower thoracic esophagus/GE junction area, no splenomegaly or ascites.  Labs with hemoglobin 12.5/hematocrit 36.5/platelets 113 Potassium 3.9/BUN 32/creatinine 0.9 LFTs with AST 51/ALT 44 INR 1.2/pro time 15.8 Troponin 50. Repeat hemoglobin 12 EtOH pending  Currently hemodynamically stable    Past Medical History:  Diagnosis Date    Hypertension     History reviewed. No pertinent surgical history.  Prior to Admission medications   Medication Sig Start Date End Date Taking? Authorizing Provider  b complex vitamins capsule Take 1 capsule by mouth daily.   Yes [provider]  clobetasol  ointment (TEMOVATE ) 0.05 % Apply bid to rash on leg liberally, do not use on face or genitals 12/27/21  Yes Berneta Elsie Sayre, MD  gemfibrozil  (LOPID ) 600 MG tablet TAKE 1 TABLET(600 MG) BY MOUTH TWICE DAILY 08/21/22  Yes Berneta Elsie Sayre, MD  hydrochlorothiazide  (HYDRODIURIL ) 25 MG tablet Take 1 tablet by mouth once daily 12/28/22  Yes Berneta Elsie Sayre, MD  Multiple Vitamins-Minerals (MULTIVITAMIN WITH IRON-MINERALS) liquid Take by mouth daily.   Yes [provider]  omega-3 acid ethyl esters (LOVAZA ) 1 g capsule Take 2 capsules (2 g total) by mouth daily. 01/15/17  Yes English, Corean D, PA  potassium chloride  SA (KLOR-CON  M) 20 MEQ tablet TAKE 1 TABLET(20 MEQ) BY MOUTH DAILY 12/31/22  Yes Berneta Elsie Sayre, MD  sildenafil  (VIAGRA ) 100 MG tablet Take 0.5-1 tablets (50-100 mg total) by mouth daily as needed for erectile dysfunction. 06/28/22  Yes Berneta Elsie Sayre, MD  tenofovir  (VIREAD ) 300 MG tablet Take 1 tablet (300 mg total) by mouth daily. 10/02/22  Yes Luiz Channel, MD    Current Facility-Administered Medications  Medication Dose Route Frequency Provider Last Rate Last Admin   acetaminophen  (TYLENOL ) tablet 650 mg  650 mg Oral Q6H PRN Smith, Rondell A, MD       Or   acetaminophen  (TYLENOL ) suppository 650 mg  650 mg Rectal Q6H PRN Smith, Rondell A, MD       albuterol  (PROVENTIL ) (2.5 MG/3ML) 0.083% nebulizer solution 2.5 mg  2.5 mg Nebulization Q6H PRN Claudene, Rondell A, MD       cefTRIAXone  (ROCEPHIN ) 1 g in sodium chloride  0.9 % 100 mL IVPB  1 g Intravenous Q24H Smith, Rondell A, MD       folic acid  (FOLVITE ) tablet 1 mg  1 mg Oral Daily Geiple, Joshua, PA-C        lidocaine -EPINEPHrine  (XYLOCAINE  W/EPI) 2 %-1:200000 (PF) injection 10 mL  10 mL Infiltration Once Desiderio Chew, PA-C       LORazepam  (ATIVAN ) tablet 1-4 mg  1-4 mg Oral Q1H PRN Geiple, Joshua, PA-C       Or   LORazepam  (ATIVAN ) injection 1-4 mg  1-4 mg Intravenous Q1H PRN Geiple, Joshua, PA-C       multivitamin with minerals tablet 1 tablet  1 tablet Oral Daily Geiple, Joshua, PA-C       octreotide  (SANDOSTATIN ) 2 mcg/mL load via infusion 50 mcg  50 mcg Intravenous Once Smith, Rondell A, MD       And   octreotide  (SANDOSTATIN ) 500 mcg in sodium chloride  0.9 % 250 mL (2 mcg/mL) infusion  50 mcg/hr Intravenous Continuous Smith, Rondell A, MD       ondansetron  (ZOFRAN ) tablet 4 mg  4 mg Oral Q6H PRN Smith, Rondell A, MD       Or   ondansetron  (ZOFRAN ) injection 4 mg  4 mg Intravenous Q6H PRN Smith, Rondell A, MD       [START ON 03/29/2023] pantoprazole  (PROTONIX ) injection 40 mg  40 mg Intravenous Q12H Desiderio Chew, PA-C       sodium chloride  flush (NS) 0.9 % injection 3 mL  3 mL Intravenous Q12H Smith, Rondell A, MD       thiamine  (VITAMIN B1) tablet 100 mg  100 mg Oral Daily Geiple, Joshua, PA-C       Or   thiamine  (VITAMIN B1) injection 100 mg  100 mg Intravenous Daily Geiple, Joshua, PA-C       Current Outpatient Medications  Medication Sig Dispense Refill   b complex vitamins capsule Take 1 capsule by mouth daily.     clobetasol  ointment (TEMOVATE ) 0.05 % Apply bid to rash on leg liberally, do not use on face or genitals 60 g 1   gemfibrozil  (LOPID ) 600 MG tablet TAKE 1 TABLET(600 MG) BY MOUTH TWICE DAILY 180 tablet 3   hydrochlorothiazide  (HYDRODIURIL ) 25 MG tablet Take 1 tablet by mouth once daily 90 tablet 0   Multiple Vitamins-Minerals (MULTIVITAMIN WITH IRON-MINERALS) liquid Take by mouth daily.     omega-3 acid ethyl esters (LOVAZA ) 1 g capsule Take 2 capsules (2 g total) by mouth daily. 30 capsule 2   potassium chloride  SA (KLOR-CON  M) 20 MEQ tablet TAKE 1 TABLET(20 MEQ) BY  MOUTH DAILY 90 tablet 2   sildenafil  (VIAGRA ) 100 MG tablet Take 0.5-1 tablets (50-100 mg total) by mouth daily as needed for erectile dysfunction. 20 tablet 6   tenofovir  (VIREAD ) 300 MG tablet Take 1 tablet (300 mg total) by mouth daily. 30 tablet 11    Allergies as of 03/28/2023   (No Known Allergies)    Family History  Problem Relation Age of Onset   Healthy Mother    Diabetes Father    Stroke Father    Colon cancer Neg Hx    Colon polyps Neg Hx    Esophageal  cancer Neg Hx    Rectal cancer Neg Hx    Stomach cancer Neg Hx     Social History   Socioeconomic History   Marital status: Married    Spouse name: Not on file   Number of children: Not on file   Years of education: Not on file   Highest education level: Not on file  Occupational History   Not on file  Tobacco Use   Smoking status: Every Day    Current packs/day: 0.25    Average packs/day: 0.3 packs/day for 43.0 years (10.8 ttl pk-yrs)    Types: Cigarettes    Start date: 03/19/1980   Smokeless tobacco: Never   Tobacco comments:    Patient reports 6 cigs a day.  Vaping Use   Vaping status: Never Used  Substance and Sexual Activity   Alcohol use: Yes    Alcohol/week: 24.0 standard drinks of alcohol    Types: 24 Standard drinks or equivalent per week    Comment: beer 2 daily   Drug use: No   Sexual activity: Yes  Other Topics Concern   Not on file  Social History Narrative   Not on file   Social Drivers of Health   Financial Resource Strain: Not on file  Food Insecurity: Not on file  Transportation Needs: Not on file  Physical Activity: Not on file  Stress: Not on file  Social Connections: Not on file  Intimate Partner Violence: Not on file    Review of Systems: Pertinent positive and negative review of systems were noted in the above HPI section.  All other review of systems was otherwise negative.   Physical Exam: Vital signs in last 24 hours: Temp:  [97.5 F (36.4 C)-98.3 F (36.8 C)]  98.3 F (36.8 C) (01/09 1254) Pulse Rate:  [90-127] 90 (01/09 1545) Resp:  [17-24] 17 (01/09 1545) BP: (98-132)/(62-87) 127/71 (01/09 1545) SpO2:  [97 %-100 %] 100 % (01/09 1545) Weight:  [59 kg] 59 kg (01/09 1047)   General:   Alert,  Well-developed, well-nourished,older asian male  pleasant and cooperative in NAD, family at bedside Head: Edema over the right eyebrow which has been sutured, mild bruising Eyes:  Sclera clear, no icterus.   Conjunctiva pink. Ears:  Normal auditory acuity. Nose:  No deformity, discharge,  or lesions. Mouth:  No deformity or lesions.   Neck:  Supple; no masses or thyromegaly. Lungs:  Clear throughout to auscultation.   No wheezes, crackles, or rhonchi.  Heart:  Regular rate and rhythm; no murmurs, clicks, rubs,  or gallops. Abdomen:  Soft,nontender, BS active,nonpalp mass or hsm. No fluid wave  Rectal:  melena per ER provider Msk:  Symmetrical without gross deformities. . Pulses:  Normal pulses noted. Extremities:  Without clubbing or edema. Neurologic:  Alert and  oriented x4;  grossly normal neurologically. Skin:  Intact without significant lesions or rashes.. Psych:  Alert and cooperative. Normal mood and affect.  Intake/Output from previous day: No intake/output data recorded. Intake/Output this shift: No intake/output data recorded.  Lab Results: Recent Labs    03/28/23 1053 03/28/23 1300  WBC 11.1*  --   HGB 12.5* 12.0*  HCT 36.3* 34.1*  PLT 113*  --    BMET Recent Labs    03/28/23 1053  NA 135  K 3.9  CL 98  CO2 26  GLUCOSE 266*  BUN 32*  CREATININE 0.90  CALCIUM  9.0   LFT Recent Labs    03/28/23 1053  PROT 6.2*  ALBUMIN 2.9*  AST 51*  ALT 44  ALKPHOS 122  BILITOT 0.8   PT/INR Recent Labs    03/28/23 1300  LABPROT 15.8*  INR 1.2   Hepatitis Panel No results for input(s): HEPBSAG, HCVAB, HEPAIGM, HEPBIGM in the last 72 hours.   IMPRESSION:  #45 63 year old Asian male presenting with acute/subacute  upper GI bleed with melena at home over the past 4 to 5 days, onset of nausea and then coffee-ground emesis at home this morning followed by 2-3 melenic stools and a large diarrhea melenic/maroon stool in the ER. Syncopal episode with the bowel movement in the ER  CT angiography negative for active extravasation, does show cirrhotic appearing liver and fullness and varices in the distal esophagus/GE junction otherwise unremarkable no ascites and normal-appearing spleen  Last hemoglobin 12 INR 1.2  Patient has not had any prior episodes of GI bleeding.  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 after midnight Agree with IV Rocephin  and IV octreotide   PPI twice daily Serial hemoglobins every 4-6 hours, and transfuse as indicated. Patient will be scheduled for EGD with Dr. Anyra Kaufman in a.m. tomorrow 03/29/2023.  Procedure was discussed in detail with the patient and his family member in detail including indications risk benefits and he is agreeable to proceed. GI will follow closely with you    Amy Esterwood PA-C 03/28/2023, 4:03 PM   Attending physician's note  I have taken a history, reviewed the chart and examined the patient. I performed a substantive portion of this encounter, including complete performance of at least one of the key components, in conjunction with the APP. I agree with the APP's note, impression and recommendations.   This 63 year old very pleasant male with history of chronic hep B on tenofovir , daily alcohol use, compensated cirrhosis admitted with coffee-ground emesis and melena concerning for acute upper GI bleed.  Compensated cirrhosis, no  ascites  Noted varices GE junction on CT, no active extravasation  Plan to proceed with EGD for further evaluation and endoscopic therapeutic intervention as needed Continue octreotide  gtt. PPI IV twice daily IV ceftriaxone   Monitor hemoglobin and transfuse if below 7, avoid over transfusion N.p.o. after midnight  The patient was provided an opportunity to ask questions and all were answered. The patient agreed with the plan and demonstrated an understanding of the instructions.  LOIS Wilkie Mcgee , MD [phone]

## 2023-03-28 NOTE — ED Provider Notes (Signed)
 [facility] Provider Note   CSN: [id number] Arrival date & time: 03/28/23  1038     History  Chief Complaint  Patient presents with   Hematemesis   Chest Pain   Blood In Stools    Gregory Singh is a 63 y.o. male.  Patient with history of alcohol use disorder, daily drinking, history of hepatitis B on tenofovir , history of hypercholesterolemia and hypertension --presents to the emergency department today for evaluation of GI bleeding.  Patient states that he awoke this morning.  Early in the morning he had a lot of nausea.  This morning around 7 or 8 AM he was using the bathroom and had vomiting.  He noted some black specks in the vomit.  Later in the morning and upon ED arrival he is having dark maroon stool.  Denies heavy NSAID use.  Denies history of peptic ulcers.  He has had screening colonoscopies.  Denies anticoagulation.  Of note, when his name was called in waiting room, he reports dizziness with standing.  Patient had a bloody bowel movement once he came back in the department.  He syncopized while on the toilet and was found on the ground.  He sustained a laceration to the right eyebrow.      Home Medications Prior to Admission medications   Medication Sig Start Date End Date Taking? Authorizing Provider  b complex vitamins capsule Take 1 capsule by mouth daily.    [provider]  clobetasol  ointment (TEMOVATE ) 0.05 % Apply bid to rash on leg liberally, do not use on face or genitals 12/27/21   Berneta Elsie Sayre, MD  gemfibrozil  (LOPID ) 600 MG tablet TAKE 1 TABLET(600 MG) BY MOUTH TWICE DAILY 08/21/22   Berneta Elsie Sayre, MD  hydrochlorothiazide  (HYDRODIURIL ) 25 MG tablet Take 1 tablet by mouth once daily 12/28/22   Berneta Elsie Sayre, MD  Multiple Vitamins-Minerals (MULTIVITAMIN WITH IRON-MINERALS) liquid Take by mouth daily.    [provider]  omega-3 acid ethyl esters (LOVAZA ) 1 g capsule Take 2  capsules (2 g total) by mouth daily. 01/15/17   Isadora Krabbe D, PA  potassium chloride  SA (KLOR-CON  M) 20 MEQ tablet TAKE 1 TABLET(20 MEQ) BY MOUTH DAILY 12/31/22   Berneta Elsie Sayre, MD  sildenafil  (VIAGRA ) 100 MG tablet Take 0.5-1 tablets (50-100 mg total) by mouth daily as needed for erectile dysfunction. 06/28/22   Berneta Elsie Sayre, MD  tenofovir  (VIREAD ) 300 MG tablet Take 1 tablet (300 mg total) by mouth daily. 10/02/22   Luiz Channel, MD      Allergies    Patient has no known allergies.    Review of Systems   Review of Systems  Physical Exam Updated Vital Signs BP 132/87 (BP Location: Right Arm)   Pulse (!) 127   Temp (!) 97.5 F (36.4 C)   Resp (!) 24   Ht 5' 6 (1.676 m)   Wt 59 kg   SpO2 100%   BMI 20.98 kg/m   Physical Exam Vitals and nursing note reviewed.  Constitutional:      General: He is not in acute distress.    Appearance: He is well-developed.  HENT:     Head: Normocephalic and atraumatic.  Eyes:     General:        Right eye: No discharge.        Left eye: No discharge.     Conjunctiva/sclera: Conjunctivae normal.  Cardiovascular:     Rate and Rhythm:  Regular rhythm. Tachycardia present.     Heart sounds: Normal heart sounds.  Pulmonary:     Effort: Pulmonary effort is normal.     Breath sounds: Normal breath sounds.  Abdominal:     Palpations: Abdomen is soft.     Tenderness: There is no abdominal tenderness.  Musculoskeletal:     Cervical back: Normal range of motion and neck supple.  Skin:    General: Skin is warm and dry.  Neurological:     Mental Status: He is alert.    ED Results / Procedures / Treatments   Labs (all labs ordered are listed, but only abnormal results are displayed) Labs Reviewed  COMPREHENSIVE METABOLIC PANEL - Abnormal; Notable for the following components:      Result Value   Glucose, Bld 266 (*)    BUN 32 (*)    Total Protein 6.2 (*)    Albumin 2.9 (*)    AST 51 (*)    All other  components within normal limits  CBC - Abnormal; Notable for the following components:   WBC 11.1 (*)    RBC 3.53 (*)    Hemoglobin 12.5 (*)    HCT 36.3 (*)    MCV 102.8 (*)    MCH 35.4 (*)    Platelets 113 (*)    All other components within normal limits  TROPONIN I (HIGH SENSITIVITY) - Abnormal; Notable for the following components:   Troponin I (High Sensitivity) 53 (*)    All other components within normal limits  PROTIME-INR  HEMOGLOBIN AND HEMATOCRIT, BLOOD  POC OCCULT BLOOD, ED  TYPE AND SCREEN  TROPONIN I (HIGH SENSITIVITY)    EKG EKG Interpretation Date/Time:  Thursday March 28 2023 10:44:55 EST Ventricular Rate:  127 PR Interval:  130 QRS Duration:  86 QT Interval:  314 QTC Calculation: 456 R Axis:   7  Text Interpretation: Sinus tachycardia Borderline ECG No previous ECGs available Confirmed by Kommor, Madison ([id number]) on 03/28/2023 11:00:02 AM  Radiology DG Chest 2 View Result Date: 03/28/2023 CLINICAL DATA:  Chest pain. EXAM: CHEST - 2 VIEW COMPARISON:  Chest radiograph dated 07/19/2020. FINDINGS: No focal consolidation, pleural effusion, or pneumothorax. The cardiac silhouette is within normal limits. Degenerative changes of the spine. No acute osseous pathology. IMPRESSION: No active cardiopulmonary disease. Electronically Signed   By: Vanetta Chou M.D.   On: 03/28/2023 11:20    Procedures .Laceration Repair  Date/Time: 03/28/2023 2:58 PM  Performed by: Desiderio Chew, PA-C Authorized by: Desiderio Chew, PA-C   Consent:    Consent obtained:  Verbal   Consent given by:  Patient   Risks discussed:  Pain and poor wound healing Universal protocol:    Patient identity confirmed:  Verbally with patient and provided demographic data Anesthesia:    Anesthesia method:  Local infiltration   Local anesthetic:  Lidocaine  2% WITH epi Laceration details:    Location:  Face   Face location:  R eyebrow   Length (cm):  1.5 Pre-procedure details:    Preparation:   Patient was prepped and draped in usual sterile fashion and imaging obtained to evaluate for foreign bodies Exploration:    Hemostasis achieved with:  Direct pressure   Imaging outcome: foreign body not noted     Wound exploration: wound explored through full range of motion     Contaminated: no   Treatment:    Area cleansed with:  Shur-Clens   Amount of cleaning:  Extensive Skin repair:    Repair method:  Sutures   Suture size:  6-0   Suture material:  Nylon   Suture technique:  Simple interrupted   Number of sutures:  3 Approximation:    Approximation:  Close Repair type:    Repair type:  Simple Post-procedure details:    Dressing:  Open (no dressing)   Procedure completion:  Tolerated well, no immediate complications     Medications Ordered in ED Medications  pantoprazole  (PROTONIX ) injection 40 mg (has no administration in time range)    Followed by  pantoprazole  (PROTONIX ) injection 40 mg (has no administration in time range)  sodium chloride  0.9 % bolus 1,000 mL (has no administration in time range)  lidocaine -EPINEPHrine  (XYLOCAINE  W/EPI) 2 %-1:200000 (PF) injection 10 mL (has no administration in time range)    ED Course/ Medical Decision Making/ A&P    12:40 PM I met patient when I was called to the bathroom.  Patient was brought back and was on the toilet.  He reportedly had a bloody bowel movement and syncopized.  He hit his head on the floor.  Security unlocked the restroom and patient was tended to.  On arrival he was awake and alert, but diaphoretic.  Currently working to clamp the patient to get him into bed.  Will clean facial wounds.  There was a lot of maroon blood on the floor, suspected from rectum.  Patient seen and examined. History obtained directly from patient.   Labs and EKG reviewed that were ordered in triage including: CBC with hemoglobin 12.5 which is less than the patient's baseline, macrocytic anemia noted; CMP with normal creatinine but  elevated BUN suggestive of upper GI bleed, low protein and albumin suggestive of chronic liver disease, glucose noted at 266 with normal electrolytes; troponin elevated at 53, patient without chest pain, will trend.  Imaging: Ordered CT head, CT angio of the abdomen and pelvis  Medications/Fluids: Ordered: IV Protonix  bolus, IV fluid bolus, lidocaine  with epinephrine   Most recent vital signs reviewed and are as follows: BP 98/63 (BP Location: Right Arm)   Pulse 94   Temp 98.3 F (36.8 C) (Oral)   Resp 19   Ht 5' 6 (1.676 m)   Wt 59 kg   SpO2 97%   BMI 20.98 kg/m   Initial impression: Patient with upper GI bleeding, concern for heavy bleeding although currently hemodynamically stable, will recheck H&H, Protonix  started.   2:59 PM Reassessment performed. Patient appears stable.  Wound repaired at bedside.  Labs personally reviewed and interpreted including: Repeat H&H showed hemoglobin 12.0.  Repeat troponin at 50 down from 53.  INR was normal.  Imaging personally visualized and interpreted including: CT head, agree negative.  CT angio of the abdomen without source of bleeding or arterial stenosis, liver appears cirrhotic and there are some varices noted.  Reviewed pertinent lab work and imaging with patient at bedside. Questions answered.   Most current vital signs reviewed and are as follows: BP 98/63 (BP Location: Right Arm)   Pulse 94   Temp 98.3 F (36.8 C) (Oral)   Resp 19   Ht 5' 6 (1.676 m)   Wt 59 kg   SpO2 97%   BMI 20.98 kg/m   Plan: Admit to hospital.   Plan to discuss with hospitalist and [facility].  3:11 PM Spoke with oncall APP for [place] [facility] -- plan to consult.   3:23 PM Spoke with Dr. Claudene of [facility].   CRITICAL CARE Performed by: Fonda Ruby PA-C Total critical care time: 66  minutes Critical care time was exclusive of separately billable procedures and treating other patients. Critical care was necessary to treat or prevent imminent or  life-threatening deterioration. Critical care was time spent personally by me on the following activities: development of treatment plan with patient and/or surrogate as well as nursing, discussions with consultants, evaluation of patient's response to treatment, examination of patient, obtaining history from patient or surrogate, ordering and performing treatments and interventions, ordering and review of laboratory studies, ordering and review of radiographic studies, pulse oximetry and re-evaluation of patient's condition.                                   Medical Decision Making Amount and/or Complexity of Data Reviewed Labs: ordered. Radiology: ordered.  Risk Prescription drug management.   The following differentials were considered for this patient's suspected upper GI bleed: Esophagogastric varices: Often a history of alcohol abuse, cirrhosis, ascites, prior UGIB Peptic ulcer disease: Often a history of nonsteroidal anti-inflammatory drug (NSAID) use, Helicobacter pylori infection Vascular anomalies: Arteriovenous malformation (AVM) by history, Angiodysplasia Mallory-Weiss tear: Longitudinal tear of the esophageal mucosa in setting of forceful vomiting Aortoenteric fistula: Often history of massive bleeding, history of aortic procedure Dieulafoy lesion (submucosal artery lesion) Malignancy  Patient will need admission given new suspected upper GI bleed with hemodynamic instability manifested by syncopal episode and dizziness with standing.  Currently does not require blood.  His heart rate was elevated on arrival, but improved now.  The patient's vital signs, pertinent lab work and imaging were reviewed and interpreted as discussed in the ED course. Hospitalization was considered for further testing, treatments, or serial exams/observation. However as patient is well-appearing, has a stable exam, and reassuring studies today, I do not feel that they warrant admission at this time.  This plan was discussed with the patient who verbalizes agreement and comfort with this plan and seems reliable and able to return to the Emergency Department with worsening or changing symptoms.          Final Clinical Impression(s) / ED Diagnoses Final diagnoses:  Upper GI bleed    Rx / DC Orders ED Discharge Orders     None         Desiderio Chew, PA-C 03/28/23 1523    Emil Share, DO 03/29/23 [phone]

## 2023-03-28 NOTE — ED Provider Triage Note (Signed)
 Emergency Medicine Provider Triage Evaluation Note  Gregory Singh , a 63 y.o. male  was evaluated in triage.  Pt complains of coffee-ground emesis, chest pain.  States that overnight starting around 2 AM he had a sensation of burning and discomfort in the lower chest/epigastrium that radiated up into the chest.  Had an episode of coffee-ground emesis this morning.  Also endorses 4 days of dark tarry stools.  Does drink beer daily.  Denies associated shortness of breath, abdominal pain, headache, fever.  No bright red blood per rectum or bright red hematemesis.  Does arrive pale appearing and tachycardic  Review of Systems  Positive: Coffee-ground emesis, dark stools Negative: Shortness of breath, abdominal pain, headache  Physical Exam  BP 132/87 (BP Location: Right Arm)   Pulse (!) 127   Temp (!) 97.5 F (36.4 C)   Resp (!) 24   Ht 5' 6 (1.676 m)   Wt 59 kg   SpO2 100%   BMI 20.98 kg/m  Gen:   Awake, no distress   Resp:  Normal effort  MSK:   Moves extremities without difficulty    Medical Decision Making  Medically screening exam initiated at 10:57 AM.  Appropriate orders placed.  Gregory Singh was informed that the remainder of the evaluation will be completed by another provider, this initial triage assessment does not replace that evaluation, and the importance of remaining in the ED until their evaluation is complete.     Albertina Dixon, MD 03/28/23 1059

## 2023-03-28 NOTE — ED Notes (Signed)
 Pt ambulated to bathroom, spent to long on bathroom, when he didn't answer door was opened and pt was found on the floor on a pool of bloody stool, and coffee ground emesis.  There was a stain of fresh bright blood coming from new laceration on right eyebrow.   Pt does not remember falling or the time lapsed on floor. Otherwise alert and oriented x4.

## 2023-03-28 NOTE — H&P (Signed)
 History and Physical    Patient: Gregory Singh FMW:[id number] DOB: 1961-03-13 DOA: 03/28/2023 DOS: the patient was seen and examined on 03/28/2023 PCP: Berneta Elsie Sayre, MD  Patient coming from: Transfer from [facility]  Chief Complaint:  Chief Complaint  Patient presents with   Hematemesis   Chest Pain   Blood In Stools   HPI: Gregory Singh is a 63 y.o. male with medical history significant of hypertension , Hep B, alcohol abusefollowing an episode of vomiting and black stools. The symptoms began around 2 AM with a sensation of hot air in the chest, akin to heartburn, which caused restlessness and inability to sleep. By 7:30 AM, the patient vomited, noting black particles in the vomitus. The patient also reported passing black stools. There was no associated abdominal pain.  He had contacted his primary care provider was contacted via phone, who advised immediate hospital admission.  While waiting in the hospital, the patient experienced an episode of syncope in the bathroom, describing it as a gradual onset of dizziness leading to a blackout where he fell hitting the right side of his head.  He is unsure of how long he was unconscious.  The patient denied any chest pain or fever, but reported feeling cold. The patient also denied any recent changes in medication changes or use of non-steroidal anti-inflammatory drugs. The patient has a history of regular alcohol consumption, averaging two to three 12-ounce cans nightly.  The patient has a history of undergoing colonoscopies every two to three years due to polyps, but has never had an upper endoscopy. There is no family history of colon cancer.  Labs significant for WBC 11.1, hemoglobin 12.5-> 12, platelets 113, BUN 32, creatinine 0.9, glucose 266, AST 51, ALT 44, INR 1.2, high-sensitivity troponin 53->50.  Patient was typed and screened for possible need of blood products.  Chest x-ray noted no acute  abnormality.  Patient had went to the bathroom and did not answer the door and was found down on the floor in a pool of bloody stool and coffee-ground emesis with fresh bright blood coming from and new right elbow parietal laceration.  CT scan of the head was obtained that did not note any acute intercranial abnormality and the laceration was repaired by the ED provider.  [facility] was consulted.  Patient had typed and screened for possible need of blood products.  Patient was given 1 L bolus of normal saline IV fluids, Protonix  80 mg IV, and started on CIWA protocols.  Review of Systems: As mentioned in the history of present illness. All other systems reviewed and are negative. Past Medical History:  Diagnosis Date   Hypertension    History reviewed. No pertinent surgical history. Social History:  reports that he has been smoking cigarettes. He started smoking about 43 years ago. He has a 10.8 pack-year smoking history. He has never used smokeless tobacco. He reports current alcohol use of about 24.0 standard drinks of alcohol per week. He reports that he does not use drugs.  No Known Allergies  Family History  Problem Relation Age of Onset   Healthy Mother    Diabetes Father    Stroke Father    Colon cancer Neg Hx    Colon polyps Neg Hx    Esophageal cancer Neg Hx    Rectal cancer Neg Hx    Stomach cancer Neg Hx     Prior to Admission medications   Medication Sig Start Date End Date  Taking? Authorizing Provider  b complex vitamins capsule Take 1 capsule by mouth daily.   Yes [provider]  clobetasol  ointment (TEMOVATE ) 0.05 % Apply bid to rash on leg liberally, do not use on face or genitals 12/27/21  Yes Berneta Elsie Sayre, MD  gemfibrozil  (LOPID ) 600 MG tablet TAKE 1 TABLET(600 MG) BY MOUTH TWICE DAILY 08/21/22  Yes Berneta Elsie Sayre, MD  hydrochlorothiazide  (HYDRODIURIL ) 25 MG tablet Take 1 tablet by mouth once daily 12/28/22  Yes Berneta Elsie Sayre, MD   Multiple Vitamins-Minerals (MULTIVITAMIN WITH IRON-MINERALS) liquid Take by mouth daily.   Yes [provider]  omega-3 acid ethyl esters (LOVAZA ) 1 g capsule Take 2 capsules (2 g total) by mouth daily. 01/15/17  Yes English, Corean D, PA  potassium chloride  SA (KLOR-CON  M) 20 MEQ tablet TAKE 1 TABLET(20 MEQ) BY MOUTH DAILY 12/31/22  Yes Berneta Elsie Sayre, MD  sildenafil  (VIAGRA ) 100 MG tablet Take 0.5-1 tablets (50-100 mg total) by mouth daily as needed for erectile dysfunction. 06/28/22  Yes Berneta Elsie Sayre, MD  tenofovir  (VIREAD ) 300 MG tablet Take 1 tablet (300 mg total) by mouth daily. 10/02/22  Yes Luiz Channel, MD    Physical Exam: Vitals:   03/28/23 1042 03/28/23 1047 03/28/23 1254  BP: 132/87  98/63  Pulse: (!) 127  94  Resp: (!) 24  19  Temp: (!) 97.5 F (36.4 C)  98.3 F (36.8 C)  TempSrc:   Oral  SpO2: 100%  97%  Weight:  59 kg   Height:  5' 6 (1.676 m)     Constitutional: Older adult male currently in no acute distress Eyes: PERRL, lids and conjunctivae normal.  Laceration repaired with stitches to the right brow ENMT: Mucous membranes are moist.  Fair dentition Neck: normal, supple  Respiratory: clear to auscultation bilaterally, no wheezing, no crackles. Normal respiratory effort. No accessory muscle use.  Cardiovascular: Regular rate and rhythm, no murmurs / rubs / gallops. No extremity edema. 2+ pedal pulses.   Abdomen: no tenderness, no masses palpated. No hepatosplenomegaly. Bowel sounds positive.  Musculoskeletal: no clubbing / cyanosis. No joint deformity upper and lower extremities. Good ROM, no contractures. Normal muscle tone.  Skin: Bruising surrounding right eye. Neurologic: CN 2-12 grossly intact.   Strength 5/5 in all 4.  Psychiatric: Normal judgment and insight. Alert and oriented x 3. Normal mood.   Data Reviewed:  EKG revealed sinus tachycardia 127 bpm.  Reviewed labs, imaging, and pertinent records as  documented.  Assessment and Plan:  Hematemesis secondary to upper GI bleed Acute blood loss anemia Patient presented with complaints of episodes of vomiting blood and black stools.  Hemoglobin initially noted to be 12.5 with repeat check 12.  INR was noted to be 1.2.  Patient was typed and screened needed blood products.  [facility] was consulted.  Patient had been given 80 mg Protonix  IV x 1 dose. -Admit to a progressive bed -Aspiration precautions with elevation head of bed -Clear liquid diet and n.p.o. after midnight for EGD in a.m. -Serial monitoring of H&H.  Transfuse blood products as needed for symptomatic anemia or hemoglobin less than 7 g/dL -Continue Protonix  40 mg IV twice daily -Octreotide  drip due to concern for esophageal varices -Empiric antibiotics of Rocephin  IV -[facility] consulted,  will follow-up for further recommendations  Syncopal episode Patient noted to have a syncopal episode after using the bathroom and standing up.  It was reported that he was found in the low bloody stool and  emesis.  Suspect orthostatic hypotension as the likely cause of symptoms.  Blood pressures have been soft while in ED.  He had been given 1 L bolus of IV fluids. -Up with assistance -Follow-up telemetry -Hold home blood pressure regimen  Elevated troponin Acute.  Patient denied any reports of chest pain.  Initial high-sensitivity troponins 53->50. EKG without significant ischemic changes.  Suspect possibly secondary to demand.  Alcohol abuse Patient reports drinking 2-3 12 oz beers each night to sleep at night. -Ethanol level -CIWA protocols initiated with IV as needed -MVI, folic acid , thiamine   Transaminitis Chronic.  AST mildly elevated at 51 with ALT of 44.  Thought secondary to patient's history of alcohol abuse. -Continue to monitor  Hyperglycemia Acute.  Initial glucose elevated at 266.  Patient without prior history of diabetes.  Suspect secondary to acute stress  response. -Check hemoglobin A1c  Cirrhosis Chronic Hepatitis B Patient has a history of chronic hepatitis B on treatment with tenofovir .  Ultrasound of the right upper quadrant back in 10/17/2022 with lobulated border of the liver with heterogeneous increase in echotexture consistent with cirrhosis. -Continue tenofovir   Thrombocytopenia Chronic.  Platelet count 113 which appears similar to prior  Tobacco abuse -Nicotine  patch offered -Continue to counsel need of cessation of tobacco use  DVT prophylaxis: SCDs  Advance Care Planning:   Code Status: Full Code    Consults: [facility]  Family Communication: Wife updated at bedside  Severity of Illness: The appropriate patient status for this patient is INPATIENT. Inpatient status is judged to be reasonable and necessary in order to provide the required intensity of service to ensure the patient's safety. The patient's presenting symptoms, physical exam findings, and initial radiographic and laboratory data in the context of their chronic comorbidities is felt to place them at high risk for further clinical deterioration. Furthermore, it is not anticipated that the patient will be medically stable for discharge from the hospital within 2 midnights of admission.   * I certify that at the point of admission it is my clinical judgment that the patient will require inpatient hospital care spanning beyond 2 midnights from the point of admission due to high intensity of service, high risk for further deterioration and high frequency of surveillance required.*  Author: Maximino DELENA Sharps, MD 03/28/2023 3:17 PM  For on call review [email or web address].

## 2023-03-29 ENCOUNTER — Inpatient Hospital Stay (HOSPITAL_BASED_OUTPATIENT_CLINIC_OR_DEPARTMENT_OTHER): Payer: Commercial Managed Care - HMO

## 2023-03-29 ENCOUNTER — Encounter (HOSPITAL_COMMUNITY): Payer: Self-pay | Admitting: Internal Medicine

## 2023-03-29 ENCOUNTER — Inpatient Hospital Stay (HOSPITAL_COMMUNITY): Payer: Commercial Managed Care - HMO

## 2023-03-29 ENCOUNTER — Encounter (HOSPITAL_COMMUNITY)
Admission: EM | Disposition: A | Discharge status: Home / Self Care | Payer: Self-pay | Source: Home / Self Care | Attending: Emergency Medicine

## 2023-03-29 DIAGNOSIS — K209 Esophagitis, unspecified without bleeding: Secondary | ICD-10-CM | POA: Diagnosis not present

## 2023-03-29 DIAGNOSIS — F17200 Nicotine dependence, unspecified, uncomplicated: Secondary | ICD-10-CM | POA: Diagnosis not present

## 2023-03-29 DIAGNOSIS — K922 Gastrointestinal hemorrhage, unspecified: Secondary | ICD-10-CM | POA: Diagnosis not present

## 2023-03-29 DIAGNOSIS — J449 Chronic obstructive pulmonary disease, unspecified: Secondary | ICD-10-CM | POA: Diagnosis not present

## 2023-03-29 DIAGNOSIS — K259 Gastric ulcer, unspecified as acute or chronic, without hemorrhage or perforation: Secondary | ICD-10-CM

## 2023-03-29 HISTORY — PX: BIOPSY: SHX5522

## 2023-03-29 HISTORY — PX: ESOPHAGOGASTRODUODENOSCOPY (EGD) WITH PROPOFOL: SHX5813

## 2023-03-29 LAB — CBC
HCT: 28.1 % — ABNORMAL LOW (ref 39.0–52.0)
Hemoglobin: 9.6 g/dL — ABNORMAL LOW (ref 13.0–17.0)
MCH: 35.3 pg — ABNORMAL HIGH (ref 26.0–34.0)
MCHC: 34.2 g/dL (ref 30.0–36.0)
MCV: 103.3 fL — ABNORMAL HIGH (ref 80.0–100.0)
Platelets: 85 10*3/uL — ABNORMAL LOW (ref 150–400)
RBC: 2.72 MIL/uL — ABNORMAL LOW (ref 4.22–5.81)
RDW: 13.4 % (ref 11.5–15.5)
WBC: 9.8 10*3/uL (ref 4.0–10.5)
nRBC: 0 % (ref 0.0–0.2)

## 2023-03-29 LAB — ABO/RH: ABO/RH(D): O POS

## 2023-03-29 LAB — COMPREHENSIVE METABOLIC PANEL
ALT: 33 U/L (ref 0–44)
AST: 43 U/L — ABNORMAL HIGH (ref 15–41)
Albumin: 2.4 g/dL — ABNORMAL LOW (ref 3.5–5.0)
Alkaline Phosphatase: 73 U/L (ref 38–126)
Anion gap: 10 (ref 5–15)
BUN: 21 mg/dL — ABNORMAL HIGH (ref 6–20)
CO2: 24 mmol/L (ref 22–32)
Calcium: 8.4 mg/dL — ABNORMAL LOW (ref 8.9–10.3)
Chloride: 107 mmol/L (ref 98–111)
Creatinine, Ser: 0.88 mg/dL (ref 0.61–1.24)
GFR, Estimated: >60 mL/min (ref >60)
Glucose, Bld: 128 mg/dL — ABNORMAL HIGH (ref 70–99)
Potassium: 3.6 mmol/L (ref 3.5–5.1)
Sodium: 141 mmol/L (ref 135–145)
Total Bilirubin: 1 mg/dL (ref 0.0–1.2)
Total Protein: 5.1 g/dL — ABNORMAL LOW (ref 6.5–8.1)

## 2023-03-29 LAB — HEMOGLOBIN AND HEMATOCRIT, BLOOD
HCT: 25.8 % — ABNORMAL LOW (ref 39.0–52.0)
Hemoglobin: 9 g/dL — ABNORMAL LOW (ref 13.0–17.0)

## 2023-03-29 SURGERY — ESOPHAGOGASTRODUODENOSCOPY (EGD) WITH PROPOFOL
Anesthesia: Monitor Anesthesia Care

## 2023-03-29 MED ORDER — PROPOFOL 10 MG/ML IV BOLUS
INTRAVENOUS | Status: DC | PRN
  Administered 2023-03-29: 70 mg via INTRAVENOUS
  Administered 2023-03-29: 30 mg via INTRAVENOUS
  Administered 2023-03-29: 10 mg via INTRAVENOUS
  Administered 2023-03-29 (×2): 20 mg via INTRAVENOUS
  Administered 2023-03-29: 30 mg via INTRAVENOUS

## 2023-03-29 MED ORDER — LIDOCAINE 2% (20 MG/ML) 5 ML SYRINGE
INTRAMUSCULAR | Status: DC | PRN
  Administered 2023-03-29: 100 mg via INTRAVENOUS

## 2023-03-29 MED ORDER — PANTOPRAZOLE SODIUM 40 MG PO TBEC
40.0000 mg | DELAYED_RELEASE_TABLET | Freq: Two times a day (BID) | ORAL | Status: DC
  Administered 2023-03-29 – 2023-03-30 (×2): 40 mg via ORAL
  Filled 2023-03-29 (×2): qty 1

## 2023-03-29 MED ORDER — SODIUM CHLORIDE 0.9 % IV SOLN
INTRAVENOUS | Status: AC | PRN
  Administered 2023-03-29: 250 mL via INTRAVENOUS

## 2023-03-29 MED ORDER — PHENYLEPHRINE HCL (PRESSORS) 10 MG/ML IV SOLN
INTRAVENOUS | Status: DC | PRN
  Administered 2023-03-29: 160 ug via INTRAVENOUS
  Administered 2023-03-29 (×2): 80 ug via INTRAVENOUS

## 2023-03-29 MED ORDER — RINGERS IV SOLN: INTRAVENOUS | Status: DC

## 2023-03-29 MED ORDER — RINGERS IV SOLN: INTRAVENOUS | Status: AC

## 2023-03-29 SURGICAL SUPPLY — 14 items
BLOCK BITE 60FR ADLT L/F BLUE (MISCELLANEOUS) ×3 IMPLANT
ELECT REM PT RETURN 9FT ADLT (ELECTROSURGICAL)
ELECTRODE REM PT RTRN 9FT ADLT (ELECTROSURGICAL) IMPLANT
FORCEP RJ3 GP 1.8X160 W-NEEDLE (CUTTING FORCEPS) IMPLANT
FORCEPS BIOP RAD 4 LRG CAP 4 (CUTTING FORCEPS) IMPLANT
NDL SCLEROTHERAPY 25GX240 (NEEDLE) IMPLANT
NEEDLE SCLEROTHERAPY 25GX240 (NEEDLE) IMPLANT
PROBE APC STR FIRE (PROBE) IMPLANT
PROBE INJECTION GOLD 7FR (MISCELLANEOUS) IMPLANT
SNARE SHORT THROW 13M SML OVAL (MISCELLANEOUS) IMPLANT
SYR 50ML LL SCALE MARK (SYRINGE) IMPLANT
TUBING ENDO SMARTCAP PENTAX (MISCELLANEOUS) ×6 IMPLANT
TUBING IRRIGATION ENDOGATOR (MISCELLANEOUS) ×3 IMPLANT
WATER STERILE IRR 1000ML POUR (IV SOLUTION) IMPLANT

## 2023-03-29 NOTE — Discharge Instructions (Signed)

## 2023-03-29 NOTE — Anesthesia Preprocedure Evaluation (Addendum)
 Anesthesia Evaluation  Patient identified by MRN, date of birth, ID band Patient awake    Reviewed: Allergy & Precautions, NPO status , Patient's Chart, lab work & pertinent test results  Airway Mallampati: III  TM Distance: >3 FB Neck ROM: Full    Dental no notable dental hx. (+) Teeth Intact, Dental Advisory Given   Pulmonary COPD, Current Smoker and Patient abstained from smoking.   Pulmonary exam normal breath sounds clear to auscultation       Cardiovascular hypertension, Pt. on medications Normal cardiovascular exam Rhythm:Regular Rate:Normal     Neuro/Psych negative neurological ROS  negative psych ROS   GI/Hepatic negative GI ROS,,,(+) Cirrhosis     substance abuse  alcohol use, Hepatitis -, B  Endo/Other  negative endocrine ROS    Renal/GU negative Renal ROS  negative genitourinary   Musculoskeletal negative musculoskeletal ROS (+)    Abdominal   Peds  Hematology  (+) Blood dyscrasia, anemia Lab Results      Component                Value               Date                      WBC                      9.8                 03/29/2023                HGB                      9.6 (L)             03/29/2023                HCT                      28.1 (L)            03/29/2023                MCV                      103.3 (H)           03/29/2023                PLT                      85 (L)              03/29/2023              Anesthesia Other Findings   Reproductive/Obstetrics                             Anesthesia Physical Anesthesia Plan  ASA: 3  Anesthesia Plan: MAC   Post-op Pain Management:    Induction: Intravenous  PONV Risk Score and Plan: Propofol  infusion and Treatment may vary due to age or medical condition  Airway Management Planned: Natural Airway  Additional Equipment:   Intra-op Plan:   Post-operative Plan:   Informed Consent: I have reviewed the  patients History and Physical, chart, labs and discussed the procedure including the risks, benefits and alternatives for the proposed  anesthesia with the patient or authorized representative who has indicated his/her understanding and acceptance.     Dental advisory given  Plan Discussed with: CRNA  Anesthesia Plan Comments:        Anesthesia Quick Evaluation

## 2023-03-29 NOTE — Progress Notes (Signed)
 PROGRESS NOTE    Gregory Singh  FMW:[id number] DOB: 1960/11/25 DOA: 03/28/2023 PCP: Berneta Elsie Sayre, MD   Brief Narrative: 63 year old with past medical history significant for hypertension, hepatitis B, alcohol abuse, presents with nausea vomiting and black stool.  Symptoms started around 2 AM the morning of admission.  While waiting in the hospital the patient experienced an episode of syncope in the bathroom.  Patient had a right elbow laceration that was sutured in the ED.   Assessment & Plan:   Principal Problem:   Upper GI bleed Active Problems:   Hematemesis   Acute blood loss anemia   Syncope, vasovagal   Elevated troponin   Alcohol abuse   Hepatitis B   Cirrhosis (HCC)   Thrombocytopenia (HCC)   Cigarette smoker  1-Hematemesis, melena acute blood loss anemia: -Patient presenting with coffee-ground emesis, abdominal pain. -Hemoglobin has decreased to 9 from 12 on admission -Underwent endoscopy which showed gastric ulcer clean base, gastritis, esophagitis -Plan to monitor overnight hemoglobin resume diet continue with IV Protonix   Syncopal: In the setting of hypovolemia, anemia Will check orthostatic vitals, continue with IV fluids Continue to hold BP medications.   Elevation of troponin: In the setting of acute blood loss,   Alcohol abuse: -Monitor  CIWA  Transaminases: In the setting of alcohol use.  Monitor  Hyperglycemia: Follow A1c  Cirrhosis, chronic hepatitis B: On chronic hepatitis B with tenofovir .   Thrombocytopenia Continue to monitor  Tobacco abuse Counseling.         Estimated body mass index is 20.98 kg/m as calculated from the following:   Height as of this encounter: 5' 6 (1.676 m).   Weight as of this encounter: 59 kg.   DVT prophylaxis: SCD Code Status: Full code Family Communication: Wife at bedside Disposition Plan:  Status is: Inpatient Remains inpatient appropriate because: management of anemia    Consultants:   GI  Procedures:  none  Antimicrobials:    Subjective: He is alert, denies dizziness. No further vomiting.   Objective: Vitals:   03/29/23 0300 03/29/23 0400 03/29/23 0500 03/29/23 0610  BP: 90/71 107/72 101/67 (!) 108/92  Pulse: 76 79 83 79  Resp: 15 15 16 12   Temp:    98.1 F (36.7 C)  TempSrc:    Oral  SpO2: 99% 98% 98% 99%  Weight:      Height:        Intake/Output Summary (Last 24 hours) at 03/29/2023 0816 Last data filed at 03/28/2023 1837 Gross per 24 hour  Intake 100 ml  Output --  Net 100 ml   Filed Weights   03/28/23 1047  Weight: 59 kg    Examination:  General exam: Appears calm and comfortable  Respiratory system: Clear to auscultation. Respiratory effort normal. Cardiovascular system: S1 & S2 heard, RRR. No JVD, murmurs, rubs, gallops or clicks. No pedal edema. Gastrointestinal system: Abdomen is nondistended, soft and nontender. No organomegaly or masses felt. Normal bowel sounds heard. Central nervous system: Alert and oriented. No focal neurological deficits. Extremities: Symmetric 5 x 5 power. Skin: No rashes, lesions or ulcers Psychiatry: Judgement and insight appear normal. Mood & affect appropriate.     Data Reviewed: I have personally reviewed following labs and imaging studies  CBC: Recent Labs  Lab 03/28/23 1053 03/28/23 1300 03/28/23 2110 03/29/23 0449  WBC 11.1*  --   --  9.8  HGB 12.5* 12.0* 9.5* 9.6*  HCT 36.3* 34.1* 27.3* 28.1*  MCV 102.8*  --   --  103.3*  PLT 113*  --   --  85*   Basic Metabolic Panel: Recent Labs  Lab 03/28/23 1053 03/29/23 0449  NA 135 141  K 3.9 3.6  CL 98 107  CO2 26 24  GLUCOSE 266* 128*  BUN 32* 21*  CREATININE 0.90 0.88  CALCIUM  9.0 8.4*   GFR: Estimated Creatinine Clearance: 74.5 mL/min (by C-G formula based on SCr of 0.88 mg/dL). Liver Function Tests: Recent Labs  Lab 03/28/23 1053 03/29/23 0449  AST 51* 43*  ALT 44 33  ALKPHOS 122 73  BILITOT 0.8 1.0  PROT 6.2* 5.1*   ALBUMIN 2.9* 2.4*   No results for input(s): LIPASE, AMYLASE in the last 168 hours. No results for input(s): AMMONIA in the last 168 hours. Coagulation Profile: Recent Labs  Lab 03/28/23 1300  INR 1.2   Cardiac Enzymes: No results for input(s): CKTOTAL, CKMB, CKMBINDEX, TROPONINI in the last 168 hours. BNP (last 3 results) No results for input(s): PROBNP in the last 8760 hours. HbA1C: Recent Labs    03/28/23 1053  HGBA1C 5.4   CBG: No results for input(s): GLUCAP in the last 168 hours. Lipid Profile: No results for input(s): CHOL, HDL, LDLCALC, TRIG, CHOLHDL, LDLDIRECT in the last 72 hours. Thyroid Function Tests: No results for input(s): TSH, T4TOTAL, FREET4, T3FREE, THYROIDAB in the last 72 hours. Anemia Panel: No results for input(s): VITAMINB12, FOLATE, FERRITIN, TIBC, IRON, RETICCTPCT in the last 72 hours. Sepsis Labs: No results for input(s): PROCALCITON, LATICACIDVEN in the last 168 hours.  No results found for this or any previous visit (from the past 240 hours).       Radiology Studies: CT Head Wo Contrast Result Date: 03/28/2023 CLINICAL DATA:  Head trauma. Moderate-to-severe. Coffee-ground emesis. Chest pain. EXAM: CT HEAD WITHOUT CONTRAST TECHNIQUE: Contiguous axial images were obtained from the base of the skull through the vertex without intravenous contrast. RADIATION DOSE REDUCTION: This exam was performed according to the departmental dose-optimization program which includes automated exposure control, adjustment of the mA and/or kV according to patient size and/or use of iterative reconstruction technique. COMPARISON:  None Available. FINDINGS: Brain: No evidence of acute infarction, hemorrhage, hydrocephalus, extra-axial collection or mass lesion/mass effect. There is bilateral periventricular hypodensity, which is non-specific but most likely seen in the settings of microvascular ischemic changes.  Minimal in extent. Otherwise normal appearance of brain parenchyma. Ventricles are normal. Cerebral volume is age appropriate. Vascular: No hyperdense vessel or unexpected calcification. Intracranial arteriosclerosis. Skull: Normal. Negative for fracture or focal lesion. Sinuses/Orbits: No acute finding. Other: Visualized mastoid air cells are unremarkable. No mastoid effusion. IMPRESSION: *No acute intracranial abnormality. Electronically Signed   By: Ree Molt M.D.   On: 03/28/2023 14:00   CT ANGIO GI BLEED Result Date: 03/28/2023 CLINICAL DATA:  GI bleed.  Coffee-ground emesis.  Chest pain. EXAM: CTA ABDOMEN AND PELVIS WITHOUT AND WITH CONTRAST TECHNIQUE: Multidetector CT imaging of the abdomen and pelvis was performed using the standard protocol during bolus administration of intravenous contrast. Multiplanar reconstructed images and MIPs were obtained and reviewed to evaluate the vascular anatomy. RADIATION DOSE REDUCTION: This exam was performed according to the departmental dose-optimization program which includes automated exposure control, adjustment of the mA and/or kV according to patient size and/or use of iterative reconstruction technique. CONTRAST:  60mL OMNIPAQUE  IOHEXOL  350 MG/ML SOLN COMPARISON:  None Available. FINDINGS: VASCULAR Aorta: Normal caliber aorta without aneurysm, dissection, vasculitis or significant stenosis. Celiac: Patent without evidence of aneurysm, dissection or vasculitis. Note is made  of moderate narrowing at the origin due to median arcuate ligament. SMA: Patent without evidence of aneurysm, dissection, vasculitis or significant stenosis. Renals: Both renal arteries are patent without evidence of aneurysm, dissection, vasculitis, fibromuscular dysplasia or significant stenosis. IMA: Patent without evidence of aneurysm, dissection, vasculitis or significant stenosis. Inflow: Patent without evidence of aneurysm, dissection, vasculitis or significant stenosis. Proximal  Outflow: Bilateral common femoral and visualized portions of the superficial and profunda femoral arteries are patent without evidence of aneurysm, dissection, vasculitis or significant stenosis. Veins: No obvious venous abnormality within the limitations of this arterial phase study. Review of the MIP images confirms the above findings. NON-VASCULAR Lower chest: There are dependent atelectatic changes in the visualized lung bases. No overt consolidation. No pleural effusion. The heart is normal in size. No pericardial effusion. Hepatobiliary: The liver is normal in size. There is mild liver surface irregularity/nodularity,, favoring cirrhosis. No suspicious liver lesions seen.No intrahepatic or extrahepatic bile duct dilation. No calcified gallstones. Normal gallbladder wall thickness. No pericholecystic inflammatory changes. Pancreas: Unremarkable. No pancreatic ductal dilatation or surrounding inflammatory changes. Spleen: Within normal limits. No focal lesion. Adrenals/Urinary Tract: Adrenal glands are unremarkable. No suspicious renal mass. There are several subcentimeter sized simple cysts in the right kidney lower pole. No hydronephrosis. No renal or ureteric calculi. Urinary bladder is under distended, precluding optimal assessment. However, there is mild diffuse circumferential thickening of the urinary bladder without perivesical fat stranding, likely sequela of chronic cystitis. Correlate clinically and with urinalysis. No large mass or stones identified. Stomach/Bowel: There is mild fullness in the gastroesophageal junction region and few varices in the lower esophagus. No disproportionate dilation of the small or large bowel loops. No evidence of abnormal bowel wall thickening or inflammatory changes. The appendix is unremarkable. There are scattered diverticula predominantly in the right hemicolon, without imaging signs of diverticulitis. No acute intravasation of contrast. Vascular/Lymphatic: No  ascites or pneumoperitoneum. No abdominal or pelvic lymphadenopathy, by size criteria. Reproductive: Normal size prostate. Symmetric seminal vesicles. Other: The visualized soft tissues and abdominal wall are unremarkable. Musculoskeletal: No suspicious osseous lesions. There are mild multilevel degenerative changes in the visualized spine. 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 IMPRESSION: 1. No CT evidence of active extravasation of contrast/GI bleeding. 2. No acute inflammatory process identified throughout the abdomen or pelvis. No bowel obstruction. 3. Cirrhotic liver configuration. Mild fullness and few varices in the lower thoracic esophagus/GE junction region. Consider better evaluation with upper GI endoscopy. No splenomegaly or ascites. 4. Multiple other nonacute observations, as described above. Electronically Signed   By: Ree Molt M.D.   On: 03/28/2023 13:57   DG Chest 2 View Result Date: 03/28/2023 CLINICAL DATA:  Chest pain. EXAM: CHEST - 2 VIEW COMPARISON:  Chest radiograph dated 07/19/2020. FINDINGS: No focal consolidation, pleural effusion, or pneumothorax. The cardiac silhouette is within normal limits. Degenerative changes of the spine. No acute osseous pathology. IMPRESSION: No active cardiopulmonary disease. Electronically Signed   By: Vanetta Chou M.D.   On: 03/28/2023 11:20        Scheduled Meds:  folic acid   1 mg Oral Daily   gemfibrozil   600 mg Oral BID AC   lidocaine -EPINEPHrine   10 mL Infiltration Once   multivitamin with minerals  1 tablet Oral Daily   nicotine   21 mg Transdermal Daily   pantoprazole  (PROTONIX ) IV  40 mg Intravenous Q12H   sodium chloride  flush  3 mL Intravenous Q12H   tenofovir   300 mg Oral Daily  thiamine   100 mg Oral Daily   Or   thiamine   100 mg Intravenous Daily   Continuous Infusions:  cefTRIAXone  (ROCEPHIN )  IV Stopped (03/28/23 1837)   octreotide  (SANDOSTATIN ) 500 mcg in sodium chloride  0.9 % 250 mL (2 mcg/mL)  infusion 50 mcg/hr (03/29/23 0259)     LOS: 1 day    Time spent: 35 minutes    Kavi Almquist A Stanly Si, MD [facility]   If 7PM-7AM, please contact night-coverage [email or web address]  03/29/2023, 8:16 AM

## 2023-03-29 NOTE — Interval H&P Note (Signed)
 History and Physical Interval Note:  03/29/2023 8:34 AM  Gregory Singh  has presented today for surgery, with the diagnosis of GI bleeding, melena, coffee ground emesis.  The various methods of treatment have been discussed with the patient and family. After consideration of risks, benefits and other options for treatment, the patient has consented to  Procedure(s): ESOPHAGOGASTRODUODENOSCOPY (EGD) WITH PROPOFOL  (N/A) as a surgical intervention.  The patient's history has been reviewed, patient examined, no change in status, stable for surgery.  I have reviewed the patient's chart and labs.  Questions were answered to the patient's satisfaction.     [name]

## 2023-03-29 NOTE — Progress Notes (Signed)
 Mobility Specialist Progress Note:    03/29/23 1400  Mobility  Activity Ambulated with assistance in hallway  Level of Assistance Standby assist, set-up cues, supervision of patient - no hands on  Assistive Device Other (Comment) (IV Pole)  Distance Ambulated (ft) 300 ft  Activity Response Tolerated well  Mobility Referral Yes  Mobility visit 1 Mobility  Mobility Specialist Start Time (ACUTE ONLY) 1414  Mobility Specialist Stop Time (ACUTE ONLY) 1420  Mobility Specialist Time Calculation (min) (ACUTE ONLY) 6 min   Pt received in bed and agreeable. Required no hands on assistance. No complaints throughout. Pt left in bed with call bell and all needs met. Bed alarm on.  D'Vante Nicholaus Mobility Specialist Please contact via Special Educational Needs Teacher or Rehab office at [phone]

## 2023-03-29 NOTE — Op Note (Signed)
 [facility] Patient Name: Gregory Singh Procedure Date : 03/29/2023 MRN: [id number] Attending MD: Gustav ALONSO Mcgee , MD, [id number] Date of Birth: 1961/02/23 CSN: [id number] Age: 63 Admit Type: Inpatient Procedure:                Upper GI endoscopy Indications:              Recent gastrointestinal bleeding, Suspected upper                            gastrointestinal bleeding Providers:                Gustav ALONSO Mcgee, MD, Darleene Bare, RN, Lorrayne Kitty, Technician Referring MD:              Medicines:                Monitored Anesthesia Care Complications:            No immediate complications. Estimated Blood Loss:     Estimated blood loss was minimal. Procedure:                Pre-Anesthesia Assessment:                           - Prior to the procedure, a History and Physical                            was performed, and patient medications and                            allergies were reviewed. The patient's tolerance of                            previous anesthesia was also reviewed. The risks                            and benefits of the procedure and the sedation                            options and risks were discussed with the patient.                            All questions were answered, and informed consent                            was obtained. Prior Anticoagulants: The patient has                            taken no anticoagulant or antiplatelet agents. ASA                            Grade Assessment: III - A patient with severe  systemic disease. After reviewing the risks and                            benefits, the patient was deemed in satisfactory                            condition to undergo the procedure.                           After obtaining informed consent, the endoscope was                            passed under direct vision. Throughout the                            procedure, the  patient's blood pressure, pulse, and                            oxygen saturations were monitored continuously. The                            [id number]-H190 ([id number]) Olympus endoscope was introduced                            through the mouth, and advanced to the second part                            of duodenum. The upper GI endoscopy was                            accomplished without difficulty. The patient                            tolerated the procedure well. Scope In: Scope Out: Findings:      The Z-line was regular and was found 40 cm from the incisors.      LA Grade B (one or more mucosal breaks greater than 5 mm, not extending       between the tops of two mucosal folds) esophagitis with no bleeding was       found 35 to 40 cm from the incisors.      The exam of the esophagus was otherwise normal. No esophageal varices      Few non-bleeding superficial gastric ulcers with no stigmata of bleeding       were found in the gastric antrum and in the prepyloric region of the       stomach. The largest lesion was less than one mm in largest dimension.       Biopsies were taken with a cold forceps for histology.      Patchy mild inflammation characterized by congestion (edema), erosions,       erythema and friability was found in the entire examined stomach.       Biopsies were taken with a cold forceps for Helicobacter pylori testing.      The cardia and gastric fundus were normal on retroflexion.      The examined duodenum was normal. Impression:               -  Z-line regular, 40 cm from the incisors.                           - LA Grade B reflux esophagitis with no bleeding.                           - Non-bleeding gastric ulcers with no stigmata of                            bleeding. Biopsied.                           - Gastritis. Biopsied.                           - Normal examined duodenum. Recommendation:           - Patient has a contact number available for                             emergencies. The signs and symptoms of potential                            delayed complications were discussed with the                            patient. Return to normal activities tomorrow.                            Written discharge instructions were provided to the                            patient.                           - Resume previous diet.                           - Continue present medications.                           - DC Octreotide  and ceftriaxone                            - Use Protonix  (pantoprazole ) 40 mg PO BID.                           - No aspirin, ibuprofen, naproxen, or other                            non-steroidal anti-inflammatory drugs.                           - Await pathology results.                           - GI Will sign off, availble if have any questions  Procedure Code(s):        --- Professional ---                           [phone], Esophagogastroduodenoscopy, flexible,                            transoral; with biopsy, single or multiple Diagnosis Code(s):        --- Professional ---                           K21.00, Gastro-esophageal reflux disease with                            esophagitis, without bleeding                           K25.9, Gastric ulcer, unspecified as acute or                            chronic, without hemorrhage or perforation                           K29.70, Gastritis, unspecified, without bleeding                           K92.2, Gastrointestinal hemorrhage, unspecified CPT copyright 2022 American Medical Association. All rights reserved. The codes documented in this report are preliminary and upon coder review may  be revised to meet current compliance requirements. Tenelle Andreason V. Batoul Limes, MD 03/29/2023 10:14:00 AM This report has been signed electronically. Number of Addenda: 0

## 2023-03-29 NOTE — Progress Notes (Signed)
 CSW added substance abuse resources to patient's AVS.  Edwin Dada, MSW, LCSW Transitions of Care  Clinical Social Worker II [phone]

## 2023-03-29 NOTE — Anesthesia Postprocedure Evaluation (Signed)
 Anesthesia Post Note  Patient: Math Brazie  Procedure(s) Performed: ESOPHAGOGASTRODUODENOSCOPY (EGD) WITH PROPOFOL  BIOPSY     Patient location during evaluation: Endoscopy Anesthesia Type: MAC Level of consciousness: awake and alert Pain management: pain level controlled Vital Signs Assessment: post-procedure vital signs reviewed and stable Respiratory status: spontaneous breathing, nonlabored ventilation, respiratory function stable and patient connected to nasal cannula oxygen Cardiovascular status: blood pressure returned to baseline and stable Postop Assessment: no apparent nausea or vomiting Anesthetic complications: no  No notable events documented.  Last Vitals:  Vitals:   03/29/23 1045 03/29/23 1100  BP: 115/79 108/76  Pulse: 72 71  Resp: 17 18  Temp: 36.6 C   SpO2: 100% 98%    Last Pain:  Vitals:   03/29/23 1045  TempSrc: Oral  PainSc:                  Corley Maffeo L Aylanie Cubillos

## 2023-03-29 NOTE — Transfer of Care (Signed)
 Immediate Anesthesia Transfer of Care Note  Patient: Gregory Singh  Procedure(s) Performed: ESOPHAGOGASTRODUODENOSCOPY (EGD) WITH PROPOFOL  BIOPSY  Patient Location: PACU and Endoscopy Unit  Anesthesia Type:MAC  Level of Consciousness: drowsy  Airway & Oxygen Therapy: Patient Spontanous Breathing and Patient connected to nasal cannula oxygen  Post-op Assessment: Report given to RN and Post -op Vital signs reviewed and stable  Post vital signs: Reviewed and stable  Last Vitals:  Vitals Value Taken Time  BP 100/67 03/29/23 0950  Temp    Pulse 62 03/29/23 0952  Resp 12 03/29/23 0952  SpO2 100 % 03/29/23 0952  Vitals shown include unfiled device data.  Last Pain:  Vitals:   03/29/23 0947  TempSrc: Temporal  PainSc:          Complications: No notable events documented.

## 2023-03-30 DIAGNOSIS — K922 Gastrointestinal hemorrhage, unspecified: Secondary | ICD-10-CM | POA: Diagnosis not present

## 2023-03-30 LAB — CBC
HCT: 26.3 % — ABNORMAL LOW (ref 39.0–52.0)
Hemoglobin: 9.1 g/dL — ABNORMAL LOW (ref 13.0–17.0)
MCH: 36 pg — ABNORMAL HIGH (ref 26.0–34.0)
MCHC: 34.6 g/dL (ref 30.0–36.0)
MCV: 104 fL — ABNORMAL HIGH (ref 80.0–100.0)
Platelets: 71 10*3/uL — ABNORMAL LOW (ref 150–400)
RBC: 2.53 MIL/uL — ABNORMAL LOW (ref 4.22–5.81)
RDW: 13.2 % (ref 11.5–15.5)
WBC: 4.5 10*3/uL (ref 4.0–10.5)
nRBC: 0 % (ref 0.0–0.2)

## 2023-03-30 MED ORDER — PANTOPRAZOLE SODIUM 40 MG PO TBEC: 40.0000 mg | DELAYED_RELEASE_TABLET | Freq: Two times a day (BID) | ORAL | 1 refills | Status: DC

## 2023-03-30 MED ORDER — FOLIC ACID 1 MG PO TABS: 1.0000 mg | ORAL_TABLET | Freq: Every day | ORAL | 0 refills | Status: DC

## 2023-03-30 MED ORDER — FERROUS SULFATE 325 (65 FE) MG PO TBEC: 325.0000 mg | DELAYED_RELEASE_TABLET | Freq: Two times a day (BID) | ORAL | 0 refills | Status: DC

## 2023-03-30 NOTE — Plan of Care (Signed)

## 2023-03-30 NOTE — Discharge Summary (Signed)
 Physician Discharge Summary   Patient: Gregory Singh MRN: [id number] DOB: September 22, 1960  Admit date:     03/28/2023  Discharge date: 03/30/23  Discharge Physician: Owen DELENA Lore   PCP: Berneta Elsie Sayre, MD   Recommendations at discharge:   Follow up with GI for biopsy results and to determine if he needs repeat endoscopy Follow up with PCP to determine if BP medications can be resume Needs CBC to follow hb.   Discharge Diagnoses: Principal Problem:   Upper GI bleed Active Problems:   Hematemesis   Acute blood loss anemia   Syncope, vasovagal   Elevated troponin   Alcohol abuse   Hepatitis B   Cirrhosis (HCC)   Thrombocytopenia (HCC)   Cigarette smoker  Resolved Problems:   * No resolved hospital problems. *  Hospital Course: 63 year old with past medical history significant for hypertension, hepatitis B, alcohol abuse, presents with nausea vomiting and black stool.  Symptoms started around 2 AM the morning of admission.  While waiting in the hospital the patient experienced an episode of syncope in the bathroom.  Patient had a right elbow laceration that was sutured in the ED.    Assessment and Plan: 1-Hematemesis, melena acute blood loss anemia: could be related to gastritis, esophagitis.  -Patient presenting with coffee-ground emesis, abdominal pain. -Hemoglobin has decreased to 9 from 12 on admission -Underwent endoscopy which showed gastric ulcer clean base, gastritis, esophagitis Discharge on PPI BID.  Needs to follow up with GI.  Hb remain stable. Start ferrous sulfate  on Monday   Syncopal: In the setting of hypovolemia, anemia Orthostatic negative after hydration.  Continue to hold BP medications at discharge   Elevation of troponin: In the setting of acute blood loss.    Alcohol abuse: -Monitor  CIWA -No evidence of withdrawal.   Transaminases: In the setting of alcohol use.  Monitor   Hyperglycemia: A1c: 5.4   Cirrhosis, chronic hepatitis B: On  chronic hepatitis B with tenofovir .     Thrombocytopenia Continue to monitor   Tobacco abuse Counseling.               Consultants: GI Procedures performed: None Disposition: Home Diet recommendation:  Discharge Diet Orders (From admission, onward)     Start     Ordered   03/30/23 0000  Diet - low sodium heart healthy        03/30/23 1138           Cardiac diet DISCHARGE MEDICATION: Allergies as of 03/30/2023   No Known Allergies      Medication List     STOP taking these medications    hydrochlorothiazide  25 MG tablet Commonly known as: HYDRODIURIL    potassium chloride  SA 20 MEQ tablet Commonly known as: KLOR-CON  M       TAKE these medications    b complex vitamins capsule Take 1 capsule by mouth daily.   clobetasol  ointment 0.05 % Commonly known as: TEMOVATE  Apply bid to rash on leg liberally, do not use on face or genitals   ferrous sulfate  325 (65 FE) MG EC tablet Take 1 tablet (325 mg total) by mouth 2 (two) times daily. Start taking on: April 01, 2023   folic acid  1 MG tablet Commonly known as: FOLVITE  Take 1 tablet (1 mg total) by mouth daily. Start taking on: March 31, 2023   gemfibrozil  600 MG tablet Commonly known as: LOPID  TAKE 1 TABLET(600 MG) BY MOUTH TWICE DAILY   multivitamin with iron-minerals liquid Take by mouth daily.  omega-3 acid ethyl esters 1 g capsule Commonly known as: LOVAZA  Take 2 capsules (2 g total) by mouth daily.   pantoprazole  40 MG tablet Commonly known as: PROTONIX  Take 1 tablet (40 mg total) by mouth 2 (two) times daily before a meal.   sildenafil  100 MG tablet Commonly known as: Viagra  Take 0.5-1 tablets (50-100 mg total) by mouth daily as needed for erectile dysfunction.   tenofovir  300 MG tablet Commonly known as: VIREAD  Take 1 tablet (300 mg total) by mouth daily.        Follow-up Information     Nandigam, Kavitha V, MD Follow up in 1 month(s).   Specialty:  Gastroenterology Contact information: [address] [place] [address] [phone]         Berneta Elsie Sayre, MD Follow up in 1 week(s).   Specialty: Family Medicine Contact information: [address] [place] [address] [phone]                Discharge Exam: Fredricka Weights   03/28/23 1047 03/29/23 1140  Weight: 59 kg 56.7 kg   General; NAD  Condition at discharge: stable  The results of significant diagnostics from this hospitalization (including imaging, microbiology, ancillary and laboratory) are listed below for reference.   Imaging Studies: CT Head Wo Contrast Result Date: 03/28/2023 CLINICAL DATA:  Head trauma. Moderate-to-severe. Coffee-ground emesis. Chest pain. EXAM: CT HEAD WITHOUT CONTRAST TECHNIQUE: Contiguous axial images were obtained from the base of the skull through the vertex without intravenous contrast. RADIATION DOSE REDUCTION: This exam was performed according to the departmental dose-optimization program which includes automated exposure control, adjustment of the mA and/or kV according to patient size and/or use of iterative reconstruction technique. COMPARISON:  None Available. FINDINGS: Brain: No evidence of acute infarction, hemorrhage, hydrocephalus, extra-axial collection or mass lesion/mass effect. There is bilateral periventricular hypodensity, which is non-specific but most likely seen in the settings of microvascular ischemic changes. Minimal in extent. Otherwise normal appearance of brain parenchyma. Ventricles are normal. Cerebral volume is age appropriate. Vascular: No hyperdense vessel or unexpected calcification. Intracranial arteriosclerosis. Skull: Normal. Negative for fracture or focal lesion. Sinuses/Orbits: No acute finding. Other: Visualized mastoid air cells are unremarkable. No mastoid effusion. IMPRESSION: *No acute intracranial abnormality. Electronically Signed   By: Ree Molt M.D.   On: 03/28/2023  14:00   CT ANGIO GI BLEED Result Date: 03/28/2023 CLINICAL DATA:  GI bleed.  Coffee-ground emesis.  Chest pain. EXAM: CTA ABDOMEN AND PELVIS WITHOUT AND WITH CONTRAST TECHNIQUE: Multidetector CT imaging of the abdomen and pelvis was performed using the standard protocol during bolus administration of intravenous contrast. Multiplanar reconstructed images and MIPs were obtained and reviewed to evaluate the vascular anatomy. RADIATION DOSE REDUCTION: This exam was performed according to the departmental dose-optimization program which includes automated exposure control, adjustment of the mA and/or kV according to patient size and/or use of iterative reconstruction technique. CONTRAST:  60mL OMNIPAQUE  IOHEXOL  350 MG/ML SOLN COMPARISON:  None Available. FINDINGS: VASCULAR Aorta: Normal caliber aorta without aneurysm, dissection, vasculitis or significant stenosis. Celiac: Patent without evidence of aneurysm, dissection or vasculitis. Note is made of moderate narrowing at the origin due to median arcuate ligament. SMA: Patent without evidence of aneurysm, dissection, vasculitis or significant stenosis. Renals: Both renal arteries are patent without evidence of aneurysm, dissection, vasculitis, fibromuscular dysplasia or significant stenosis. IMA: Patent without evidence of aneurysm, dissection, vasculitis or significant stenosis. Inflow: Patent without evidence of aneurysm, dissection, vasculitis or significant stenosis. Proximal Outflow: Bilateral  common femoral and visualized portions of the superficial and profunda femoral arteries are patent without evidence of aneurysm, dissection, vasculitis or significant stenosis. Veins: No obvious venous abnormality within the limitations of this arterial phase study. Review of the MIP images confirms the above findings. NON-VASCULAR Lower chest: There are dependent atelectatic changes in the visualized lung bases. No overt consolidation. No pleural effusion. The heart is  normal in size. No pericardial effusion. Hepatobiliary: The liver is normal in size. There is mild liver surface irregularity/nodularity,, favoring cirrhosis. No suspicious liver lesions seen.No intrahepatic or extrahepatic bile duct dilation. No calcified gallstones. Normal gallbladder wall thickness. No pericholecystic inflammatory changes. Pancreas: Unremarkable. No pancreatic ductal dilatation or surrounding inflammatory changes. Spleen: Within normal limits. No focal lesion. Adrenals/Urinary Tract: Adrenal glands are unremarkable. No suspicious renal mass. There are several subcentimeter sized simple cysts in the right kidney lower pole. No hydronephrosis. No renal or ureteric calculi. Urinary bladder is under distended, precluding optimal assessment. However, there is mild diffuse circumferential thickening of the urinary bladder without perivesical fat stranding, likely sequela of chronic cystitis. Correlate clinically and with urinalysis. No large mass or stones identified. Stomach/Bowel: There is mild fullness in the gastroesophageal junction region and few varices in the lower esophagus. No disproportionate dilation of the small or large bowel loops. No evidence of abnormal bowel wall thickening or inflammatory changes. The appendix is unremarkable. There are scattered diverticula predominantly in the right hemicolon, without imaging signs of diverticulitis. No acute intravasation of contrast. Vascular/Lymphatic: No ascites or pneumoperitoneum. No abdominal or pelvic lymphadenopathy, by size criteria. Reproductive: Normal size prostate. Symmetric seminal vesicles. Other: The visualized soft tissues and abdominal wall are unremarkable. Musculoskeletal: No suspicious osseous lesions. There are mild multilevel degenerative changes in the visualized spine. 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 IMPRESSION: 1. No CT evidence of active extravasation of contrast/GI bleeding. 2. No acute  inflammatory process identified throughout the abdomen or pelvis. No bowel obstruction. 3. Cirrhotic liver configuration. Mild fullness and few varices in the lower thoracic esophagus/GE junction region. Consider better evaluation with upper GI endoscopy. No splenomegaly or ascites. 4. Multiple other nonacute observations, as described above. Electronically Signed   By: Ree Molt M.D.   On: 03/28/2023 13:57   DG Chest 2 View Result Date: 03/28/2023 CLINICAL DATA:  Chest pain. EXAM: CHEST - 2 VIEW COMPARISON:  Chest radiograph dated 07/19/2020. FINDINGS: No focal consolidation, pleural effusion, or pneumothorax. The cardiac silhouette is within normal limits. Degenerative changes of the spine. No acute osseous pathology. IMPRESSION: No active cardiopulmonary disease. Electronically Signed   By: Vanetta Chou M.D.   On: 03/28/2023 11:20    Microbiology: Results for orders placed or performed in visit on 03/14/21  SARS Coronavirus 2 (TAT 6-24 hrs)     Status: None   Collection Time: 03/14/21 12:00 AM  Result Value Ref Range Status   SARS Coronavirus 2 RESULT: NEGATIVE  Final    Comment: RESULT: NEGATIVESARS-CoV-2 INTERPRETATION:A NEGATIVE  test result means that SARS-CoV-2 RNA was not present in the specimen above the limit of detection of this test. This does not preclude a possible SARS-CoV-2 infection and should not be used as the  sole basis for patient management decisions. Negative results must be combined with clinical observations, patient history, and epidemiological information. Optimum specimen types and timing for peak viral levels during infections caused by SARS-CoV-2  have not been determined. Collection of multiple specimens or types of specimens may be necessary to detect virus.  Improper specimen collection and handling, sequence variability under primers/probes, or organism present below the limit of detection may  lead to false negative results. Positive and negative predictive  values of testing are highly dependent on prevalence. False negative test results are more likely when prevalence of disease is high.The expected result is NEGATIVE.Fact S heet for  Healthcare Providers: [employer]gl Sheet for Patients: Https://poole-freeman.org/ Reference Range - Negative     Labs: CBC: Recent Labs  Lab 03/28/23 1053 03/28/23 1300 03/28/23 2110 03/29/23 0449 03/29/23 1223 03/30/23 0852  WBC 11.1*  --   --  9.8  --  4.5  HGB 12.5* 12.0* 9.5* 9.6* 9.0* 9.1*  HCT 36.3* 34.1* 27.3* 28.1* 25.8* 26.3*  MCV 102.8*  --   --  103.3*  --  104.0*  PLT 113*  --   --  85*  --  71*   Basic Metabolic Panel: Recent Labs  Lab 03/28/23 1053 03/29/23 0449  NA 135 141  K 3.9 3.6  CL 98 107  CO2 26 24  GLUCOSE 266* 128*  BUN 32* 21*  CREATININE 0.90 0.88  CALCIUM  9.0 8.4*   Liver Function Tests: Recent Labs  Lab 03/28/23 1053 03/29/23 0449  AST 51* 43*  ALT 44 33  ALKPHOS 122 73  BILITOT 0.8 1.0  PROT 6.2* 5.1*  ALBUMIN 2.9* 2.4*   CBG: No results for input(s): GLUCAP in the last 168 hours.  Discharge time spent: greater than 30 minutes.  Signed: Owen DELENA Lore, MD [facility] 03/30/2023

## 2023-04-01 ENCOUNTER — Encounter (HOSPITAL_COMMUNITY): Payer: Self-pay | Admitting: Gastroenterology

## 2023-04-02 LAB — SURGICAL PATHOLOGY

## 2023-04-03 ENCOUNTER — Other Ambulatory Visit: Payer: Self-pay | Admitting: Family Medicine

## 2023-04-03 DIAGNOSIS — I10 Essential (primary) hypertension: Secondary | ICD-10-CM

## 2023-04-04 ENCOUNTER — Encounter: Payer: Self-pay | Admitting: Internal Medicine

## 2023-04-04 ENCOUNTER — Other Ambulatory Visit: Payer: Self-pay

## 2023-04-04 ENCOUNTER — Ambulatory Visit (INDEPENDENT_AMBULATORY_CARE_PROVIDER_SITE_OTHER): Payer: Commercial Managed Care - HMO | Admitting: Internal Medicine

## 2023-04-04 VITALS — BP 124/74 | HR 74 | Temp 98.1°F | Wt 131.6 lb

## 2023-04-04 DIAGNOSIS — D62 Acute posthemorrhagic anemia: Secondary | ICD-10-CM

## 2023-04-04 DIAGNOSIS — F1021 Alcohol dependence, in remission: Secondary | ICD-10-CM

## 2023-04-04 DIAGNOSIS — B181 Chronic viral hepatitis B without delta-agent: Secondary | ICD-10-CM

## 2023-04-04 DIAGNOSIS — Z79899 Other long term (current) drug therapy: Secondary | ICD-10-CM

## 2023-04-04 DIAGNOSIS — Z789 Other specified health status: Secondary | ICD-10-CM

## 2023-04-04 MED ORDER — TENOFOVIR DISOPROXIL FUMARATE 300 MG PO TABS: 300.0000 mg | ORAL_TABLET | Freq: Every day | ORAL | 11 refills | Status: DC

## 2023-04-04 NOTE — Progress Notes (Signed)
Patient ID: Gregory Singh, male   DOB: 08-Dec-1960, 63 y.o.   MRN: [id number]  HPI Mr Beevers is a 63yo [place] male with chronic hepatitis B, on tenofovir, however he was admitted to the hospital last week for acute GI bleed found to be unconscious in the bathroom. Since his admission, he has stopped eoth drinking as well as stopped smoking. He is feeling well. He states that he did drink to help with insomnia.  He is tolerating how to have better sleep hygiene.  Reviewed hospitalization labs and imaging. Only slight transaminitis. Hgb down from 16 to 9. Currently on iron supplementation   Outpatient Encounter Medications as of 04/04/2023  Medication Sig   b complex vitamins capsule Take 1 capsule by mouth daily.   clobetasol ointment (TEMOVATE) 0.05 % Apply bid to rash on leg liberally, do not use on face or genitals   ferrous sulfate 325 (65 FE) MG EC tablet Take 1 tablet (325 mg total) by mouth 2 (two) times daily.   folic acid (FOLVITE) 1 MG tablet Take 1 tablet (1 mg total) by mouth daily.   gemfibrozil (LOPID) 600 MG tablet TAKE 1 TABLET(600 MG) BY MOUTH TWICE DAILY   Multiple Vitamins-Minerals (MULTIVITAMIN WITH IRON-MINERALS) liquid Take by mouth daily.   omega-3 acid ethyl esters (LOVAZA) 1 g capsule Take 2 capsules (2 g total) by mouth daily.   pantoprazole (PROTONIX) 40 MG tablet Take 1 tablet (40 mg total) by mouth 2 (two) times daily before a meal.   sildenafil (VIAGRA) 100 MG tablet Take 0.5-1 tablets (50-100 mg total) by mouth daily as needed for erectile dysfunction.   tenofovir (VIREAD) 300 MG tablet Take 1 tablet (300 mg total) by mouth daily.   No facility-administered encounter medications on file as of 04/04/2023.     Patient Active Problem List   Diagnosis Date Noted   Upper GI bleed 03/28/2023   Hematemesis 03/28/2023   Syncope, vasovagal 03/28/2023   Acute blood loss anemia 03/28/2023   Elevated troponin 03/28/2023   Alcohol abuse 03/28/2023   Cirrhosis (HCC)  03/28/2023   COPD  GOLD 0/ active smoker 11/08/2020   Cigarette smoker 11/08/2020   Hepatitis B 01/19/2015   Thrombocytopenia (HCC) 01/10/2015   Tremor of both hands 01/10/2015   High triglycerides 01/10/2015   Neck pain 05/21/2011   Shoulder pain 05/21/2011   Neck pain on right side 05/15/2011     Health Maintenance Due  Topic Date Due   Pneumococcal Vaccine 32-56 Years old (1 of 2 - PCV) Never done     Review of Systems 12 point ros is negative Physical Exam   BP 124/74   Pulse 74   Temp 98.1 F (36.7 C) (Oral)   Wt 131 lb 9.6 oz (59.7 kg)   SpO2 98%   BMI 21.24 kg/m    Physical Exam  Constitutional: He is oriented to person, place, and time. He appears well-developed and well-nourished. No distress.  HENT:  Mouth/Throat: Oropharynx is clear and moist. No oropharyngeal exudate.  Cardiovascular: Normal rate, regular rhythm and normal heart sounds. Exam reveals no gallop and no friction rub.  No murmur heard.  Pulmonary/Chest: Effort normal and breath sounds normal. No respiratory distress. He has no wheezes.  Abdominal: Soft. Bowel sounds are normal. He exhibits no distension. There is no tenderness.  Lymphadenopathy:  He has no cervical adenopathy.  Neurological: He is alert and oriented to person, place, and time.  Skin: Skin is warm and dry. No rash  noted. No erythema.  Psychiatric: He has a normal mood and affect. His behavior is normal.   Lab Results  Component Value Date   HEPBSAB NON-REACTIVE 10/02/2022   No results found for: "RPR", "LABRPR"  CBC Lab Results  Component Value Date   WBC 4.5 03/30/2023   RBC 2.53 (L) 03/30/2023   HGB 9.1 (L) 03/30/2023   HCT 26.3 (L) 03/30/2023   PLT 71 (L) 03/30/2023   MCV 104.0 (H) 03/30/2023   MCH 36.0 (H) 03/30/2023   MCHC 34.6 03/30/2023   RDW 13.2 03/30/2023   LYMPHSABS 2,366 03/22/2022   MONOABS 477 04/24/2016   EOSABS 129 03/22/2022    BMET Lab Results  Component Value Date   NA 141 03/29/2023   K  3.6 03/29/2023   CL 107 03/29/2023   CO2 24 03/29/2023   GLUCOSE 128 (H) 03/29/2023   BUN 21 (H) 03/29/2023   CREATININE 0.88 03/29/2023   CALCIUM 8.4 (L) 03/29/2023   GFRNONAA >60 03/29/2023   GFRAA 115 11/26/2018      Assessment and Plan Chronic hepatitis b = continue on tenofovir, will check hepatitis b VL  Anemia (GI blood loss) = we will see back in 4 months to see that his cbc is back to normal; continue with iron supplementation; plan to go see gi in 1 month  Alcohol dependence =  He has stopped dirnking, after 42 yrs! Also decreased smoking

## 2023-04-07 LAB — HEPATITIS B DNA, ULTRAQUANTITATIVE, PCR
Hepatitis B DNA: NOT DETECTED [IU]/mL
Hepatitis B virus DNA: NOT DETECTED {Log}

## 2023-04-09 ENCOUNTER — Ambulatory Visit: Payer: Commercial Managed Care - HMO | Admitting: Family Medicine

## 2023-04-09 ENCOUNTER — Encounter: Payer: Self-pay | Admitting: Family Medicine

## 2023-04-09 VITALS — BP 120/68 | HR 62 | Temp 98.3°F | Ht 60.0 in | Wt 128.2 lb

## 2023-04-09 DIAGNOSIS — D62 Acute posthemorrhagic anemia: Secondary | ICD-10-CM

## 2023-04-09 DIAGNOSIS — Z4802 Encounter for removal of sutures: Secondary | ICD-10-CM | POA: Diagnosis not present

## 2023-04-09 DIAGNOSIS — E538 Deficiency of other specified B group vitamins: Secondary | ICD-10-CM | POA: Diagnosis not present

## 2023-04-09 DIAGNOSIS — Z09 Encounter for follow-up examination after completed treatment for conditions other than malignant neoplasm: Secondary | ICD-10-CM | POA: Insufficient documentation

## 2023-04-09 LAB — CBC
HCT: 33.7 % — ABNORMAL LOW (ref 39.0–52.0)
Hemoglobin: 11.2 g/dL — ABNORMAL LOW (ref 13.0–17.0)
MCHC: 33.3 g/dL (ref 30.0–36.0)
MCV: 108 fL — ABNORMAL HIGH (ref 78.0–100.0)
Platelets: 133 10*3/uL — ABNORMAL LOW (ref 150.0–400.0)
RBC: 3.12 Mil/uL — ABNORMAL LOW (ref 4.22–5.81)
RDW: 13.7 % (ref 11.5–15.5)
WBC: 4.7 10*3/uL (ref 4.0–10.5)

## 2023-04-09 MED ORDER — FERROUS SULFATE 325 (65 FE) MG PO TBEC: 325.0000 mg | DELAYED_RELEASE_TABLET | Freq: Two times a day (BID) | ORAL | 0 refills | Status: AC

## 2023-04-09 NOTE — Progress Notes (Signed)
Established Patient Office Visit   Subjective:  Patient ID: Gregory Singh, male    DOB: 05-Jun-1960  Age: 63 y.o. MRN: [id number]  No chief complaint on file.   HPI Encounter Diagnoses  Name Primary?   Hospital discharge follow-up Yes   Visit for suture removal    Acute blood loss anemia    Folic acid deficiency    Here with his wife for hospital discharge follow-up status post acute GI bleed.  Endoscopy revealed a gastric ulcer.  He had experienced coffee-ground emesis.  HCTZ with potassium were held.  He has stopped drinking altogether.  Continues to smoke 3 cigarettes daily.  Status post right eyebrow laceration.  Sutures were placed 13 days ago and need to removed that had been placed in the emergency room. Patient Active Problem List   Diagnosis Date Noted   Folic acid deficiency 04/09/2023   Visit for suture removal 04/09/2023   Hospital discharge follow-up 04/09/2023   Upper GI bleed 03/28/2023   Hematemesis 03/28/2023   Syncope, vasovagal 03/28/2023   Acute blood loss anemia 03/28/2023   Elevated troponin 03/28/2023   Alcohol abuse 03/28/2023   Cirrhosis (HCC) 03/28/2023   COPD  GOLD 0/ active smoker 11/08/2020   Cigarette smoker 11/08/2020   Hepatitis B 01/19/2015   Thrombocytopenia (HCC) 01/10/2015   Tremor of both hands 01/10/2015   High triglycerides 01/10/2015   Neck pain 05/21/2011   Shoulder pain 05/21/2011   Neck pain on right side 05/15/2011      Review of Systems  Constitutional: Negative.   HENT: Negative.    Eyes:  Negative for blurred vision, discharge and redness.  Respiratory: Negative.    Cardiovascular: Negative.   Gastrointestinal:  Negative for abdominal pain, blood in stool, melena, nausea and vomiting.  Genitourinary: Negative.   Musculoskeletal: Negative.  Negative for myalgias.  Skin:  Negative for rash.  Neurological:  Negative for tingling, loss of consciousness and weakness.  Endo/Heme/Allergies:  Negative for polydipsia.      Current Outpatient Medications:    b complex vitamins capsule, Take 1 capsule by mouth daily., Disp: , Rfl:    clobetasol ointment (TEMOVATE) 0.05 %, Apply bid to rash on leg liberally, do not use on face or genitals, Disp: 60 g, Rfl: 1   folic acid (FOLVITE) 1 MG tablet, Take 1 tablet (1 mg total) by mouth daily., Disp: 30 tablet, Rfl: 0   gemfibrozil (LOPID) 600 MG tablet, TAKE 1 TABLET(600 MG) BY MOUTH TWICE DAILY, Disp: 180 tablet, Rfl: 3   Multiple Vitamins-Minerals (MULTIVITAMIN WITH IRON-MINERALS) liquid, Take by mouth daily., Disp: , Rfl:    omega-3 acid ethyl esters (LOVAZA) 1 g capsule, Take 2 capsules (2 g total) by mouth daily., Disp: 30 capsule, Rfl: 2   pantoprazole (PROTONIX) 40 MG tablet, Take 1 tablet (40 mg total) by mouth 2 (two) times daily before a meal., Disp: 60 tablet, Rfl: 1   sildenafil (VIAGRA) 100 MG tablet, Take 0.5-1 tablets (50-100 mg total) by mouth daily as needed for erectile dysfunction., Disp: 20 tablet, Rfl: 6   ferrous sulfate 325 (65 FE) MG EC tablet, Take 1 tablet (325 mg total) by mouth 2 (two) times daily., Disp: 60 tablet, Rfl: 0   tenofovir (VIREAD) 300 MG tablet, Take 1 tablet (300 mg total) by mouth daily., Disp: 30 tablet, Rfl: 11   Objective:     BP 120/68 (BP Location: Right Arm, Patient Position: Sitting)   Pulse 62   Temp 98.3 F (36.8 C) (  Temporal)   Ht 5' (1.524 m)   Wt 128 lb 3.2 oz (58.2 kg)   SpO2 99%   BMI 25.04 kg/m    Physical Exam Constitutional:      General: He is not in acute distress.    Appearance: Normal appearance. He is not ill-appearing, toxic-appearing or diaphoretic.  HENT:     Head: Normocephalic and atraumatic.     Right Ear: External ear normal.     Left Ear: External ear normal.     Mouth/Throat:     Mouth: Mucous membranes are moist.     Pharynx: Oropharynx is clear. No oropharyngeal exudate or posterior oropharyngeal erythema.  Eyes:     General: No scleral icterus.       Right eye: No  discharge.        Left eye: No discharge.     Extraocular Movements: Extraocular movements intact.     Conjunctiva/sclera: Conjunctivae normal.     Pupils: Pupils are equal, round, and reactive to light.  Cardiovascular:     Rate and Rhythm: Normal rate and regular rhythm.  Pulmonary:     Effort: Pulmonary effort is normal. No respiratory distress.     Breath sounds: Normal breath sounds. No wheezing or rales.  Abdominal:     General: Bowel sounds are normal.     Tenderness: There is no abdominal tenderness. There is no guarding or rebound.  Musculoskeletal:     Cervical back: No rigidity or tenderness.  Skin:    General: Skin is warm and dry.  Neurological:     Mental Status: He is alert and oriented to person, place, and time.  Psychiatric:        Mood and Affect: Mood normal.        Behavior: Behavior normal.    Subjective:    Gregory Singh is a 63 y.o. male who obtained a laceration  13  days ago, which required closure with 3 suture. Mechanism of injury: Fall. He denies pain, redness, or drainage from the wound. His last tetanus was 7 years ago.  The following portions of the patient's history were reviewed and updated as appropriate: He  has a past medical history of Hypertension. He does not have any pertinent problems on file. He  has a past surgical history that includes Esophagogastroduodenoscopy (egd) with propofol (N/A, 03/29/2023) and biopsy (03/29/2023). His family history includes Diabetes in his father; Healthy in his mother; Stroke in his father. He  reports that he has been smoking cigarettes. He started smoking about 43 years ago. He has a 10.8 pack-year smoking history. He has never used smokeless tobacco. He reports current alcohol use of about 24.0 standard drinks of alcohol per week. He reports that he does not use drugs. He has a current medication list which includes the following prescription(s): b complex vitamins, clobetasol ointment, folic acid, gemfibrozil,  multivitamin with iron-minerals, omega-3 acid ethyl esters, pantoprazole, sildenafil, ferrous sulfate, and tenofovir. Current Outpatient Medications on File Prior to Visit  Medication Sig Dispense Refill   b complex vitamins capsule Take 1 capsule by mouth daily.     clobetasol ointment (TEMOVATE) 0.05 % Apply bid to rash on leg liberally, do not use on face or genitals 60 g 1   folic acid (FOLVITE) 1 MG tablet Take 1 tablet (1 mg total) by mouth daily. 30 tablet 0   gemfibrozil (LOPID) 600 MG tablet TAKE 1 TABLET(600 MG) BY MOUTH TWICE DAILY 180 tablet 3   Multiple Vitamins-Minerals (MULTIVITAMIN  WITH IRON-MINERALS) liquid Take by mouth daily.     omega-3 acid ethyl esters (LOVAZA) 1 g capsule Take 2 capsules (2 g total) by mouth daily. 30 capsule 2   pantoprazole (PROTONIX) 40 MG tablet Take 1 tablet (40 mg total) by mouth 2 (two) times daily before a meal. 60 tablet 1   sildenafil (VIAGRA) 100 MG tablet Take 0.5-1 tablets (50-100 mg total) by mouth daily as needed for erectile dysfunction. 20 tablet 6   tenofovir (VIREAD) 300 MG tablet Take 1 tablet (300 mg total) by mouth daily. 30 tablet 11   No current facility-administered medications on file prior to visit.   He has no known allergies. .  Review of Systems Pertinent items are noted in HPI.    Objective:    BP 120/68 (BP Location: Right Arm, Patient Position: Sitting)   Pulse 62   Temp 98.3 F (36.8 C) (Temporal)   Ht 5' (1.524 m)   Wt 128 lb 3.2 oz (58.2 kg)   SpO2 99%   BMI 25.04 kg/m  Injury exam:  A 2 cm laceration noted on the right eye brow is healing well, without evidence of infection.    Assessment:    Laceration is healing well, without evidence of infection.    Plan:     1. 3 suture were removed. 2. Wound care discussed. 3. Follow up as needed.   No results found for any visits on 04/09/23.    The 10-year ASCVD risk score (Arnett DK, et al., 2019) is: 9.9%    Assessment & Plan:   Hospital  discharge follow-up  Visit for suture removal  Acute blood loss anemia -     CBC -     Ferrous Sulfate; Take 1 tablet (325 mg total) by mouth 2 (two) times daily.  Dispense: 60 tablet; Refill: 0  Folic acid deficiency    Return Should have follow-up appointment in April., for chronic disease follow-up.  Patient remains normotensive off of HCTZ.  He has a cuff at home and will be checking his pressures daily.  He will let me know if they start averaging over 140/90.  Encouraged continued alcohol abstinence.  Recheck hemoglobin today.  Continue iron and folic acid.  Encouraged him to go ahead and stop smoking altogether.  Mliss Sax, MD

## 2023-04-10 ENCOUNTER — Ambulatory Visit
Admission: RE | Admit: 2023-04-10 | Discharge: 2023-04-10 | Disposition: A | Discharge status: Home / Self Care | Payer: Commercial Managed Care - HMO | Source: Ambulatory Visit | Attending: Acute Care | Admitting: Acute Care

## 2023-04-10 DIAGNOSIS — R911 Solitary pulmonary nodule: Secondary | ICD-10-CM

## 2023-04-16 ENCOUNTER — Encounter: Payer: Self-pay | Admitting: Gastroenterology

## 2023-04-17 ENCOUNTER — Other Ambulatory Visit: Payer: Self-pay | Admitting: Acute Care

## 2023-04-17 DIAGNOSIS — Z122 Encounter for screening for malignant neoplasm of respiratory organs: Secondary | ICD-10-CM

## 2023-04-17 DIAGNOSIS — Z87891 Personal history of nicotine dependence: Secondary | ICD-10-CM

## 2023-04-17 DIAGNOSIS — F17210 Nicotine dependence, cigarettes, uncomplicated: Secondary | ICD-10-CM

## 2023-05-07 ENCOUNTER — Encounter: Payer: Self-pay | Admitting: Gastroenterology

## 2023-05-07 ENCOUNTER — Ambulatory Visit (INDEPENDENT_AMBULATORY_CARE_PROVIDER_SITE_OTHER): Payer: Commercial Managed Care - HMO | Admitting: Gastroenterology

## 2023-05-07 VITALS — BP 118/64 | HR 66 | Ht 60.0 in | Wt 135.0 lb

## 2023-05-07 DIAGNOSIS — Z860100 Personal history of colon polyps, unspecified: Secondary | ICD-10-CM

## 2023-05-07 DIAGNOSIS — K922 Gastrointestinal hemorrhage, unspecified: Secondary | ICD-10-CM

## 2023-05-07 MED ORDER — FOLIC ACID 1 MG PO TABS: 1.0000 mg | ORAL_TABLET | Freq: Every day | ORAL | 3 refills | Status: DC

## 2023-05-07 MED ORDER — SUFLAVE 178.7 G PO SOLR: 1.0000 | Freq: Once | ORAL | 0 refills | Status: AC

## 2023-05-07 MED ORDER — PANTOPRAZOLE SODIUM 40 MG PO TBEC: 40.0000 mg | DELAYED_RELEASE_TABLET | Freq: Two times a day (BID) | ORAL | 3 refills | Status: DC

## 2023-05-07 NOTE — Progress Notes (Signed)
05/07/2023 Gregory Singh [id number] [birth year]   HISTORY OF PRESENT ILLNESS: This is a pleasant 63 year old [name] who is here for hospital follow-up.  He was hospitalized for a couple of days in January for an upper GI bleed.  Found to have grade B reflux esophagitis and nonbleeding gastric ulcers as below.  H. pylori negative on biopsies.  He uses only rare Advil.  He says that he had been on some medication while he was in [place] so not sure if that contributed.  He is on pantoprazole 40 mg twice daily and will need that refilled.  It also put on ferrous sulfate and he completed that, but has run out.  He has cirrhosis with a low MELD due to hep B and alcohol use, follows with infectious disease, is on tenofovir.  While he was taking the ferrous sulfate his stools were dark, but since he has discontinued that he has had no further dark stools.  No further evidence of gastrointestinal bleeding.  No complaints.  He says he moves his bowels regularly every morning.  Last Hgb one month ago was improved at 11.2 grams, up from 9 grams when he was in the hospital a couple of weeks prior.  EGD 03/2023:  - Z- line regular, 40 cm from the incisors. - LA Grade B reflux esophagitis with no bleeding. - Non- bleeding gastric ulcers with no stigmata of bleeding. Biopsied. - Gastritis. Biopsied. - Normal examined duodenum.  A. STOMACH, ANTRUM AND BODY, BIOPSY:       Gastric antral / oxyntic mucosa with chronic inactive gastritis and  intestinal metaplasia.       No H. pylori identified on HE and immunostain.       No evidence for neuroendocrine hyperplasia.       Negative for dysplasia or malignancy.   Colonoscopy 10/2022: Unsatisfactory prep, procedure not complete.   Past Medical History:  Diagnosis Date   Hypertension    Past Surgical History:  Procedure Laterality Date   BIOPSY  03/29/2023   Procedure: BIOPSY;  Surgeon: Napoleon Form, MD;  Location: [facility];  Service:  Gastroenterology;;   ESOPHAGOGASTRODUODENOSCOPY (EGD) WITH PROPOFOL N/A 03/29/2023   Procedure: ESOPHAGOGASTRODUODENOSCOPY (EGD) WITH PROPOFOL;  Surgeon: Napoleon Form, MD;  Location: [facility] ENDOS[facility];  Service: Gastroenterology;  Laterality: N/A;    reports that he has been smoking cigarettes. He started smoking about 43 years ago. He has a 10.8 pack-year smoking history. He has never used smokeless tobacco. He reports current alcohol use of about 24.0 standard drinks of alcohol per week. He reports that he does not use drugs. family history includes Diabetes in his father; Healthy in his mother; Stroke in his father. No Known Allergies    Outpatient Encounter Medications as of 05/07/2023  Medication Sig   b complex vitamins capsule Take 1 capsule by mouth daily.   clobetasol ointment (TEMOVATE) 0.05 % Apply bid to rash on leg liberally, do not use on face or genitals   ferrous sulfate 325 (65 FE) MG EC tablet Take 1 tablet (325 mg total) by mouth 2 (two) times daily.   folic acid (FOLVITE) 1 MG tablet Take 1 tablet (1 mg total) by mouth daily.   gemfibrozil (LOPID) 600 MG tablet TAKE 1 TABLET(600 MG) BY MOUTH TWICE DAILY   Multiple Vitamins-Minerals (MULTIVITAMIN WITH IRON-MINERALS) liquid Take by mouth daily.   omega-3 acid ethyl esters (LOVAZA) 1 g capsule Take 2 capsules (2 g total) by mouth daily.  pantoprazole (PROTONIX) 40 MG tablet Take 1 tablet (40 mg total) by mouth 2 (two) times daily before a meal.   sildenafil (VIAGRA) 100 MG tablet Take 0.5-1 tablets (50-100 mg total) by mouth daily as needed for erectile dysfunction.   tenofovir (VIREAD) 300 MG tablet Take 1 tablet (300 mg total) by mouth daily.   No facility-administered encounter medications on file as of 05/07/2023.    REVIEW OF SYSTEMS  : All other systems reviewed and negative except where noted in the History of Present Illness.   PHYSICAL EXAM: BP 118/64   Pulse 66   Ht 5' (1.524 m)   Wt 135 lb (61.2 kg)   BMI  26.37 kg/m  General: Well developed Asian male in no acute distress Head: Normocephalic and atraumatic Eyes:  Sclerae anicteric, conjunctiva pink. Ears: Normal auditory acuity Lungs: Clear throughout to auscultation; no W/R/R. Heart: Regular rate and rhythm; no M/R/G. Rectal: Will be done at the time of colonoscopy. Musculoskeletal: Symmetrical with no gross deformities  Skin: No lesions on visible extremities Neurological: Alert oriented x 4, grossly non-focal Psychological:  Alert and cooperative. Normal mood and affect  ASSESSMENT AND PLAN: #1 UGIB due to gastric ulcers/Grade B esophagitis seen on EGD 03/2023: Ulcers were nonbleeding.  Biopsies for H. pylori were negative.  Will continue pantoprazole 40 mg twice daily.  Avoid NSAIDs.  New prescription sent to pharmacy.  Hgb increasing nicely.  He has run out of ferrous sulfate, I do not think that we need to continue it further at this point. #2 cirrhosis-likely combination of hepatitis B and EtOH use/abuse:  Follows with infectious disease.  Has a low MELD.  Needs folic acid refilled.  Will send a prescription for that. #3 chronic hepatitis B-on tenofovir.  Follows with infectious disease. #4 History of colon polyps: Had unsatisfactory prep in August 2024 so procedure is not able to be complete.  Will schedule for repeat colonoscopy with Dr. Chales Abrahams with a 2-day bowel prep.  CC:  Mliss Sax,*

## 2023-05-07 NOTE — Patient Instructions (Signed)
We have sent the following medications to your pharmacy for you to pick up at your convenience: Pantoprazole 40 mg twice daily 30-60 minutes before breakfast and dinner.  Folic acid daily.   You have been scheduled for a colonoscopy. Please follow written instructions given to you at your visit today.   If you use inhalers (even only as needed), please bring them with you on the day of your procedure.  DO NOT TAKE 7 DAYS PRIOR TO TEST- Trulicity (dulaglutide) Ozempic, Wegovy (semaglutide) Mounjaro (tirzepatide) Bydureon Bcise (exanatide extended release)  DO NOT TAKE 1 DAY PRIOR TO YOUR TEST Rybelsus (semaglutide) Adlyxin (lixisenatide) Victoza (liraglutide) Byetta (exanatide) ___________________________________________________________________________  Bonita Quin will receive your bowel preparation through [facility], which ensures the lowest copay and home delivery, with outreach via text or call from an [phone] number. Please respond promptly to avoid rescheduling of your procedure. If you are interested in alternative options or have any questions regarding your prep, please contact them at [phone] ____________________________________________________________________________  Your Provider Has Sent Your Bowel Prep Regimen To [facility]   [facility] will contact you to verify your information and collect your copay, if applicable. Enjoy the comfort of your home while your prescription is mailed to you, FREE of any shipping charges.   [facility] accepts all major insurance benefits and applies discounts & coupons.  Have additional questions?   Chat: [email or web address] Call: [phone] Email: [email or web address] NCPDP: [id number]  How will [facility] contact you?  With a Welcome phone call,  a Welcome text and a checkout link in text form.  Texts you receive from [phone] Are NOT Spam.  *To set up delivery, you must complete the checkout process via link or speak  to one of the patient care representatives. If [facility] is unable to reach you, your prescription may be delayed.  To avoid long hold times on the phone, you may also utilize the secure chat feature on the [facility] website to request that they call you back for transaction completion or to expedite your concerns.  _______________________________________________________  If your blood pressure at your visit was 140/90 or greater, please contact your primary care physician to follow up on this.  _______________________________________________________  If you are age 12 or older, your body mass index should be between 23-30. Your Body mass index is 26.37 kg/m. If this is out of the aforementioned range listed, please consider follow up with your Primary Care Provider.  If you are age 15 or younger, your body mass index should be between 19-25. Your Body mass index is 26.37 kg/m. If this is out of the aformentioned range listed, please consider follow up with your Primary Care Provider.   ________________________________________________________  The [facility] providers would like to encourage you to use [facility] to communicate with providers for non-urgent requests or questions.  Due to long hold times on the telephone, sending your provider a message by [facility] may be a faster and more efficient way to get a response.  Please allow 48 business hours for a response.  Please remember that this is for non-urgent requests.  _______________________________________________________

## 2023-06-03 ENCOUNTER — Other Ambulatory Visit (HOSPITAL_COMMUNITY): Payer: Self-pay

## 2023-06-03 ENCOUNTER — Telehealth: Payer: Self-pay | Admitting: Gastroenterology

## 2023-06-03 ENCOUNTER — Telehealth: Payer: Self-pay

## 2023-06-03 MED ORDER — PANTOPRAZOLE SODIUM 40 MG PO TBEC: 40.0000 mg | DELAYED_RELEASE_TABLET | Freq: Two times a day (BID) | ORAL | 3 refills | Status: AC

## 2023-06-03 NOTE — Telephone Encounter (Signed)
 PT is calling to find out if he should stay on pantoprazole. He has no refills left and curious as to what to do now. Please advise.

## 2023-06-03 NOTE — Telephone Encounter (Signed)
 Pharmacy Patient Advocate Encounter   Received notification from CoverMyMeds that prior authorization for Pantoprazole 40 mg tablets is required/requested.   Insurance verification completed.   The patient is insured through [employer] .   Per test claim: PA required; PA started via CoverMyMeds. KEY [id number] . Waiting for clinical questions to populate.  Calling plan to intiate prior auth getting error message when submitting to cmm stating patient authentication failed. Zipcode does not match.

## 2023-06-03 NOTE — Telephone Encounter (Signed)
 Refills sent to pharmacy.

## 2023-06-07 ENCOUNTER — Telehealth: Payer: Self-pay

## 2023-06-07 ENCOUNTER — Other Ambulatory Visit (HOSPITAL_COMMUNITY): Payer: Self-pay

## 2023-06-07 NOTE — Telephone Encounter (Signed)
 Pharmacy Patient Advocate Encounter   Received notification from CoverMyMeds that prior authorization for Pantoprazole Sodium 40MG  dr tablets is required/requested.   Insurance verification completed.   The patient is insured through [employer] .   Per test claim: PA required; PA submitted to above mentioned insurance via CoverMyMeds Key/confirmation #/EOC [id number] Status is pending

## 2023-06-10 NOTE — Telephone Encounter (Signed)
 Informed patient to continue Pantoprazole twice daily per [place], [place] note on 05/07/23

## 2023-06-10 NOTE — Telephone Encounter (Signed)
 Patient called stated he called last week to ask if he is to continue the Pantoprazole medication, he was not asking for another refill. He stated he was advised on his last visit to stop but he wants to confirm the information. Please advise.

## 2023-06-11 NOTE — Telephone Encounter (Signed)
 Pharmacy Patient Advocate Encounter  Received notification from [employer] that Prior Authorization for Pantoprazole Sodium 40MG  dr tablets has been APPROVED from 06-06-2023 to 12-04-2023   PA #/Case ID/Reference #: [id number]

## 2023-06-29 ENCOUNTER — Other Ambulatory Visit: Payer: Self-pay | Admitting: Family Medicine

## 2023-06-29 DIAGNOSIS — N5201 Erectile dysfunction due to arterial insufficiency: Secondary | ICD-10-CM

## 2023-07-01 ENCOUNTER — Ambulatory Visit: Payer: Managed Care, Other (non HMO) | Admitting: Family Medicine

## 2023-07-01 ENCOUNTER — Encounter: Payer: Self-pay | Admitting: Gastroenterology

## 2023-07-01 ENCOUNTER — Encounter: Payer: Self-pay | Admitting: Family Medicine

## 2023-07-01 VITALS — BP 118/70 | HR 76 | Temp 98.6°F | Ht 60.0 in | Wt 127.6 lb

## 2023-07-01 DIAGNOSIS — E538 Deficiency of other specified B group vitamins: Secondary | ICD-10-CM

## 2023-07-01 DIAGNOSIS — E781 Pure hyperglyceridemia: Secondary | ICD-10-CM

## 2023-07-01 DIAGNOSIS — R7989 Other specified abnormal findings of blood chemistry: Secondary | ICD-10-CM

## 2023-07-01 DIAGNOSIS — F17210 Nicotine dependence, cigarettes, uncomplicated: Secondary | ICD-10-CM

## 2023-07-01 DIAGNOSIS — N5201 Erectile dysfunction due to arterial insufficiency: Secondary | ICD-10-CM | POA: Diagnosis not present

## 2023-07-01 DIAGNOSIS — Z23 Encounter for immunization: Secondary | ICD-10-CM | POA: Diagnosis not present

## 2023-07-01 LAB — CBC WITH DIFFERENTIAL/PLATELET
Basophils Absolute: 0 10*3/uL (ref 0.0–0.1)
Basophils Relative: 0.7 % (ref 0.0–3.0)
Eosinophils Absolute: 0.1 10*3/uL (ref 0.0–0.7)
Eosinophils Relative: 2.7 % (ref 0.0–5.0)
HCT: 45.1 % (ref 39.0–52.0)
Hemoglobin: 15.1 g/dL (ref 13.0–17.0)
Lymphocytes Relative: 36.8 % (ref 12.0–46.0)
Lymphs Abs: 1.8 10*3/uL (ref 0.7–4.0)
MCHC: 33.5 g/dL (ref 30.0–36.0)
MCV: 94.8 fl (ref 78.0–100.0)
Monocytes Absolute: 0.5 10*3/uL (ref 0.1–1.0)
Monocytes Relative: 10.3 % (ref 3.0–12.0)
Neutro Abs: 2.5 10*3/uL (ref 1.4–7.7)
Neutrophils Relative %: 49.5 % (ref 43.0–77.0)
Platelets: 107 10*3/uL — ABNORMAL LOW (ref 150.0–400.0)
RBC: 4.76 Mil/uL (ref 4.22–5.81)
RDW: 13.6 % (ref 11.5–15.5)
WBC: 5 10*3/uL (ref 4.0–10.5)

## 2023-07-01 LAB — COMPREHENSIVE METABOLIC PANEL WITH GFR
ALT: 24 U/L (ref 0–53)
AST: 35 U/L (ref 0–37)
Albumin: 4.6 g/dL (ref 3.5–5.2)
Alkaline Phosphatase: 172 U/L — ABNORMAL HIGH (ref 39–117)
BUN: 17 mg/dL (ref 6–23)
CO2: 27 meq/L (ref 19–32)
Calcium: 9.9 mg/dL (ref 8.4–10.5)
Chloride: 105 meq/L (ref 96–112)
Creatinine, Ser: 0.95 mg/dL (ref 0.40–1.50)
GFR: 86.79 mL/min (ref >60.00)
Glucose, Bld: 77 mg/dL (ref 70–99)
Potassium: 4.2 meq/L (ref 3.5–5.1)
Sodium: 140 meq/L (ref 135–145)
Total Bilirubin: 0.6 mg/dL (ref 0.2–1.2)
Total Protein: 7.9 g/dL (ref 6.0–8.3)

## 2023-07-01 LAB — LIPID PANEL
Cholesterol: 129 mg/dL (ref 0–200)
HDL: 36.4 mg/dL — ABNORMAL LOW (ref >39.00)
LDL Cholesterol: 75 mg/dL (ref 0–99)
NonHDL: 93.04
Total CHOL/HDL Ratio: 4
Triglycerides: 91 mg/dL (ref 0.0–149.0)
VLDL: 18.2 mg/dL (ref 0.0–40.0)

## 2023-07-01 LAB — VITAMIN B12: Vitamin B-12: 424 pg/mL (ref 211–911)

## 2023-07-01 MED ORDER — OMEGA-3-ACID ETHYL ESTERS 1 G PO CAPS: 2.0000 g | ORAL_CAPSULE | Freq: Every day | ORAL | 3 refills | Status: AC

## 2023-07-01 MED ORDER — GEMFIBROZIL 600 MG PO TABS: ORAL_TABLET | ORAL | 3 refills | Status: AC

## 2023-07-01 MED ORDER — SILDENAFIL CITRATE 100 MG PO TABS: 50.0000 mg | ORAL_TABLET | Freq: Every day | ORAL | 6 refills | Status: DC | PRN

## 2023-07-01 NOTE — Progress Notes (Signed)
 Established Patient Office Visit   Subjective:  Patient ID: Gregory Singh, male    DOB: November 11, 1960  Age: 63 y.o. MRN: [id number]  Chief Complaint  Patient presents with   Medical Management of Chronic Issues    6 month follow up. Pt is fasting.     HPI Encounter Diagnoses  Name Primary?   High triglycerides Yes   Erectile dysfunction due to arterial insufficiency    Cigarette smoker    Elevated LFTs    Immunization due    B12 deficiency    For follow-up of the above.  He maintains his sobriety.  Continues to smoke about a third of a pack a day.  Continues to work full-time.  Taking all of his medications.  He is accompanied by his significant other.   Review of Systems  Constitutional: Negative.   HENT: Negative.    Eyes:  Negative for blurred vision, discharge and redness.  Respiratory: Negative.  Negative for shortness of breath.   Cardiovascular: Negative.  Negative for chest pain.  Gastrointestinal:  Negative for abdominal pain.  Genitourinary: Negative.   Musculoskeletal: Negative.  Negative for myalgias.  Skin:  Negative for rash.  Neurological:  Negative for tingling, loss of consciousness and weakness.  Endo/Heme/Allergies:  Negative for polydipsia.     Current Outpatient Medications:    b complex vitamins capsule, Take 1 capsule by mouth daily., Disp: , Rfl:    clobetasol ointment (TEMOVATE) 0.05 %, Apply bid to rash on leg liberally, do not use on face or genitals, Disp: 60 g, Rfl: 1   folic acid (FOLVITE) 1 MG tablet, Take 1 tablet (1 mg total) by mouth daily., Disp: 90 tablet, Rfl: 3   Multiple Vitamins-Minerals (MULTIVITAMIN WITH IRON-MINERALS) liquid, Take by mouth daily., Disp: , Rfl:    pantoprazole (PROTONIX) 40 MG tablet, Take 1 tablet (40 mg total) by mouth 2 (two) times daily before a meal., Disp: 180 tablet, Rfl: 3   tenofovir (VIREAD) 300 MG tablet, Take 1 tablet (300 mg total) by mouth daily., Disp: 30 tablet, Rfl: 11   ferrous sulfate 325 (65 FE) MG  EC tablet, Take 1 tablet (325 mg total) by mouth 2 (two) times daily., Disp: 60 tablet, Rfl: 0   gemfibrozil (LOPID) 600 MG tablet, TAKE 1 TABLET(600 MG) BY MOUTH TWICE DAILY, Disp: 180 tablet, Rfl: 3   omega-3 acid ethyl esters (LOVAZA) 1 g capsule, Take 2 capsules (2 g total) by mouth daily., Disp: 180 capsule, Rfl: 3   sildenafil (VIAGRA) 100 MG tablet, Take 0.5-1 tablets (50-100 mg total) by mouth daily as needed for erectile dysfunction., Disp: 20 tablet, Rfl: 6   Objective:     BP 118/70 (BP Location: Right Arm, Patient Position: Sitting, Cuff Size: Normal)   Pulse 76   Temp 98.6 F (37 C) (Temporal)   Ht 5' (1.524 m)   Wt 127 lb 9.6 oz (57.9 kg)   SpO2 97%   BMI 24.92 kg/m    Physical Exam Constitutional:      General: He is not in acute distress.    Appearance: Normal appearance. He is not ill-appearing, toxic-appearing or diaphoretic.  HENT:     Head: Normocephalic and atraumatic.     Right Ear: External ear normal.     Left Ear: External ear normal.  Eyes:     General: No scleral icterus.       Right eye: No discharge.        Left eye: No discharge.  Extraocular Movements: Extraocular movements intact.     Conjunctiva/sclera: Conjunctivae normal.  Cardiovascular:     Rate and Rhythm: Normal rate and regular rhythm.  Pulmonary:     Effort: Pulmonary effort is normal. No respiratory distress.     Breath sounds: Normal breath sounds. No wheezing, rhonchi or rales.  Musculoskeletal:     Cervical back: No rigidity or tenderness.  Skin:    General: Skin is warm and dry.  Neurological:     Mental Status: He is alert and oriented to person, place, and time.  Psychiatric:        Mood and Affect: Mood normal.        Behavior: Behavior normal.      No results found for any visits on 07/01/23.    The 10-year ASCVD risk score (Arnett DK, et al., 2019) is: 9.6%    Assessment & Plan:   High triglycerides -     CBC with Differential/Platelet -      Comprehensive metabolic panel with GFR -     Lipid panel -     Omega-3-acid Ethyl Esters; Take 2 capsules (2 g total) by mouth daily.  Dispense: 180 capsule; Refill: 3 -     Gemfibrozil; TAKE 1 TABLET(600 MG) BY MOUTH TWICE DAILY  Dispense: 180 tablet; Refill: 3  Erectile dysfunction due to arterial insufficiency -     Sildenafil Citrate; Take 0.5-1 tablets (50-100 mg total) by mouth daily as needed for erectile dysfunction.  Dispense: 20 tablet; Refill: 6  Cigarette smoker  Elevated LFTs -     Comprehensive metabolic panel with GFR  Immunization due -     Pneumococcal conjugate vaccine 20-valent  B12 deficiency -     Vitamin B12    Return in about 6 months (around 12/31/2023).  Congratulated him on maintaining his sobriety.  Encouraged him to continue to wean himself off of tobacco.  Prevnar shot today.  Tonna Frederic, MD

## 2023-07-09 ENCOUNTER — Encounter: Payer: Commercial Managed Care - HMO | Admitting: Gastroenterology

## 2023-08-08 ENCOUNTER — Ambulatory Visit (INDEPENDENT_AMBULATORY_CARE_PROVIDER_SITE_OTHER): Payer: Commercial Managed Care - HMO | Admitting: Internal Medicine

## 2023-08-08 ENCOUNTER — Other Ambulatory Visit: Payer: Self-pay

## 2023-08-08 ENCOUNTER — Encounter: Payer: Self-pay | Admitting: Internal Medicine

## 2023-08-08 VITALS — BP 154/79 | HR 59 | Temp 97.5°F | Ht 66.0 in | Wt 126.0 lb

## 2023-08-08 DIAGNOSIS — Z79899 Other long term (current) drug therapy: Secondary | ICD-10-CM

## 2023-08-08 DIAGNOSIS — B181 Chronic viral hepatitis B without delta-agent: Secondary | ICD-10-CM | POA: Diagnosis not present

## 2023-08-08 MED ORDER — TENOFOVIR DISOPROXIL FUMARATE 300 MG PO TABS: 300.0000 mg | ORAL_TABLET | Freq: Every day | ORAL | 11 refills | Status: AC

## 2023-08-08 NOTE — Progress Notes (Signed)
 [name]: follow up for chronic hep b Patient ID: Gregory Singh, male   DOB: [birth year], 63 y.o.   MRN: [id number]  HPI 63yo M with history of Chronic hepatitis c on tenofovir  who reports doing well. He still remains abstaining from ETOH use, but back to smoking. Overall feels that he is good health. Has not had further health issues.  Outpatient Encounter Medications as of 08/08/2023  Medication Sig   b complex vitamins capsule Take 1 capsule by mouth daily.   clobetasol  ointment (TEMOVATE ) 0.05 % Apply bid to rash on leg liberally, do not use on face or genitals   ferrous sulfate  325 (65 FE) MG EC tablet Take 1 tablet (325 mg total) by mouth 2 (two) times daily.   folic acid  (FOLVITE ) 1 MG tablet Take 1 tablet (1 mg total) by mouth daily.   gemfibrozil  (LOPID ) 600 MG tablet TAKE 1 TABLET(600 MG) BY MOUTH TWICE DAILY   Multiple Vitamins-Minerals (MULTIVITAMIN WITH IRON-MINERALS) liquid Take by mouth daily.   omega-3 acid ethyl esters (LOVAZA ) 1 g capsule Take 2 capsules (2 g total) by mouth daily.   pantoprazole  (PROTONIX ) 40 MG tablet Take 1 tablet (40 mg total) by mouth 2 (two) times daily before a meal.   sildenafil  (VIAGRA ) 100 MG tablet Take 0.5-1 tablets (50-100 mg total) by mouth daily as needed for erectile dysfunction.   tenofovir  (VIREAD ) 300 MG tablet Take 1 tablet (300 mg total) by mouth daily.   No facility-administered encounter medications on file as of 08/08/2023.     Patient Active Problem List   Diagnosis Date Noted   History of colonic polyps 05/07/2023   Folic acid  deficiency 04/09/2023   Visit for suture removal 04/09/2023   Hospital discharge follow-up 04/09/2023   Upper GI bleed 03/28/2023   Hematemesis 03/28/2023   Syncope, vasovagal 03/28/2023   Acute blood loss anemia 03/28/2023   Elevated troponin 03/28/2023   Alcohol abuse 03/28/2023   Cirrhosis (HCC) 03/28/2023   COPD  GOLD 0/ active smoker 11/08/2020   Cigarette smoker 11/08/2020   Hepatitis B 01/19/2015    Thrombocytopenia (HCC) 01/10/2015   Tremor of both hands 01/10/2015   High triglycerides 01/10/2015   Neck pain 05/21/2011   Shoulder pain 05/21/2011   Neck pain on right side 05/15/2011     There are no preventive care reminders to display for this patient.   Review of Systems Review of Systems  Constitutional: Negative for fever, chills, diaphoresis, activity change, appetite change, fatigue and unexpected weight change.  HENT: Negative for congestion, sore throat, rhinorrhea, sneezing, trouble swallowing and sinus pressure.  Eyes: Negative for photophobia and visual disturbance.  Respiratory: Negative for cough, chest tightness, shortness of breath, wheezing and stridor.  Cardiovascular: Negative for chest pain, palpitations and leg swelling.  Gastrointestinal: Negative for nausea, vomiting, abdominal pain, diarrhea, constipation, blood in stool, abdominal distention and anal bleeding.  Genitourinary: Negative for dysuria, hematuria, flank pain and difficulty urinating.  Musculoskeletal: Negative for myalgias, back pain, joint swelling, arthralgias and gait problem.  Skin: Negative for color change, pallor, rash and wound.  Neurological: Negative for dizziness, tremors, weakness and light-headedness.  Hematological: Negative for adenopathy. Does not bruise/bleed easily.  Psychiatric/Behavioral: Negative for behavioral problems, confusion, sleep disturbance, dysphoric mood, decreased concentration and agitation.   Physical Exam   Ht 5\' 6"  (1.676 m)   Wt 126 lb (57.2 kg)   BMI 20.34 kg/m    Physical Exam  Constitutional: He is oriented to person, place, and time. He  appears well-developed and well-nourished. No distress.  HENT:  Mouth/Throat: Oropharynx is clear and moist. No oropharyngeal exudate.  Cardiovascular: Normal rate, regular rhythm and normal heart sounds. Exam reveals no gallop and no friction rub.  No murmur heard.  Pulmonary/Chest: Effort normal and breath sounds  normal. No respiratory distress. He has no wheezes.  Abdominal: Soft. Bowel sounds are normal. He exhibits no distension. There is no tenderness.  Lymphadenopathy:  He has no cervical adenopathy.  Neurological: He is alert and oriented to person, place, and time.  Skin: Skin is warm and dry. No rash noted. No erythema.  Psychiatric: He has a normal mood and affect. His behavior is normal.   Lab Results  Component Value Date   HEPBSAB NON-REACTIVE 10/02/2022   No results found for: "RPR", "LABRPR"  CBC Lab Results  Component Value Date   WBC 5.0 07/01/2023   RBC 4.76 07/01/2023   HGB 15.1 07/01/2023   HCT 45.1 07/01/2023   PLT 107.0 (L) 07/01/2023   MCV 94.8 07/01/2023   MCH 36.0 (H) 03/30/2023   MCHC 33.5 07/01/2023   RDW 13.6 07/01/2023   LYMPHSABS 1.8 07/01/2023   MONOABS 0.5 07/01/2023   EOSABS 0.1 07/01/2023    BMET Lab Results  Component Value Date   NA 140 07/01/2023   K 4.2 07/01/2023   CL 105 07/01/2023   CO2 27 07/01/2023   GLUCOSE 77 07/01/2023   BUN 17 07/01/2023   CREATININE 0.95 07/01/2023   CALCIUM  9.9 07/01/2023   GFRNONAA >60 03/29/2023   GFRAA 115 11/26/2018      Assessment and Plan Chronic hepatitis b = will continue with tenofovir . Will get labs to check hep b surface ab, and hep B VL  Getting limited abd u/s for hcc surveillance Continued to encourage his strong work with stopping etoh use. Refill tenofovir   Rtc in 6 months  I have personally spent 30 minutes involved in face-to-face and non-face-to-face activities for this patient on the day of the visit. Professional time spent includes the following activities: Preparing to see the patient (review of tests), Obtaining and/or reviewing separately obtained history (admission/discharge record), Performing a medically appropriate examination and/or evaluation , Ordering medications/tests/procedures, referring and communicating with other health care professionals, Documenting clinical  information in the EMR, Independently interpreting results (not separately reported), Communicating results to the patient and wife.

## 2023-08-11 LAB — HEPATITIS B DNA, ULTRAQUANTITATIVE, PCR
Hepatitis B DNA: NOT DETECTED [IU]/mL
Hepatitis B virus DNA: NOT DETECTED {Log_IU}/mL

## 2023-08-11 LAB — COMPREHENSIVE METABOLIC PANEL WITH GFR
AG Ratio: 1.4 (calc) (ref 1.0–2.5)
ALT: 25 U/L (ref 9–46)
AST: 29 U/L (ref 10–35)
Albumin: 4.7 g/dL (ref 3.6–5.1)
Alkaline phosphatase (APISO): 165 U/L — ABNORMAL HIGH (ref 35–144)
BUN: 18 mg/dL (ref 7–25)
CO2: 29 mmol/L (ref 20–32)
Calcium: 10 mg/dL (ref 8.6–10.3)
Chloride: 105 mmol/L (ref 98–110)
Creat: 0.91 mg/dL (ref 0.70–1.35)
Globulin: 3.4 g/dL (ref 1.9–3.7)
Glucose, Bld: 89 mg/dL (ref 65–99)
Potassium: 4.4 mmol/L (ref 3.5–5.3)
Sodium: 141 mmol/L (ref 135–146)
Total Bilirubin: 0.6 mg/dL (ref 0.2–1.2)
Total Protein: 8.1 g/dL (ref 6.1–8.1)
eGFR: 96 mL/min/{1.73_m2} (ref >=60)

## 2023-08-11 LAB — HEPATITIS B SURFACE ANTIBODY,QUALITATIVE: Hep B S Ab: NONREACTIVE

## 2023-08-16 ENCOUNTER — Ambulatory Visit (HOSPITAL_COMMUNITY)
Admission: RE | Admit: 2023-08-16 | Discharge: 2023-08-16 | Disposition: A | Discharge status: Home / Self Care | Source: Ambulatory Visit | Attending: Internal Medicine | Admitting: Internal Medicine

## 2023-08-16 DIAGNOSIS — B181 Chronic viral hepatitis B without delta-agent: Secondary | ICD-10-CM | POA: Diagnosis present

## 2023-08-30 ENCOUNTER — Ambulatory Visit (AMBULATORY_SURGERY_CENTER): Admitting: Gastroenterology

## 2023-08-30 ENCOUNTER — Encounter: Payer: Self-pay | Admitting: Gastroenterology

## 2023-08-30 VITALS — BP 115/75 | HR 68 | Temp 97.8°F | Resp 15 | Ht 60.0 in | Wt 135.0 lb

## 2023-08-30 DIAGNOSIS — D120 Benign neoplasm of cecum: Secondary | ICD-10-CM | POA: Diagnosis not present

## 2023-08-30 DIAGNOSIS — K5730 Diverticulosis of large intestine without perforation or abscess without bleeding: Secondary | ICD-10-CM | POA: Diagnosis not present

## 2023-08-30 DIAGNOSIS — Z1211 Encounter for screening for malignant neoplasm of colon: Secondary | ICD-10-CM | POA: Diagnosis present

## 2023-08-30 DIAGNOSIS — Z860100 Personal history of colon polyps, unspecified: Secondary | ICD-10-CM

## 2023-08-30 DIAGNOSIS — K648 Other hemorrhoids: Secondary | ICD-10-CM

## 2023-08-30 DIAGNOSIS — K922 Gastrointestinal hemorrhage, unspecified: Secondary | ICD-10-CM

## 2023-08-30 MED ORDER — SODIUM CHLORIDE 0.9 % IV SOLN: 500.0000 mL | INTRAVENOUS | Status: DC

## 2023-08-30 NOTE — Progress Notes (Signed)
 Called to room to assist during endoscopic procedure.  Patient ID and intended procedure confirmed with present staff. Received instructions for my participation in the procedure from the performing physician.

## 2023-08-30 NOTE — Progress Notes (Signed)
 Sedate, gd SR, tolerated procedure well, VSS, report to RN

## 2023-08-30 NOTE — Progress Notes (Signed)
 [facility] History and Physical   Primary Care Physician:  Tonna Frederic, MD   Reason for Procedure:   H/O polyps  Plan: Colon       HPI: Gregory Singh is a 63 y.o. male   with history of polyps- 3 prior colonoscopies at [facility]- ?[facility]  last colonoscopy 2 years ago per patient had 3 polyps  being followed by Dr.Nunez for hepB Prev attempted colon 10/2022- poor prep  Pt again ate yesterday Past Medical History:  Diagnosis Date   Hypertension     Past Surgical History:  Procedure Laterality Date   BIOPSY  03/29/2023   Procedure: BIOPSY;  Surgeon: Sergio Dandy, MD;  Location: [facility] ENDOSCOPY;  Service: Gastroenterology;;   ESOPHAGOGASTRODUODENOSCOPY (EGD) WITH PROPOFOL  N/A 03/29/2023   Procedure: ESOPHAGOGASTRODUODENOSCOPY (EGD) WITH PROPOFOL ;  Surgeon: Sergio Dandy, MD;  Location: [facility] ENDOSCOPY;  Service: Gastroenterology;  Laterality: N/A;    Prior to Admission medications   Medication Sig Start Date End Date Taking? Authorizing Provider  folic acid  (FOLVITE ) 1 MG tablet Take 1 tablet (1 mg total) by mouth daily. 05/07/23  Yes Zehr, Jessica D, PA-C  Multiple Vitamins-Minerals (MULTIVITAMIN WITH IRON-MINERALS) liquid Take by mouth daily.   Yes [provider]  omega-3 acid ethyl esters (LOVAZA ) 1 g capsule Take 2 capsules (2 g total) by mouth daily. 07/01/23  Yes Tonna Frederic, MD  pantoprazole  (PROTONIX ) 40 MG tablet Take 1 tablet (40 mg total) by mouth 2 (two) times daily before a meal. 06/03/23 05/28/24 Yes Zehr, Jessica D, PA-C  tenofovir  (VIREAD ) 300 MG tablet Take 1 tablet (300 mg total) by mouth daily. 08/08/23  Yes Liane Redman, MD  triamcinolone  cream (KENALOG ) 0.5 % Apply topically. 02/29/20  Yes [provider]  b complex vitamins capsule Take 1 capsule by mouth daily.    [provider]  clobetasol  ointment (TEMOVATE ) 0.05 % Apply bid to rash on leg liberally, do not use on face or genitals 12/27/21   Tonna Frederic, MD  ferrous sulfate  325 (65 FE) MG EC tablet Take 1 tablet (325 mg total) by mouth 2 (two) times daily. 04/09/23 08/08/23  Tonna Frederic, MD  gemfibrozil  (LOPID ) 600 MG tablet TAKE 1 TABLET(600 MG) BY MOUTH TWICE DAILY Patient not taking: Reported on 08/30/2023 07/01/23   Tonna Frederic, MD  sildenafil  (VIAGRA ) 100 MG tablet Take 0.5-1 tablets (50-100 mg total) by mouth daily as needed for erectile dysfunction. 07/01/23   Tonna Frederic, MD    Current Outpatient Medications  Medication Sig Dispense Refill   folic acid  (FOLVITE ) 1 MG tablet Take 1 tablet (1 mg total) by mouth daily. 90 tablet 3   Multiple Vitamins-Minerals (MULTIVITAMIN WITH IRON-MINERALS) liquid Take by mouth daily.     omega-3 acid ethyl esters (LOVAZA ) 1 g capsule Take 2 capsules (2 g total) by mouth daily. 180 capsule 3   pantoprazole  (PROTONIX ) 40 MG tablet Take 1 tablet (40 mg total) by mouth 2 (two) times daily before a meal. 180 tablet 3   tenofovir  (VIREAD ) 300 MG tablet Take 1 tablet (300 mg total) by mouth daily. 30 tablet 11   triamcinolone  cream (KENALOG ) 0.5 % Apply topically.     b complex vitamins capsule Take 1 capsule by mouth daily.     clobetasol  ointment (TEMOVATE ) 0.05 % Apply bid to rash on leg liberally, do not use on face or genitals 60 g 1   ferrous sulfate  325 (65 FE) MG EC tablet Take 1  tablet (325 mg total) by mouth 2 (two) times daily. 60 tablet 0   gemfibrozil  (LOPID ) 600 MG tablet TAKE 1 TABLET(600 MG) BY MOUTH TWICE DAILY (Patient not taking: Reported on 08/30/2023) 180 tablet 3   sildenafil  (VIAGRA ) 100 MG tablet Take 0.5-1 tablets (50-100 mg total) by mouth daily as needed for erectile dysfunction. 20 tablet 6   Current Facility-Administered Medications  Medication Dose Route Frequency Provider Last Rate Last Admin   0.9 %  sodium chloride  infusion  500 mL Intravenous Continuous Lajuan Pila, MD        Allergies as of 08/30/2023   (No Known Allergies)     Family History  Problem Relation Age of Onset   Healthy Mother    Diabetes Father    Stroke Father    Colon cancer Neg Hx    Colon polyps Neg Hx    Esophageal cancer Neg Hx    Rectal cancer Neg Hx    Stomach cancer Neg Hx     Social History   Socioeconomic History   Marital status: Married    Spouse name: Not on file   Number of children: Not on file   Years of education: Not on file   Highest education level: Not on file  Occupational History   Not on file  Tobacco Use   Smoking status: Every Day    Current packs/day: 0.25    Average packs/day: 0.3 packs/day for 43.4 years (10.9 ttl pk-yrs)    Types: Cigarettes    Start date: 03/19/1980   Smokeless tobacco: Never   Tobacco comments:    Patient reports 6 cigs a day.  Vaping Use   Vaping status: Never Used  Substance and Sexual Activity   Alcohol use: Not Currently    Alcohol/week: 24.0 standard drinks of alcohol    Types: 24 Standard drinks or equivalent per week   Drug use: No   Sexual activity: Yes  Other Topics Concern   Not on file  Social History Narrative   Not on file   Social Drivers of Health   Financial Resource Strain: Not on file  Food Insecurity: No Food Insecurity (03/29/2023)   Hunger Vital Sign    Worried About Running Out of Food in the Last Year: Never true    Ran Out of Food in the Last Year: Never true  Transportation Needs: No Transportation Needs (03/29/2023)   PRAPARE - Administrator, Civil Service (Medical): No    Lack of Transportation (Non-Medical): No  Physical Activity: Not on file  Stress: Not on file  Social Connections: Not on file  Intimate Partner Violence: Not At Risk (03/29/2023)   Humiliation, Afraid, Rape, and Kick questionnaire    Fear of Current or Ex-Partner: No    Emotionally Abused: No    Physically Abused: No    Sexually Abused: No    Review of Systems: Positive for none All other review of systems negative except as mentioned in the  HPI.  Physical Exam: Vital signs in last 24 hours: @VSRANGES @   General:   Alert,  Well-developed, well-nourished, pleasant and cooperative in NAD Lungs:  Clear throughout to auscultation.   Heart:  Regular rate and rhythm; no murmurs, clicks, rubs,  or gallops. Abdomen:  Soft, nontender and nondistended. Normal bowel sounds.   Neuro/Psych:  Alert and cooperative. Normal mood and affect. A and O x 3    No significant changes were identified.  The patient continues to be an appropriate candidate  for the planned procedure and anesthesia.   Magnus Schuller, MD. [facility] 08/30/2023 8:42 AM@

## 2023-08-30 NOTE — Patient Instructions (Signed)
 Resume previous diet and medications. Awaiting pathology results. Repeat Colonoscopy date to be determined based on pathology results. Handouts provided on Colon polyps, Diverticulosis and Hemorrhoids   YOU HAD AN ENDOSCOPIC PROCEDURE TODAY AT THE [facility]:   Refer to the procedure report that was given to you for any specific questions about what was found during the examination.  If the procedure report does not answer your questions, please call your gastroenterologist to clarify.  If you requested that your care partner not be given the details of your procedure findings, then the procedure report has been included in a sealed envelope for you to review at your convenience later.  YOU SHOULD EXPECT: Some feelings of bloating in the abdomen. Passage of more gas than usual.  Walking can help get rid of the air that was put into your GI tract during the procedure and reduce the bloating. If you had a lower endoscopy (such as a colonoscopy or flexible sigmoidoscopy) you may notice spotting of blood in your stool or on the toilet paper. If you underwent a bowel prep for your procedure, you may not have a normal bowel movement for a few days.  Please Note:  You might notice some irritation and congestion in your nose or some drainage.  This is from the oxygen used during your procedure.  There is no need for concern and it should clear up in a day or so.  SYMPTOMS TO REPORT IMMEDIATELY:  Following lower endoscopy (colonoscopy or flexible sigmoidoscopy):  Excessive amounts of blood in the stool  Significant tenderness or worsening of abdominal pains  Swelling of the abdomen that is new, acute  Fever of 100F or higher  For urgent or emergent issues, a gastroenterologist can be reached at any hour by calling [phone]. Do not use MyChart messaging for urgent concerns.    DIET:  We do recommend a small meal at first, but then you may proceed to your regular diet.  Drink plenty of  fluids but you should avoid alcoholic beverages for 24 hours.  ACTIVITY:  You should plan to take it easy for the rest of today and you should NOT DRIVE or use heavy machinery until tomorrow (because of the sedation medicines used during the test).    FOLLOW UP: Our staff will call the number listed on your records the next business day following your procedure.  We will call around 7:15- 8:00 am to check on you and address any questions or concerns that you may have regarding the information given to you following your procedure. If we do not reach you, we will leave a message.     If any biopsies were taken you will be contacted by phone or by letter within the next 1-3 weeks.  Please call us at [phone] if you have not heard about the biopsies in 3 weeks.    SIGNATURES/CONFIDENTIALITY: You and/or your care partner have signed paperwork which will be entered into your electronic medical record.  These signatures attest to the fact that that the information above on your After Visit Summary has been reviewed and is understood.  Full responsibility of the confidentiality of this discharge information lies with you and/or your care-partner.

## 2023-08-30 NOTE — Op Note (Signed)
 [facility] Patient Name: Gregory Singh Procedure Date: 08/30/2023 8:40 AM MRN: [id number] Endoscopist: Lajuan Pila , MD, [id number] Age: 63 Referring MD:  Date of Birth: 12-30-1960 Gender: Male Account #: [id number] Procedure:                Colonoscopy Indications:              High risk colon cancer surveillance: Personal                            history of colonic polyps at [facility]. Medicines:                Monitored Anesthesia Care Procedure:                Pre-Anesthesia Assessment:                           - Prior to the procedure, a History and Physical                            was performed, and patient medications and                            allergies were reviewed. The patient's tolerance of                            previous anesthesia was also reviewed. The risks                            and benefits of the procedure and the sedation                            options and risks were discussed with the patient.                            All questions were answered, and informed consent                            was obtained. Prior Anticoagulants: The patient has                            taken no anticoagulant or antiplatelet agents. ASA                            Grade Assessment: II - A patient with mild systemic                            disease. After reviewing the risks and benefits,                            the patient was deemed in satisfactory condition to                            undergo the procedure.  After obtaining informed consent, the colonoscope                            was passed under direct vision. Throughout the                            procedure, the patient's blood pressure, pulse, and                            oxygen saturations were monitored continuously. The                            Olympus Scope SN: [id number] was introduced through                            the anus and  advanced to the the cecum, identified                            by appendiceal orifice and ileocecal valve. The                            colonoscopy was performed without difficulty. The                            patient tolerated the procedure well. The quality                            of the bowel preparation was adequate to identify                            polyps. Solid vegetable material in several areas                            of the colon which will clog the suction channel of                            the scope. Nevertheless, aggressive suctioning and                            aspiration was performed. Overall over 90 to 95% of                            the colonic mucosa is visualized satisfactorily.                            Small and flat lesions could have been missed. The                            ileocecal valve, appendiceal orifice, and rectum                            were photographed. Scope In: 8:47:32 AM Scope Out: 9:02:50 AM Scope Withdrawal Time: 0 hours 12 minutes  11 seconds  Total Procedure Duration: 0 hours 15 minutes 18 seconds  Findings:                 A 6 mm polyp was found in the cecum. The polyp was                            sessile. The polyp was removed with a cold snare.                            Resection and retrieval were complete.                           A tattoo was seen in the mid transverse colon. The                            tattoo site appeared normal. No residual polyps                           A few small-mouthed diverticula were found in the                            sigmoid colon.                           Non-bleeding internal hemorrhoids were found during                            retroflexion. The hemorrhoids were small.                           The exam was otherwise without abnormality on                            direct and retroflexion views. Complications:            No immediate complications. Estimated  Blood Loss:     Estimated blood loss: none. Impression:               - One 6 mm polyp in the cecum, removed with a cold                            snare. Resected and retrieved.                           - A tattoo was seen in the mid transverse colon.                            The tattoo site appeared normal.                           - Mild sigmoid diverticulosis.                           - Non-bleeding internal hemorrhoids.                           -  The examination was otherwise normal on direct                            and retroflexion views. Recommendation:           - Patient has a contact number available for                            emergencies. The signs and symptoms of potential                            delayed complications were discussed with the                            patient. Return to normal activities tomorrow.                            Written discharge instructions were provided to the                            patient.                           - Resume previous diet.                           - Continue present medications.                           - Await pathology results.                           - Repeat colonoscopy for surveillance based on                            pathology results.                           - The findings and recommendations were discussed                            with the patient's family. Lajuan Pila, MD 08/30/2023 9:15:45 AM This report has been signed electronically.

## 2023-09-02 ENCOUNTER — Telehealth: Payer: Self-pay | Admitting: *Deleted

## 2023-09-02 NOTE — Telephone Encounter (Signed)
 Attempted post procedure follow up call.  No answer - unable to LVM.

## 2023-09-03 LAB — SURGICAL PATHOLOGY

## 2023-09-06 ENCOUNTER — Ambulatory Visit: Payer: Self-pay | Admitting: Gastroenterology

## 2023-12-03 ENCOUNTER — Other Ambulatory Visit (HOSPITAL_COMMUNITY): Payer: Self-pay

## 2023-12-05 ENCOUNTER — Other Ambulatory Visit (HOSPITAL_COMMUNITY): Payer: Self-pay

## 2023-12-06 ENCOUNTER — Other Ambulatory Visit (HOSPITAL_COMMUNITY): Payer: Self-pay

## 2023-12-06 ENCOUNTER — Telehealth: Payer: Self-pay

## 2023-12-06 NOTE — Telephone Encounter (Signed)
 Pharmacy Patient Advocate Encounter   Received notification from CoverMyMeds that prior authorization for Pantoprazole  Sodium 40MG  dr tablets is required/requested.   Insurance verification completed.   The patient is insured through [employer] .   Per test claim: PA required; PA submitted to above mentioned insurance via Latent Key/confirmation #/EOC [id number] Status is pending

## 2023-12-09 ENCOUNTER — Other Ambulatory Visit (HOSPITAL_COMMUNITY): Payer: Self-pay

## 2023-12-09 NOTE — Telephone Encounter (Signed)
 Pharmacy Patient Advocate Encounter  Received notification from [employer]  that Prior Authorization for Pantoprazole  Sodium 40MG  dr tablets has been APPROVED from 12-06-2023 to 12-06-2024. Ran test claim, Copay is $32.98**. This test claim was processed through [facility]- copay amounts may vary at other pharmacies due to pharmacy/plan contracts, or as the patient moves through the different stages of their insurance plan.   PA #/Case ID/Reference #: [id number]  **$32.98 co-pay for 90 day supply, quantity 180 tablets $10.99 co-pay for 30 day supply, quantity 60 tablets

## 2023-12-09 NOTE — Telephone Encounter (Signed)
 Noted the pt has been advised

## 2024-01-02 ENCOUNTER — Encounter: Payer: Self-pay | Admitting: Family Medicine

## 2024-01-02 ENCOUNTER — Ambulatory Visit: Payer: Self-pay | Admitting: Family Medicine

## 2024-01-02 ENCOUNTER — Ambulatory Visit (INDEPENDENT_AMBULATORY_CARE_PROVIDER_SITE_OTHER): Admitting: Family Medicine

## 2024-01-02 VITALS — BP 122/72 | HR 64 | Temp 97.6°F | Ht 60.0 in | Wt 126.4 lb

## 2024-01-02 DIAGNOSIS — Z0000 Encounter for general adult medical examination without abnormal findings: Secondary | ICD-10-CM

## 2024-01-02 DIAGNOSIS — F17210 Nicotine dependence, cigarettes, uncomplicated: Secondary | ICD-10-CM | POA: Diagnosis not present

## 2024-01-02 DIAGNOSIS — Z23 Encounter for immunization: Secondary | ICD-10-CM | POA: Diagnosis not present

## 2024-01-02 DIAGNOSIS — Z125 Encounter for screening for malignant neoplasm of prostate: Secondary | ICD-10-CM

## 2024-01-02 DIAGNOSIS — E781 Pure hyperglyceridemia: Secondary | ICD-10-CM

## 2024-01-02 LAB — URINALYSIS, ROUTINE W REFLEX MICROSCOPIC
Bilirubin Urine: NEGATIVE
Hgb urine dipstick: NEGATIVE
Ketones, ur: NEGATIVE
Leukocytes,Ua: NEGATIVE
Nitrite: NEGATIVE
RBC / HPF: NONE SEEN (ref 0–?)
Specific Gravity, Urine: 1.025 (ref 1.000–1.030)
Total Protein, Urine: NEGATIVE
Urine Glucose: NEGATIVE
Urobilinogen, UA: 0.2 (ref 0.0–1.0)
WBC, UA: NONE SEEN (ref 0–?)
pH: 6 (ref 5.0–8.0)

## 2024-01-02 LAB — COMPREHENSIVE METABOLIC PANEL WITH GFR
ALT: 28 U/L (ref 0–53)
AST: 34 U/L (ref 0–37)
Albumin: 4.7 g/dL (ref 3.5–5.2)
Alkaline Phosphatase: 171 U/L — ABNORMAL HIGH (ref 39–117)
BUN: 16 mg/dL (ref 6–23)
CO2: 26 meq/L (ref 19–32)
Calcium: 9.7 mg/dL (ref 8.4–10.5)
Chloride: 106 meq/L (ref 96–112)
Creatinine, Ser: 0.9 mg/dL (ref 0.40–1.50)
GFR: 92.28 mL/min (ref >60.00)
Glucose, Bld: 94 mg/dL (ref 70–99)
Potassium: 3.8 meq/L (ref 3.5–5.1)
Sodium: 143 meq/L (ref 135–145)
Total Bilirubin: 0.6 mg/dL (ref 0.2–1.2)
Total Protein: 7.5 g/dL (ref 6.0–8.3)

## 2024-01-02 LAB — CBC
HCT: 45.2 % (ref 39.0–52.0)
Hemoglobin: 15.1 g/dL (ref 13.0–17.0)
MCHC: 33.5 g/dL (ref 30.0–36.0)
MCV: 97 fl (ref 78.0–100.0)
Platelets: 86 K/uL — ABNORMAL LOW (ref 150.0–400.0)
RBC: 4.66 Mil/uL (ref 4.22–5.81)
RDW: 15.4 % (ref 11.5–15.5)
WBC: 5 K/uL (ref 4.0–10.5)

## 2024-01-02 LAB — LIPID PANEL
Cholesterol: 151 mg/dL (ref 0–200)
HDL: 41.4 mg/dL (ref >39.00)
LDL Cholesterol: 95 mg/dL (ref 0–99)
NonHDL: 109.7
Total CHOL/HDL Ratio: 4
Triglycerides: 75 mg/dL (ref 0.0–149.0)
VLDL: 15 mg/dL (ref 0.0–40.0)

## 2024-01-02 LAB — PSA: PSA: 0.71 ng/mL (ref 0.10–4.00)

## 2024-01-02 NOTE — Progress Notes (Signed)
 Established Patient Office Visit   Subjective:  Patient ID: Gregory Singh, male    DOB: May 27, 1960  Age: 63 y.o. MRN: [id number]  Chief Complaint  Patient presents with   Medical Management of Chronic Issues    6 month follow up. Pt is fasting. Flu shot today.     HPI Encounter Diagnoses  Name Primary?   Healthcare maintenance Yes   High triglycerides    Cigarette smoker    Immunization due    Screening for prostate cancer    For follow-up of above.  Doing okay.  Continues follow-up at the hepatology clinic.  Has been sober for 9 months now.  Continues to smoke 6 or 7 cigarettes daily.  Has regular dental care.  He is active on his job.  He is accompanied by his wife Luke.  Denies issues with urine flow or defecation.  Has regular screening with a low-dose CT of his chest.  Has no problems with hearing or pain in his right ear.   Review of Systems  Constitutional: Negative.   HENT: Negative.    Eyes:  Negative for blurred vision, discharge and redness.  Respiratory: Negative.    Cardiovascular: Negative.   Gastrointestinal:  Negative for abdominal pain.  Genitourinary: Negative.   Musculoskeletal: Negative.  Negative for myalgias.  Skin:  Negative for rash.  Neurological:  Negative for tingling, loss of consciousness and weakness.  Endo/Heme/Allergies:  Negative for polydipsia.     Current Outpatient Medications:    b complex vitamins capsule, Take 1 capsule by mouth daily., Disp: , Rfl:    clobetasol  ointment (TEMOVATE ) 0.05 %, Apply bid to rash on leg liberally, do not use on face or genitals, Disp: 60 g, Rfl: 1   ferrous sulfate  325 (65 FE) MG EC tablet, Take 1 tablet (325 mg total) by mouth 2 (two) times daily., Disp: 60 tablet, Rfl: 0   folic acid  (FOLVITE ) 1 MG tablet, Take 1 tablet (1 mg total) by mouth daily., Disp: 90 tablet, Rfl: 3   Multiple Vitamins-Minerals (MULTIVITAMIN WITH IRON-MINERALS) liquid, Take by mouth daily., Disp: , Rfl:    omega-3 acid ethyl esters  (LOVAZA ) 1 g capsule, Take 2 capsules (2 g total) by mouth daily., Disp: 180 capsule, Rfl: 3   pantoprazole  (PROTONIX ) 40 MG tablet, Take 1 tablet (40 mg total) by mouth 2 (two) times daily before a meal., Disp: 180 tablet, Rfl: 3   sildenafil  (VIAGRA ) 100 MG tablet, Take 0.5-1 tablets (50-100 mg total) by mouth daily as needed for erectile dysfunction., Disp: 20 tablet, Rfl: 6   tenofovir  (VIREAD ) 300 MG tablet, Take 1 tablet (300 mg total) by mouth daily., Disp: 30 tablet, Rfl: 11   triamcinolone  cream (KENALOG ) 0.5 %, Apply topically., Disp: , Rfl:    gemfibrozil  (LOPID ) 600 MG tablet, TAKE 1 TABLET(600 MG) BY MOUTH TWICE DAILY (Patient not taking: Reported on 08/30/2023), Disp: 180 tablet, Rfl: 3   Objective:     BP 122/72 (BP Location: Right Arm, Patient Position: Sitting, Cuff Size: Normal)   Pulse 64   Temp 97.6 F (36.4 C) (Temporal)   Ht 5' (1.524 m)   Wt 126 lb 6.4 oz (57.3 kg)   SpO2 96%   BMI 24.69 kg/m    Physical Exam Constitutional:      General: He is not in acute distress.    Appearance: Normal appearance. He is not ill-appearing, toxic-appearing or diaphoretic.  HENT:     Head: Normocephalic and atraumatic.  Right Ear: External ear normal.     Left Ear: Tympanic membrane, ear canal and external ear normal.     Ears:      Mouth/Throat:     Mouth: Mucous membranes are moist.     Pharynx: Oropharynx is clear. No oropharyngeal exudate or posterior oropharyngeal erythema.  Eyes:     General: No scleral icterus.       Right eye: No discharge.        Left eye: No discharge.     Extraocular Movements: Extraocular movements intact.     Conjunctiva/sclera: Conjunctivae normal.     Pupils: Pupils are equal, round, and reactive to light.  Cardiovascular:     Rate and Rhythm: Normal rate and regular rhythm.  Pulmonary:     Effort: Pulmonary effort is normal. No respiratory distress.     Breath sounds: Normal breath sounds.  Abdominal:     General: Bowel sounds  are normal.     Tenderness: There is no abdominal tenderness. There is no guarding.  Musculoskeletal:     Cervical back: No rigidity or tenderness.  Skin:    General: Skin is warm and dry.  Neurological:     Mental Status: He is alert and oriented to person, place, and time.  Psychiatric:        Mood and Affect: Mood normal.        Behavior: Behavior normal.      No results found for any visits on 01/02/24.    The ASCVD Risk score (Arnett DK, et al., 2019) failed to calculate for the following reasons:   The valid total cholesterol range is 130 to 320 mg/dL    Assessment & Plan:   Healthcare maintenance -     CBC -     Urinalysis, Routine w reflex microscopic  High triglycerides -     Comprehensive metabolic panel with GFR -     Lipid panel  Cigarette smoker  Immunization due -     Flu vaccine trivalent PF, 6mos and older(Flulaval,Afluria,Fluarix,Fluzone)  Screening for prostate cancer -     PSA    Return in about 6 months (around 07/02/2024).  Information was given on health maintenance and disease prevention.  Again encouraged him to quit smoking.  Information given on quitting.  Congratulated him on his sobriety.  Thrombocytopenia is stable and likely associated with his past history of alcohol abuse.  Elsie Sim Lent, MD

## 2024-01-30 ENCOUNTER — Other Ambulatory Visit: Payer: Self-pay

## 2024-01-30 ENCOUNTER — Ambulatory Visit (INDEPENDENT_AMBULATORY_CARE_PROVIDER_SITE_OTHER): Admitting: Internal Medicine

## 2024-01-30 ENCOUNTER — Encounter: Payer: Self-pay | Admitting: Internal Medicine

## 2024-01-30 VITALS — BP 134/76 | HR 68 | Ht 60.0 in | Wt 128.0 lb

## 2024-01-30 DIAGNOSIS — B181 Chronic viral hepatitis B without delta-agent: Secondary | ICD-10-CM | POA: Diagnosis not present

## 2024-01-30 DIAGNOSIS — Z79899 Other long term (current) drug therapy: Secondary | ICD-10-CM | POA: Diagnosis not present

## 2024-01-30 DIAGNOSIS — D696 Thrombocytopenia, unspecified: Secondary | ICD-10-CM

## 2024-01-31 ENCOUNTER — Other Ambulatory Visit: Payer: Self-pay | Admitting: Gastroenterology

## 2024-02-03 LAB — AFP TUMOR MARKER: AFP-Tumor Marker: 5.7 ng/mL (ref <6.1)

## 2024-02-03 LAB — HEPATITIS B DNA, ULTRAQUANTITATIVE, PCR
Hepatitis B DNA: NOT DETECTED [IU]/mL
Hepatitis B virus DNA: NOT DETECTED {Log_IU}/mL

## 2024-02-09 NOTE — Progress Notes (Signed)
 [name]: follow up for chronic hepatitis b Patient ID: Gregory Singh, male   DOB: 1960-08-28, 63 y.o.   MRN: [id number]  HPI 63yo M with chronic hepatitis b without hepatic coma, continues to take tenofovir . Without missing doses. He is doing well overall.  Outpatient Encounter Medications as of 01/30/2024  Medication Sig   b complex vitamins capsule Take 1 capsule by mouth daily.   clobetasol  ointment (TEMOVATE ) 0.05 % Apply bid to rash on leg liberally, do not use on face or genitals   ferrous sulfate  325 (65 FE) MG EC tablet Take 1 tablet (325 mg total) by mouth 2 (two) times daily.   Multiple Vitamins-Minerals (MULTIVITAMIN WITH IRON-MINERALS) liquid Take by mouth daily.   omega-3 acid ethyl esters (LOVAZA ) 1 g capsule Take 2 capsules (2 g total) by mouth daily.   pantoprazole  (PROTONIX ) 40 MG tablet Take 1 tablet (40 mg total) by mouth 2 (two) times daily before a meal.   sildenafil  (VIAGRA ) 100 MG tablet Take 0.5-1 tablets (50-100 mg total) by mouth daily as needed for erectile dysfunction.   tenofovir  (VIREAD ) 300 MG tablet Take 1 tablet (300 mg total) by mouth daily.   triamcinolone  cream (KENALOG ) 0.5 % Apply topically.   [DISCONTINUED] folic acid  (FOLVITE ) 1 MG tablet Take 1 tablet (1 mg total) by mouth daily.   gemfibrozil  (LOPID ) 600 MG tablet TAKE 1 TABLET(600 MG) BY MOUTH TWICE DAILY (Patient not taking: Reported on 01/30/2024)   No facility-administered encounter medications on file as of 01/30/2024.     Patient Active Problem List   Diagnosis Date Noted   History of colonic polyps 05/07/2023   Folic acid  deficiency 04/09/2023   Visit for suture removal 04/09/2023   Hospital discharge follow-up 04/09/2023   Upper GI bleed 03/28/2023   Hematemesis 03/28/2023   Syncope, vasovagal 03/28/2023   Acute blood loss anemia 03/28/2023   Elevated troponin 03/28/2023   Cirrhosis (HCC) 03/28/2023   COPD  GOLD 0/ active smoker 11/08/2020   Cigarette smoker 11/08/2020   Hepatitis B  01/19/2015   Thrombocytopenia 01/10/2015   Tremor of both hands 01/10/2015   High triglycerides 01/10/2015   Neck pain 05/21/2011   Shoulder pain 05/21/2011   Neck pain on right side 05/15/2011     Health Maintenance Due  Topic Date Due   Hepatitis B Vaccines 19-59 Average Risk (1 of 3 - Risk 3-dose series) 08/26/2022     Review of Systems Review of Systems  Constitutional: Negative for fever, chills, diaphoresis, activity change, appetite change, fatigue and unexpected weight change.  HENT: Negative for congestion, sore throat, rhinorrhea, sneezing, trouble swallowing and sinus pressure.  Eyes: Negative for photophobia and visual disturbance.  Respiratory: Negative for cough, chest tightness, shortness of breath, wheezing and stridor.  Cardiovascular: Negative for chest pain, palpitations and leg swelling.  Gastrointestinal: Negative for nausea, vomiting, abdominal pain, diarrhea, constipation, blood in stool, abdominal distention and anal bleeding.  Genitourinary: Negative for dysuria, hematuria, flank pain and difficulty urinating.  Musculoskeletal: Negative for myalgias, back pain, joint swelling, arthralgias and gait problem.  Skin: Negative for color change, pallor, rash and wound.  Neurological: Negative for dizziness, tremors, weakness and light-headedness.  Hematological: Negative for adenopathy. Does not bruise/bleed easily.  Psychiatric/Behavioral: Negative for behavioral problems, confusion, sleep disturbance, dysphoric mood, decreased concentration and agitation.   Physical Exam   BP 134/76   Pulse 68   Ht 5' (1.524 m)   Wt 128 lb (58.1 kg)   SpO2 96%   BMI  25.00 kg/m    Physical Exam  Constitutional: He is oriented to person, place, and time. He appears well-developed and well-nourished. No distress.  HENT:  Mouth/Throat: Oropharynx is clear and moist. No oropharyngeal exudate.  Cardiovascular: Normal rate, regular rhythm and normal heart sounds. Exam reveals  no gallop and no friction rub.  No murmur heard.  Pulmonary/Chest: Effort normal and breath sounds normal. No respiratory distress. He has no wheezes.  Abdominal: Soft. Bowel sounds are normal. He exhibits no distension. There is no tenderness.  Lymphadenopathy:  He has no cervical adenopathy.  Neurological: He is alert and oriented to person, place, and time.  Skin: Skin is warm and dry. No rash noted. No erythema.  Psychiatric: He has a normal mood and affect. His behavior is normal.    CBC Lab Results  Component Value Date   WBC 5.0 01/02/2024   RBC 4.66 01/02/2024   HGB 15.1 01/02/2024   HCT 45.2 01/02/2024   PLT 86.0 (L) 01/02/2024   MCV 97.0 01/02/2024   MCH 36.0 (H) 03/30/2023   MCHC 33.5 01/02/2024   RDW 15.4 01/02/2024   LYMPHSABS 1.8 07/01/2023   MONOABS 0.5 07/01/2023   EOSABS 0.1 07/01/2023    BMET Lab Results  Component Value Date   NA 143 01/02/2024   K 3.8 01/02/2024   CL 106 01/02/2024   CO2 26 01/02/2024   GLUCOSE 94 01/02/2024   BUN 16 01/02/2024   CREATININE 0.90 01/02/2024   CALCIUM  9.7 01/02/2024   GFRNONAA >60 03/29/2023   GFRAA 115 11/26/2018      Assessment and Plan  Chronic hepatitis b without hepatic coma = continue on tenofovir . Plan to check hep b VL; will also get RUQ U/S for [facility] surveillance  Long term medication management = will check cbc and cmp to see still tolerating tenofovir    Thrombocytopenia = due to liver disease

## 2024-02-10 ENCOUNTER — Ambulatory Visit
Admission: RE | Admit: 2024-02-10 | Discharge: 2024-02-10 | Disposition: A | Discharge status: Home / Self Care | Source: Ambulatory Visit | Attending: Internal Medicine | Admitting: Internal Medicine

## 2024-02-10 DIAGNOSIS — B181 Chronic viral hepatitis B without delta-agent: Secondary | ICD-10-CM

## 2024-02-27 ENCOUNTER — Other Ambulatory Visit: Payer: Self-pay | Admitting: Family Medicine

## 2024-02-27 DIAGNOSIS — N5201 Erectile dysfunction due to arterial insufficiency: Secondary | ICD-10-CM

## 2024-02-28 ENCOUNTER — Other Ambulatory Visit: Payer: Self-pay | Admitting: Family Medicine

## 2024-02-28 DIAGNOSIS — N5201 Erectile dysfunction due to arterial insufficiency: Secondary | ICD-10-CM

## 2024-02-28 NOTE — Telephone Encounter (Unsigned)
 Copied from CRM #[id number]. Topic: Clinical - Medication Refill >> Feb 28, 2024 12:43 PM Aisha D wrote: Medication: sildenafil  (VIAGRA ) 100 MG tablet  Has the patient contacted their pharmacy? Yes (Agent: If no, request that the patient contact the pharmacy for the refill. If patient does not wish to contact the pharmacy document the reason why and proceed with request.) (Agent: If yes, when and what did the pharmacy advise?)  This is the patient's preferred pharmacy:  [facility] [address], [place] - [address] [place] [address] Phone: [phone] Fax: [phone]   Is this the correct pharmacy for this prescription? Yes If no, delete pharmacy and type the correct one.   Has the prescription been filled recently? No  Is the patient out of the medication? Yes  Has the patient been seen for an appointment in the last year OR does the patient have an upcoming appointment? Yes  Can we respond through MyChart? Yes  Agent: Please be advised that Rx refills may take up to 3 business days. We ask that you follow-up with your pharmacy.

## 2024-03-02 MED ORDER — SILDENAFIL CITRATE 100 MG PO TABS: 50.0000 mg | ORAL_TABLET | Freq: Every day | ORAL | 6 refills | Status: AC | PRN

## 2024-03-02 NOTE — Telephone Encounter (Signed)
 Refill requests for  Viagra  100 mg LR 07/01/23, #20, 6 rf LOV 01/02/24 FOV  07/02/24  Please review and advise.  Thanks. Dm/cma

## 2024-03-30 ENCOUNTER — Telehealth: Payer: Self-pay | Admitting: Internal Medicine

## 2024-03-30 NOTE — Telephone Encounter (Signed)
 Copied from CRM #[id number]. Topic: Appointments - Scheduling Inquiry for Clinic >> Mar 27, 2024 12:51 PM Devaughn RAMAN wrote: Reason for CRM: Pt calling to reschedule CT scan appt fot 1.26.26. Pt would like anyday around 8:30am. Please f/u with pt.

## 2024-04-13 ENCOUNTER — Other Ambulatory Visit

## 2024-04-15 ENCOUNTER — Inpatient Hospital Stay
Admission: RE | Admit: 2024-04-15 | Discharge: 2024-04-15 | Disposition: A | Source: Ambulatory Visit | Attending: Acute Care | Admitting: Acute Care

## 2024-04-15 DIAGNOSIS — Z122 Encounter for screening for malignant neoplasm of respiratory organs: Secondary | ICD-10-CM

## 2024-04-15 DIAGNOSIS — Z87891 Personal history of nicotine dependence: Secondary | ICD-10-CM

## 2024-04-15 DIAGNOSIS — F17210 Nicotine dependence, cigarettes, uncomplicated: Secondary | ICD-10-CM

## 2024-04-17 ENCOUNTER — Other Ambulatory Visit: Payer: Self-pay

## 2024-04-17 DIAGNOSIS — Z122 Encounter for screening for malignant neoplasm of respiratory organs: Secondary | ICD-10-CM

## 2024-04-17 DIAGNOSIS — F17210 Nicotine dependence, cigarettes, uncomplicated: Secondary | ICD-10-CM

## 2024-04-17 DIAGNOSIS — Z87891 Personal history of nicotine dependence: Secondary | ICD-10-CM

## 2024-04-19 ENCOUNTER — Other Ambulatory Visit: Payer: Self-pay | Admitting: Gastroenterology

## 2024-07-02 ENCOUNTER — Ambulatory Visit: Admitting: Family Medicine

## 2024-07-30 ENCOUNTER — Ambulatory Visit: Admitting: Internal Medicine
